# Patient Record
Sex: Female | Born: 1960 | Race: White | Hispanic: No | Marital: Married | Smoking: Never smoker
Health system: Southern US, Community
[De-identification: ages and names within clinical notes are randomized; demographics above are authoritative.]

## PROBLEM LIST (undated history)

## (undated) DIAGNOSIS — M329 Systemic lupus erythematosus, unspecified: Secondary | ICD-10-CM

## (undated) DIAGNOSIS — IMO0002 Reserved for concepts with insufficient information to code with codable children: Secondary | ICD-10-CM

## (undated) DIAGNOSIS — M1990 Unspecified osteoarthritis, unspecified site: Secondary | ICD-10-CM

## (undated) DIAGNOSIS — F329 Major depressive disorder, single episode, unspecified: Secondary | ICD-10-CM

## (undated) DIAGNOSIS — F419 Anxiety disorder, unspecified: Secondary | ICD-10-CM

## (undated) DIAGNOSIS — A419 Sepsis, unspecified organism: Secondary | ICD-10-CM

## (undated) DIAGNOSIS — E119 Type 2 diabetes mellitus without complications: Secondary | ICD-10-CM

## (undated) DIAGNOSIS — F32A Depression, unspecified: Secondary | ICD-10-CM

## (undated) DIAGNOSIS — Z87442 Personal history of urinary calculi: Secondary | ICD-10-CM

## (undated) DIAGNOSIS — U071 COVID-19: Secondary | ICD-10-CM

## (undated) DIAGNOSIS — T7840XA Allergy, unspecified, initial encounter: Secondary | ICD-10-CM

## (undated) DIAGNOSIS — J45909 Unspecified asthma, uncomplicated: Secondary | ICD-10-CM

## (undated) HISTORY — DX: Allergy, unspecified, initial encounter: T78.40XA

## (undated) HISTORY — DX: Unspecified osteoarthritis, unspecified site: M19.90

---
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
## 1990-12-03 HISTORY — PX: APPENDECTOMY: SHX54

## 2000-02-05 ENCOUNTER — Other Ambulatory Visit: Admission: RE | Admit: 2000-02-05 | Discharge: 2000-02-05 | Payer: Self-pay | Admitting: Obstetrics & Gynecology

## 2000-04-05 ENCOUNTER — Encounter: Payer: Self-pay | Admitting: Rheumatology

## 2000-04-05 ENCOUNTER — Encounter: Admission: RE | Admit: 2000-04-05 | Discharge: 2000-04-05 | Payer: Self-pay | Admitting: Rheumatology

## 2000-08-08 ENCOUNTER — Other Ambulatory Visit: Admission: RE | Admit: 2000-08-08 | Discharge: 2000-08-08 | Payer: Self-pay | Admitting: Obstetrics & Gynecology

## 2001-04-15 ENCOUNTER — Other Ambulatory Visit: Admission: RE | Admit: 2001-04-15 | Discharge: 2001-04-15 | Payer: Self-pay | Admitting: Obstetrics & Gynecology

## 2001-05-07 ENCOUNTER — Inpatient Hospital Stay (HOSPITAL_COMMUNITY): Admission: AD | Admit: 2001-05-07 | Discharge: 2001-05-09 | Payer: Self-pay | Admitting: Obstetrics & Gynecology

## 2002-07-14 ENCOUNTER — Encounter: Admission: RE | Admit: 2002-07-14 | Discharge: 2002-07-14 | Payer: Self-pay | Admitting: Obstetrics & Gynecology

## 2002-07-14 ENCOUNTER — Encounter: Payer: Self-pay | Admitting: Obstetrics & Gynecology

## 2002-12-03 HISTORY — PX: ABDOMINAL HYSTERECTOMY: SHX81

## 2003-01-28 ENCOUNTER — Encounter: Admission: RE | Admit: 2003-01-28 | Discharge: 2003-01-28 | Payer: Self-pay | Admitting: Obstetrics & Gynecology

## 2003-01-28 ENCOUNTER — Encounter: Payer: Self-pay | Admitting: Obstetrics & Gynecology

## 2003-09-01 ENCOUNTER — Encounter: Admission: RE | Admit: 2003-09-01 | Discharge: 2003-09-01 | Payer: Self-pay | Admitting: Obstetrics & Gynecology

## 2003-09-01 ENCOUNTER — Encounter: Payer: Self-pay | Admitting: Obstetrics & Gynecology

## 2005-12-03 HISTORY — PX: ROTATOR CUFF REPAIR: SHX139

## 2006-05-02 ENCOUNTER — Ambulatory Visit (HOSPITAL_COMMUNITY): Admission: RE | Admit: 2006-05-02 | Discharge: 2006-05-02 | Payer: Self-pay | Admitting: *Deleted

## 2006-05-07 ENCOUNTER — Encounter: Admission: RE | Admit: 2006-05-07 | Discharge: 2006-05-07 | Payer: Self-pay | Admitting: *Deleted

## 2006-05-17 ENCOUNTER — Ambulatory Visit (HOSPITAL_COMMUNITY): Admission: RE | Admit: 2006-05-17 | Discharge: 2006-05-17 | Payer: Self-pay | Admitting: *Deleted

## 2006-09-19 ENCOUNTER — Encounter: Admission: RE | Admit: 2006-09-19 | Discharge: 2006-12-18 | Payer: Self-pay | Admitting: *Deleted

## 2006-10-08 ENCOUNTER — Ambulatory Visit (HOSPITAL_COMMUNITY): Admission: RE | Admit: 2006-10-08 | Discharge: 2006-10-09 | Payer: Self-pay | Admitting: *Deleted

## 2006-12-31 IMAGING — CR DG CHEST 2V
2 series · 2 of 2 positions shown · non-contrast
Comparison: None.

CLINICAL DATA: Morbid obesity.  Preop radiograph.
 CHEST - 2 VIEW:

[w chest pa *]
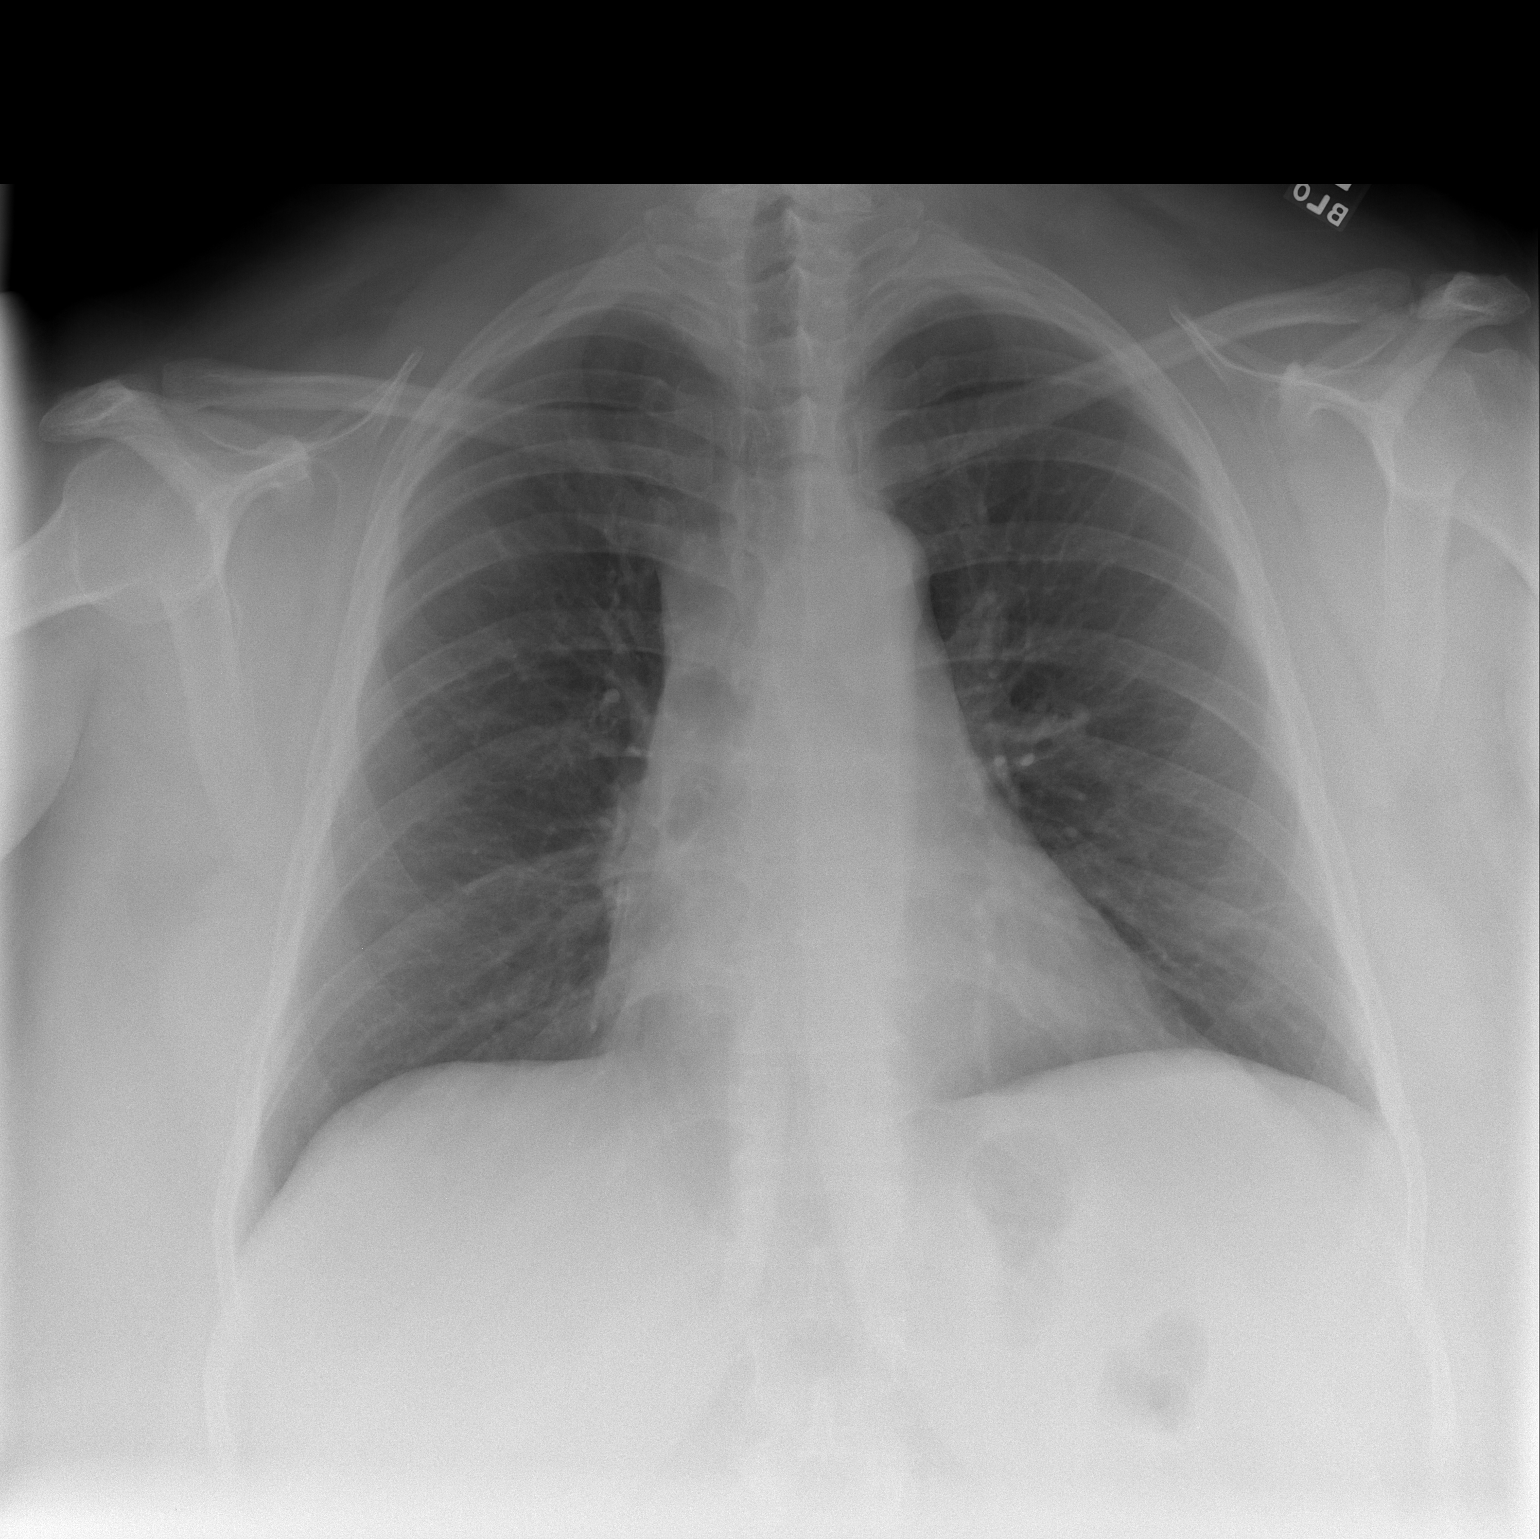

[w chest lat *]
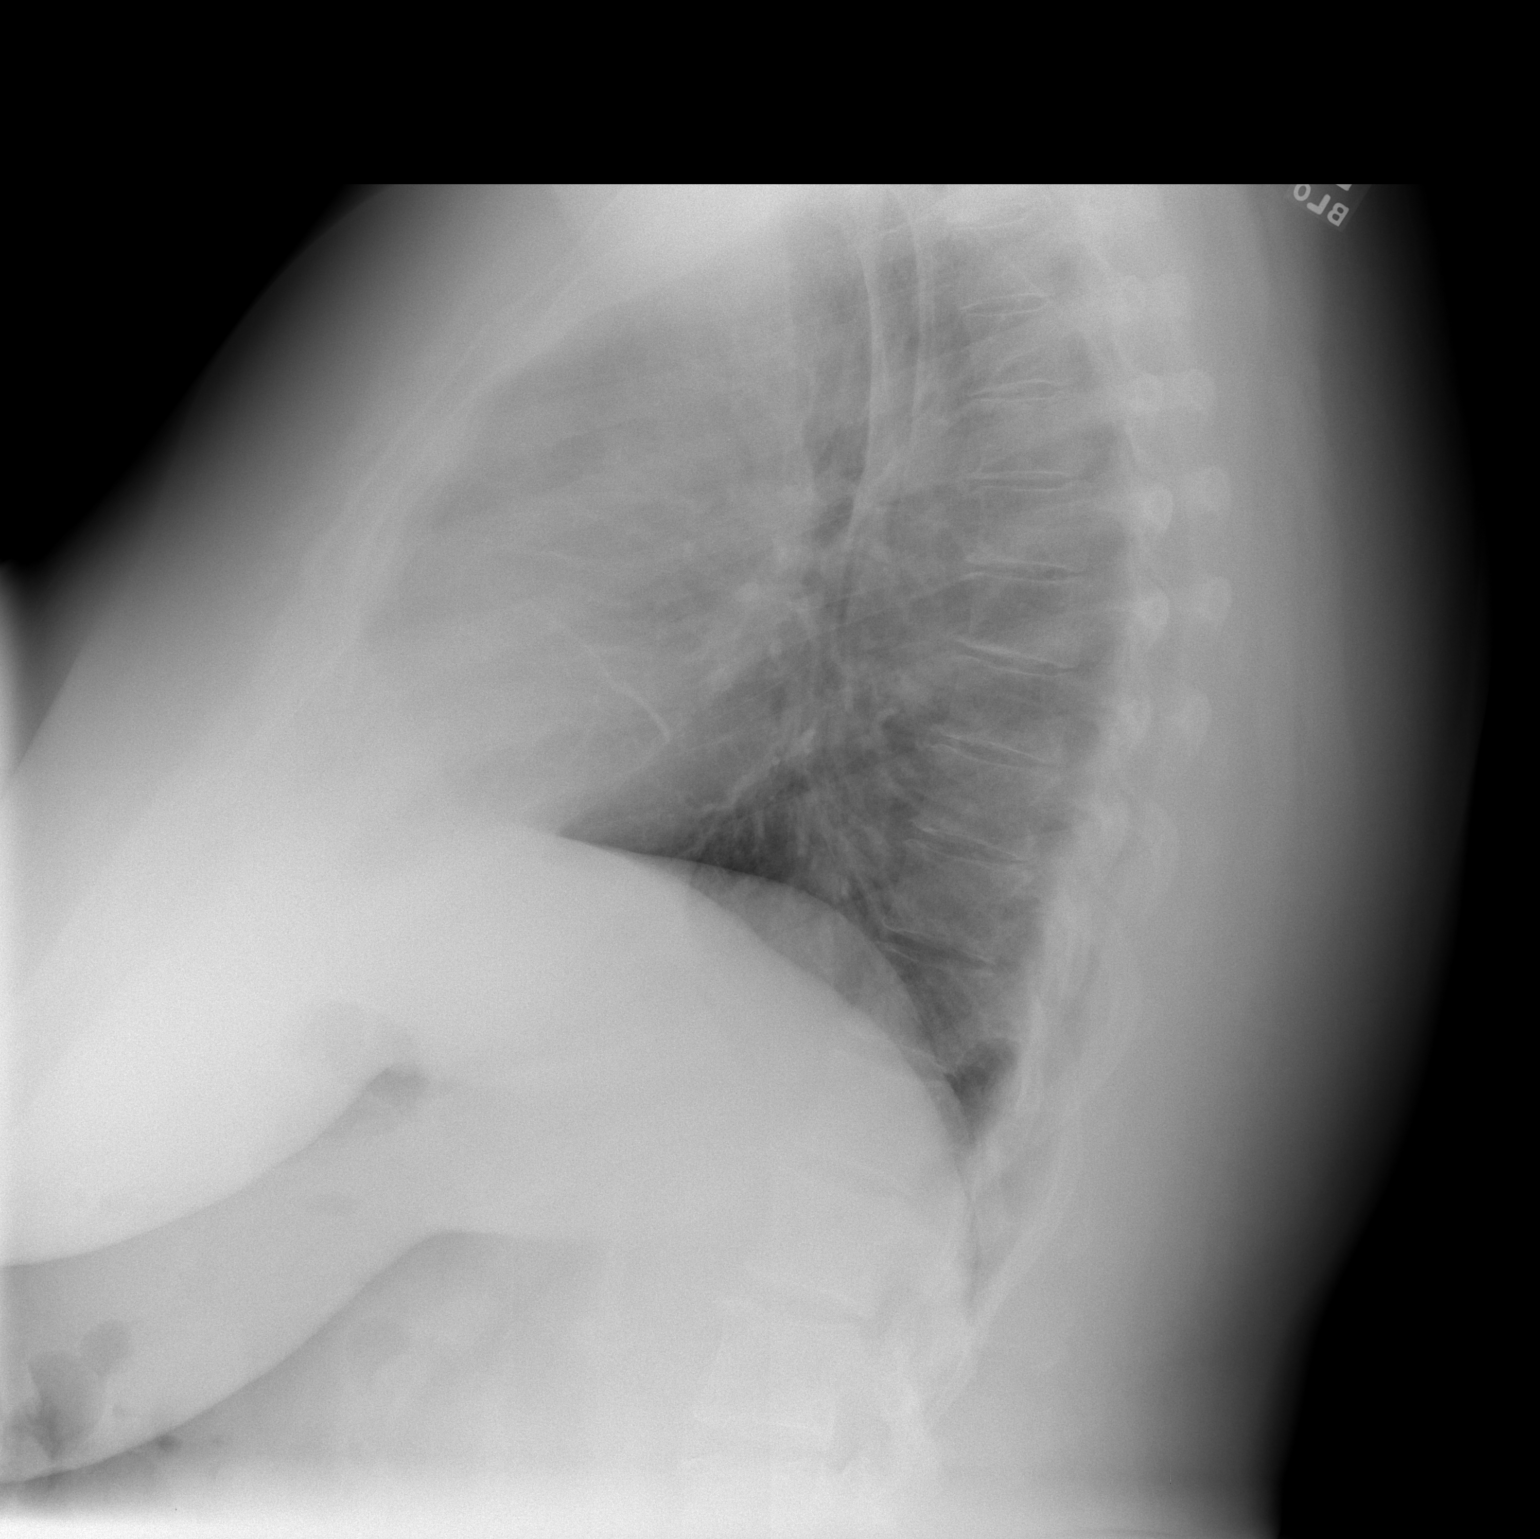

[2 of 2 positions shown; findings below may reference images not displayed]

FINDINGS: The heart size is normal.  There are no effusions.  No interstitial edema.  No airspace opacities are identified.
IMPRESSION: No active cardiopulmonary disease.

## 2007-01-21 ENCOUNTER — Encounter: Admission: RE | Admit: 2007-01-21 | Discharge: 2007-04-21 | Payer: Self-pay | Admitting: *Deleted

## 2012-08-25 ENCOUNTER — Telehealth (INDEPENDENT_AMBULATORY_CARE_PROVIDER_SITE_OTHER): Payer: Self-pay | Admitting: Surgery

## 2012-08-25 NOTE — Telephone Encounter (Signed)
I called the patient per Dr Martin's request concerning the lap band surgery with vagotomy. I left a voicemail asking the patient to return my call @ [phone].cef °

## 2014-08-09 ENCOUNTER — Emergency Department (HOSPITAL_COMMUNITY)
Admission: EM | Admit: 2014-08-09 | Discharge: 2014-08-09 | Disposition: A | Discharge status: Home / Self Care | Payer: BC Managed Care – PPO | Attending: Emergency Medicine | Admitting: Emergency Medicine

## 2014-08-09 ENCOUNTER — Encounter (HOSPITAL_COMMUNITY): Payer: Self-pay | Admitting: Emergency Medicine

## 2014-08-09 DIAGNOSIS — R109 Unspecified abdominal pain: Secondary | ICD-10-CM | POA: Diagnosis not present

## 2014-08-09 DIAGNOSIS — Z9889 Other specified postprocedural states: Secondary | ICD-10-CM | POA: Insufficient documentation

## 2014-08-09 DIAGNOSIS — Z90710 Acquired absence of both cervix and uterus: Secondary | ICD-10-CM | POA: Diagnosis not present

## 2014-08-09 DIAGNOSIS — I10 Essential (primary) hypertension: Secondary | ICD-10-CM | POA: Diagnosis not present

## 2014-08-09 DIAGNOSIS — Z8739 Personal history of other diseases of the musculoskeletal system and connective tissue: Secondary | ICD-10-CM | POA: Diagnosis not present

## 2014-08-09 DIAGNOSIS — Z79899 Other long term (current) drug therapy: Secondary | ICD-10-CM | POA: Diagnosis not present

## 2014-08-09 HISTORY — DX: Systemic lupus erythematosus, unspecified: M32.9

## 2014-08-09 HISTORY — DX: Reserved for concepts with insufficient information to code with codable children: IMO0002

## 2014-08-09 LAB — CBC WITH DIFFERENTIAL/PLATELET
Basophils Absolute: 0 10*3/uL (ref 0.0–0.1)
Basophils Relative: 1 % (ref 0–1)
Eosinophils Absolute: 0.1 10*3/uL (ref 0.0–0.7)
Eosinophils Relative: 2 % (ref 0–5)
HCT: 45.4 % (ref 36.0–46.0)
Hemoglobin: 16.3 g/dL — ABNORMAL HIGH (ref 12.0–15.0)
Lymphocytes Relative: 33 % (ref 12–46)
Lymphs Abs: 2.3 10*3/uL (ref 0.7–4.0)
MCH: 29 pg (ref 26.0–34.0)
MCHC: 35.9 g/dL (ref 30.0–36.0)
MCV: 80.6 fL (ref 78.0–100.0)
Monocytes Absolute: 0.5 10*3/uL (ref 0.1–1.0)
Monocytes Relative: 7 % (ref 3–12)
Neutro Abs: 4 10*3/uL (ref 1.7–7.7)
Neutrophils Relative %: 57 % (ref 43–77)
Platelets: 167 10*3/uL (ref 150–400)
RBC: 5.63 MIL/uL — ABNORMAL HIGH (ref 3.87–5.11)
RDW: 13.1 % (ref 11.5–15.5)
WBC: 7 10*3/uL (ref 4.0–10.5)

## 2014-08-09 LAB — COMPREHENSIVE METABOLIC PANEL
ALT: 61 U/L — ABNORMAL HIGH (ref 0–35)
AST: 52 U/L — ABNORMAL HIGH (ref 0–37)
Albumin: 4.1 g/dL (ref 3.5–5.2)
Alkaline Phosphatase: 77 U/L (ref 39–117)
Anion gap: 18 — ABNORMAL HIGH (ref 5–15)
BUN: 14 mg/dL (ref 6–23)
CO2: 21 mEq/L (ref 19–32)
Calcium: 9.7 mg/dL (ref 8.4–10.5)
Chloride: 97 mEq/L (ref 96–112)
Creatinine, Ser: 0.58 mg/dL (ref 0.50–1.10)
GFR calc Af Amer: >90 mL/min (ref >90)
GFR calc non Af Amer: >90 mL/min (ref >90)
Glucose, Bld: 254 mg/dL — ABNORMAL HIGH (ref 70–99)
Potassium: 4.2 mEq/L (ref 3.7–5.3)
Sodium: 136 mEq/L — ABNORMAL LOW (ref 137–147)
Total Bilirubin: 0.5 mg/dL (ref 0.3–1.2)
Total Protein: 8.5 g/dL — ABNORMAL HIGH (ref 6.0–8.3)

## 2014-08-09 LAB — URINALYSIS, ROUTINE W REFLEX MICROSCOPIC
BILIRUBIN URINE: NEGATIVE
Glucose, UA: >1000 mg/dL — AB
KETONES UR: NEGATIVE mg/dL
Leukocytes, UA: NEGATIVE
NITRITE: NEGATIVE
Protein, ur: NEGATIVE mg/dL
SPECIFIC GRAVITY, URINE: 1.017 (ref 1.005–1.030)
Urobilinogen, UA: 0.2 mg/dL (ref 0.0–1.0)
pH: 6 (ref 5.0–8.0)

## 2014-08-09 LAB — URINE MICROSCOPIC-ADD ON

## 2014-08-09 MED ORDER — HYDROMORPHONE HCL PF 1 MG/ML IJ SOLN
1.0000 mg | Freq: Once | INTRAMUSCULAR | Status: AC
  Administered 2014-08-09: 1 mg via INTRAVENOUS
  Filled 2014-08-09: qty 1

## 2014-08-09 MED ORDER — ONDANSETRON HCL 4 MG/2ML IJ SOLN
4.0000 mg | Freq: Once | INTRAMUSCULAR | Status: AC
  Administered 2014-08-09: 4 mg via INTRAVENOUS
  Filled 2014-08-09: qty 2

## 2014-08-09 MED ORDER — SODIUM CHLORIDE 0.9 % IV SOLN
1000.0000 mL | Freq: Once | INTRAVENOUS | Status: AC
  Administered 2014-08-09: 1000 mL via INTRAVENOUS

## 2014-08-09 MED ORDER — OXYCODONE-ACETAMINOPHEN 5-325 MG PO TABS: 1.0000 | ORAL_TABLET | Freq: Four times a day (QID) | ORAL | Status: DC | PRN

## 2014-08-09 MED ORDER — FENTANYL CITRATE 0.05 MG/ML IJ SOLN
50.0000 ug | Freq: Once | INTRAMUSCULAR | Status: AC
  Administered 2014-08-09: 50 ug via INTRAVENOUS
  Filled 2014-08-09: qty 2

## 2014-08-09 MED ORDER — ONDANSETRON HCL 4 MG PO TABS: 4.0000 mg | ORAL_TABLET | Freq: Four times a day (QID) | ORAL | Status: DC

## 2014-08-09 NOTE — Discharge Instructions (Signed)
Flank Pain Flank pain refers to pain that is located on the side of the body between the upper abdomen and the back. The pain may occur over a short period of time (acute) or may be long-term or reoccurring (chronic). It may be mild or severe. Flank pain can be caused by many things. CAUSES  Some of the more common causes of flank pain include:  Muscle strains.   Muscle spasms.   A disease of your spine (vertebral disk disease).   A lung infection (pneumonia).   Fluid around your lungs (pulmonary edema).   A kidney infection.   Kidney stones.   A very painful skin rash caused by the chickenpox virus (shingles).   Gallbladder disease.  HOME CARE INSTRUCTIONS  Home care will depend on the cause of your pain. In general,  Rest as directed by your caregiver.  Drink enough fluids to keep your urine clear or pale yellow.  Only take over-the-counter or prescription medicines as directed by your caregiver. Some medicines may help relieve the pain.  Tell your caregiver about any changes in your pain.  Follow up with your caregiver as directed. SEEK IMMEDIATE MEDICAL CARE IF:   Your pain is not controlled with medicine.   You have new or worsening symptoms.  Your pain increases.   You have abdominal pain.   You have shortness of breath.   You have persistent nausea or vomiting.   You have swelling in your abdomen.   You feel faint or pass out.   You have blood in your urine.  You have a fever or persistent symptoms for more than 2-3 days.  You have a fever and your symptoms suddenly get worse. MAKE SURE YOU:   Understand these instructions.  Will watch your condition.  Will get help right away if you are not doing well or get worse. Document Released: 01/10/2006 Document Revised: 08/13/2012 Document Reviewed: 07/03/2012 [facility] Patient Information 2015 [place], [place]. This information is not intended to replace advice given to you by your  health care provider. Make sure you discuss any questions you have with your health care provider.   Hypertension Hypertension is another name for high blood pressure. High blood pressure forces your heart to work harder to pump blood. A blood pressure reading has two numbers, which includes a higher number over a lower number (example: 110/72). HOME CARE   Have your blood pressure rechecked by your doctor.  Only take medicine as told by your doctor. Follow the directions carefully. The medicine does not work as well if you skip doses. Skipping doses also puts you at risk for problems.  Do not smoke.  Monitor your blood pressure at home as told by your doctor. GET HELP IF:  You think you are having a reaction to the medicine you are taking.  You have repeat headaches or feel dizzy.  You have puffiness (swelling) in your ankles.  You have trouble with your vision. GET HELP RIGHT AWAY IF:   You get a very bad headache and are confused.  You feel weak, numb, or faint.  You get chest or belly (abdominal) pain.  You throw up (vomit).  You cannot breathe very well. MAKE SURE YOU:   Understand these instructions.  Will watch your condition.  Will get help right away if you are not doing well or get worse. Document Released: 05/07/2008 Document Revised: 11/24/2013 Document Reviewed: 09/11/2013 [facility] Patient Information 2015 [place], [place]. This information is not intended to replace advice  given to you by your health care provider. Make sure you discuss any questions you have with your health care provider.    Emergency Department Resource Guide 1) Find a Doctor and Pay Out of Pocket Although you won't have to find out who is covered by your insurance plan, it is a good idea to ask around and get recommendations. You will then need to call the office and see if the doctor you have chosen will accept you as a new patient and what types of options they offer for patients who  are self-pay. Some doctors offer discounts or will set up payment plans for their patients who do not have insurance, but you will need to ask so you aren't surprised when you get to your appointment.  2) Contact Your Local Health Department Not all health departments have doctors that can see patients for sick visits, but many do, so it is worth a call to see if yours does. If you don't know where your local health department is, you can check in your phone book. The CDC also has a tool to help you locate your state's health department, and many state websites also have listings of all of their local health departments.  3) Find a Walk-in Clinic If your illness is not likely to be very severe or complicated, you may want to try a walk in clinic. These are popping up all over the country in pharmacies, drugstores, and shopping centers. They're usually staffed by nurse practitioners or physician assistants that have been trained to treat common illnesses and complaints. They're usually fairly quick and inexpensive. However, if you have serious medical issues or chronic medical problems, these are probably not your best option.  No Primary Care Doctor: - Call Health Connect at  [phone] - they can help you locate a primary care doctor that  accepts your insurance, provides certain services, etc. - Physician Referral Service- [phone]  Chronic Pain Problems: Organization         Address  Phone   Notes  [facility]  [phone] Patients need to be referred by their primary care doctor.   Medication Assistance: Organization         Address  Phone   Notes  [facility] [address] [place] [place], [place] [address] [phone] --Must be a resident of [facility] -- Must have NO insurance coverage whatsoever (no Medicaid/ Medicare, etc.) -- The pt. MUST have a primary care doctor that directs their care regularly and follows  them in the community   [facility]ist  [phone]   [employer]  [phone]    Agencies that provide inexpensive medical care: Organization         Address  Phone   Notes  [facility]  [phone]   [facility]    [phone]   [facility] [address] [place], [place] [address] [phone]   [facility] [address] [place], [place] [phone]   Planned Parenthood    [phone]   [facility]    [phone]   Community Health and [facility]  [address], [place] Phone:  [phone], Fax:  [phone] Hours of Operation:  9 am - 6 pm, M-F.  Also accepts Medicaid/Medicare and self-pay.  [facility]  [address], [place] Phone: [phone])  [phone], Fax: [phone]. Hours of Operation:  8:30 am - 5:30 pm, M-F.  Also accepts Medicaid and self-pay.  [facility] [place] [address] [place] Phone: [phone]   [facility] [address] Natasha Bence [place], [place] [phone], Ext. [phone] Mondays & Thursdays: 7-9 AM.  First 15 patients are seen on a first come, first serve basis.    Medicaid-accepting [facility] Providers:  Organization         Address  Phone   Notes  [facility] [address], [place] [phone] Also accepts self-pay patients.  [facility] [address] Laurell Josephs [place], [place]  [phone]   [facility] [address], [place] [phone]   [facility] [address], [place] [phone]   Renaye Rakers [address], [place]   [phone] Only accepts [place] Access [place] patients after they have their name applied to their card.   Self-Pay (no insurance) in [facility]:  Organization         Address  Phone   Notes  Sickle Cell  Patients, [facility] [address] [place], [place] [phone]   [facility] [address] [place], [place] [phone]   [facility] [place]  [address], [place] [phone]   [facility]/Dr. Osei-Bonsu  [address], [place] or [address], [place] [phone] Phone number for both [place] and [place] locations is the same.  [facility] [address] [place] [phone]   [facility] [address], [place] or [address] [phone]   [facility] [address], [place] [phone], phone; [phone], fax Sees patients 1st and 3rd Saturday of every month.  Must not qualify for public or private insurance (i.e. Medicaid, Medicare, [place] Health Choice, Veterans' Benefits)  Household income should be no more than 200% of the poverty level The clinic cannot treat you if you are pregnant or think you are pregnant  Sexually transmitted diseases are not treated at the clinic.    Dental Care: Organization         Address  Phone  Notes  [facility] Department of [facility] [address] [place], [place] [phone] Accepts children up to age 70 who are enrolled in [place] or [place] Health Choice; pregnant women with a Medicaid card; and children who have applied for Medicaid or [place] Health Choice, but were declined, whose parents can pay a reduced fee at time of service.  [facility] of [facility]  [address], [place] [phone] Accepts children up to age 27 who are enrolled in [place] or [place] Health Choice; pregnant women with a Medicaid card; and children who have applied for Medicaid or [place] Health Choice, but were declined, whose parents can pay a reduced fee at time of service.  [facility]  [address] [place], [place] [phone] Patients are seen by appointment only. Walk-ins are not accepted. [facility] will see patients 30 years of age and older. Monday - Tuesday (8am-5pm) Most Wednesdays (8:30-5pm) $30 per visit, cash only  [facility]  [address], [employer]  [place] [phone] Patients are seen by appointment only. Walk-ins are not accepted. [facility] will see patients 24 years of age and older. One Wednesday Evening (Monthly: Volunteer Based).  $30 per visit, cash only  [employer]  [phone] for adults; Children under age 64, call Graduate Pediatric Dentistry at [phone]. Children aged 73-14, please call [phone] to request a pediatric application.  Dental services are provided in all areas of dental care including fillings, crowns and bridges, complete and partial dentures, implants, gum treatment, root canals, and extractions. Preventive care is also provided. Treatment is provided to both adults and children. Patients are selected via a lottery and there is often a waiting list.   [facility] [address] [place]  [phone] [email or web address]   [facility] [address] [place], [place] [phone], Ext. [phone] Second and Fourth Thursday of each month, opens at 6:30 AM; Clinic ends at 9 AM.  Patients are seen on a first-come first-served basis, and a limited number are seen during each clinic.   [facility]  [address] Ether [place], [place] [phone]   Eligibility Requirements You must have lived in [place], [place], or [place] counties for at least the last three months.   You cannot be eligible for state or federal sponsored [place], including [employer], [place], or [employer].   You generally cannot be eligible for healthcare insurance through your employer.    How to apply: Eligibility  screenings are held every Tuesday and Wednesday afternoon from 1:00 pm until 4:00 pm. You do not need an appointment for the interview!  [facility] [address] [place], [place] [phone]   [facility] Health Department  [phone]   [facility] Health Department  [phone]   [facility] Health Department  [phone]    Behavioral Health Resources in the Community: Intensive Outpatient Programs Organization         Address  Phone  Notes  [facility] Services [address] [place], [place] [phone]   [facility] Outpatient [address], [place], [place] [phone]   [facility] [address] [place], [place]  [phone]   [facility] Mental Health [address],  [place], [place] [phone] or [phone]   Substance Abuse Resources Organization         Address  Phone  Notes  [facility]  [phone]   [facility]  [phone]   [facility] [place]  [phone]   Floydene Flock  [phone]   Residential & Outpatient Substance Abuse Program  [phone]   Psychological Services Organization         Address  Phone  Notes  [facility] Behavioral Health  [phone]   [facility] Services  [phone]   [facility] Mental Health [address], [place] [phone] or [phone]    Mobile Crisis Teams Organization         Address  Phone  Notes  [facility], Mobile Crisis Care Unit  [phone]   [facility]  [address] [place], [place] [phone]   Doristine Locks [address] [place] [phone]    Self-Help/Support Groups Organization         Address  Phone             Notes  [facility] [place] - variety of support groups  [phone]- [phone] Call for more information  Narcotics Anonymous (NA), [facility] [address], [employer] [place]  2 meetings at  this location   Residential Sports administrator         Address  Phone  Notes  [facility] [address],    [place]  [phone]   [facility]  [address], [place] [address], [place], [place] [phone]   [facility] [address] [place], [place] [phone] Admissions: 8am-3pm M-F  [facility] [address],    [place], [place] [phone]   [facility] [address] [place], [place], [place] [phone]   [facility] [address],  [place], [place] [phone]   [facility] - Intensive Outpatient [address] Laurell Josephs [address], [place], [place] [phone]   [facility] ([facility] [address] [place].,  [place], [place] [phone] or [phone]   [facility] ([facility]) [address], [place], [place] [phone] Accepts Medicaid  [place] [address]  [place] [phone] Substance Abuse/Addiction Treatment   [facility] Organization         Address  Phone  Notes  [facility]  [phone]   Angie Fava, PhD [address] Ervin Knack [place], [place]   [phone] or [phone]   [facility]   [address] [place], [place] [phone]   [facility] [address] [place], [place] [phone] Insurance/Medicaid/sponsorship through [facility] and Families [address]                                    [place], [place] [phone] Therapy/tele-psych/case  [facility] [address], [place] [phone]    Dr. Lolly Mustache  [phone]   [facility]  [employer] [facility] Dept. 1) [address] [place] 2) [address], [place] 3)  [address], [place] [phone]  [phone]   [facility] Child Abuse Hotline [phone])803-4176 or [phone] (After Hours)

## 2014-08-09 NOTE — ED Provider Notes (Signed)
CSN: [id number]     Arrival date & time 08/09/14  0535 History   First MD Initiated Contact with Patient 08/09/14 [phone]     Chief Complaint  Patient presents with  . Flank Pain     (Consider location/radiation/quality/duration/timing/severity/associated sxs/prior Treatment) HPI Comments: Patient is a 53 year old female with history of lupus and kidney stones who presents to the ED today for evaluation of sudden onset of left-sided flank pain. She reports that the pain began around 2 AM. The pain starts in her left back and radiates into her left lower quadrant. It is a sharp, squeezing, twisting pain. She has not taken any medications to improve her pain. She has history of kidney stones, but patient is not in as much pain for her prior kidney stone. She denies any urinary symptoms including burning, urgency, frequency. She denies any history of documented hypertension. She states "I have very bad white coat hypertension". No headache, visual disturbance. She denies any history of diabetes.  Patient is a 53 y.o. female presenting with flank pain. The history is provided by the patient. No language interpreter was used.  Flank Pain Associated symptoms include abdominal pain. Pertinent negatives include no chest pain, chills, fever, headaches, nausea or vomiting.    Past Medical History  Diagnosis Date  . Lupus    History reviewed. No pertinent past surgical history. History reviewed. No pertinent family history. History  Substance Use Topics  . Smoking status: Never Smoker   . Smokeless tobacco: Not on file  . Alcohol Use: No   OB History   Grav Para Term Preterm Abortions TAB SAB Ect Mult Living                 Review of Systems  Constitutional: Negative for fever and chills.  Respiratory: Negative for shortness of breath.   Cardiovascular: Negative for chest pain.  Gastrointestinal: Positive for abdominal pain. Negative for nausea, vomiting and diarrhea.  Genitourinary: Positive  for flank pain. Negative for dysuria, vaginal bleeding, vaginal discharge and vaginal pain.  Neurological: Negative for dizziness, light-headedness and headaches.  All other systems reviewed and are negative.     Allergies  Codeine and Vibramycin  Home Medications   Prior to Admission medications   Medication Sig Start Date End Date Taking? Authorizing Provider  ALPRAZolam Prudy Feeler) 0.5 MG tablet Take 0.5 mg by mouth at bedtime as needed for anxiety.   Yes Historical Provider, MD  cetirizine (ZYRTEC) 10 MG tablet Take 10 mg by mouth daily.   Yes Historical Provider, MD  sertraline (ZOLOFT) 100 MG tablet Take 100 mg by mouth daily.   Yes Historical Provider, MD   BP 149/85  Pulse 91  Temp(Src) 98.2 F (36.8 C) (Oral)  Resp 18  SpO2 99% Physical Exam  Nursing note and vitals reviewed. Constitutional: She is oriented to person, place, and time. She appears well-developed and well-nourished. She appears distressed.  Patient very uncomfortable in appearance  HENT:  Head: Normocephalic and atraumatic.  Right Ear: External ear normal.  Left Ear: External ear normal.  Nose: Nose normal.  Mouth/Throat: Oropharynx is clear and moist.  Eyes: Conjunctivae are normal.  Neck: Normal range of motion.  Cardiovascular: Normal rate, regular rhythm and normal heart sounds.   Pulmonary/Chest: Effort normal and breath sounds normal. No stridor. No respiratory distress. She has no wheezes. She has no rales.  Abdominal: Soft. She exhibits no distension. There is no tenderness. There is no rigidity, no guarding and no CVA tenderness.  Musculoskeletal: Normal range of motion.  Minimal tenderness to palpation over left flank. No rashes or lesions.   Neurological: She is alert and oriented to person, place, and time. She has normal strength.  Skin: Skin is warm and dry. She is not diaphoretic. No erythema.  Psychiatric: She has a normal mood and affect. Her behavior is normal.    ED Course   Procedures (including critical care time) Labs Review Labs Reviewed  URINALYSIS, ROUTINE W REFLEX MICROSCOPIC - Abnormal; Notable for the following:    Glucose, UA >1000 (*)    Hgb urine dipstick LARGE (*)    All other components within normal limits  COMPREHENSIVE METABOLIC PANEL - Abnormal; Notable for the following:    Sodium 136 (*)    Glucose, Bld 254 (*)    Total Protein 8.5 (*)    AST 52 (*)    ALT 61 (*)    Anion gap 18 (*)    All other components within normal limits  CBC WITH DIFFERENTIAL - Abnormal; Notable for the following:    RBC 5.63 (*)    Hemoglobin 16.3 (*)    All other components within normal limits  URINE MICROSCOPIC-ADD ON    Imaging Review No results found.   EKG Interpretation None      MDM   Final diagnoses:  Left flank pain  Essential hypertension    Patient presents emergency Department with sudden onset, severe left flank pain. Large amount of hemoglobin in urine. Patient feels significantly improved after IV narcotics. Patient has history of kidney stones. My clinical suspicion is the patient has passed a stone. Patient was offered imaging which she declined at this time. Patient was glucose of 254. Patient will followup with primary care physician about hypertension and diabetes. Patient understands importance of doing this. Discussed reasons to return to emergency Department immediately. Vital signs stable for discharge. Discussed case with Dr. Juleen [place] who agrees with plan. Patient / Family / Caregiver informed of clinical course, understand medical decision-making process, and agree with plan.    Mora Bellman, PA-C 08/09/14 [phone]

## 2014-08-09 NOTE — ED Notes (Signed)
Unable to urinate at this time.  

## 2014-08-09 NOTE — ED Notes (Signed)
Pt unable to void at this time. 

## 2014-08-09 NOTE — ED Notes (Signed)
Pt arrived to the ED with a complaint of left sided abdominal pain.  Pt was awoke at 0200 with severe pain that sometimes radiates to the lower left abdomen.

## 2014-08-10 ENCOUNTER — Emergency Department (HOSPITAL_COMMUNITY)
Admission: EM | Admit: 2014-08-10 | Discharge: 2014-08-11 | Disposition: A | Discharge status: Home / Self Care | Payer: BC Managed Care – PPO | Attending: Emergency Medicine | Admitting: Emergency Medicine

## 2014-08-10 ENCOUNTER — Encounter (HOSPITAL_COMMUNITY): Payer: Self-pay | Admitting: Emergency Medicine

## 2014-08-10 ENCOUNTER — Emergency Department (HOSPITAL_COMMUNITY): Payer: BC Managed Care – PPO

## 2014-08-10 DIAGNOSIS — Z8739 Personal history of other diseases of the musculoskeletal system and connective tissue: Secondary | ICD-10-CM | POA: Insufficient documentation

## 2014-08-10 DIAGNOSIS — N209 Urinary calculus, unspecified: Secondary | ICD-10-CM

## 2014-08-10 DIAGNOSIS — R109 Unspecified abdominal pain: Secondary | ICD-10-CM | POA: Insufficient documentation

## 2014-08-10 DIAGNOSIS — Z79899 Other long term (current) drug therapy: Secondary | ICD-10-CM | POA: Insufficient documentation

## 2014-08-10 LAB — URINE MICROSCOPIC-ADD ON

## 2014-08-10 LAB — CBC WITH DIFFERENTIAL/PLATELET
BASOS ABS: 0 10*3/uL (ref 0.0–0.1)
Basophils Relative: 0 % (ref 0–1)
EOS PCT: 1 % (ref 0–5)
Eosinophils Absolute: 0.1 10*3/uL (ref 0.0–0.7)
HCT: 44.2 % (ref 36.0–46.0)
HEMOGLOBIN: 15.4 g/dL — AB (ref 12.0–15.0)
LYMPHS ABS: 2.1 10*3/uL (ref 0.7–4.0)
LYMPHS PCT: 24 % (ref 12–46)
MCH: 28.6 pg (ref 26.0–34.0)
MCHC: 34.8 g/dL (ref 30.0–36.0)
MCV: 82.2 fL (ref 78.0–100.0)
MONO ABS: 0.5 10*3/uL (ref 0.1–1.0)
Monocytes Relative: 6 % (ref 3–12)
NEUTROS ABS: 6 10*3/uL (ref 1.7–7.7)
NEUTROS PCT: 69 % (ref 43–77)
PLATELETS: 174 10*3/uL (ref 150–400)
RBC: 5.38 MIL/uL — ABNORMAL HIGH (ref 3.87–5.11)
RDW: 13.1 % (ref 11.5–15.5)
WBC: 8.7 10*3/uL (ref 4.0–10.5)

## 2014-08-10 LAB — URINALYSIS, ROUTINE W REFLEX MICROSCOPIC
Bilirubin Urine: NEGATIVE
Glucose, UA: 100 mg/dL — AB
Ketones, ur: NEGATIVE mg/dL
Nitrite: NEGATIVE
Protein, ur: NEGATIVE mg/dL
Specific Gravity, Urine: 1.019 (ref 1.005–1.030)
Urobilinogen, UA: 1 mg/dL (ref 0.0–1.0)
pH: 5.5 (ref 5.0–8.0)

## 2014-08-10 MED ORDER — ONDANSETRON 4 MG PO TBDP
4.0000 mg | ORAL_TABLET | Freq: Once | ORAL | Status: AC
  Administered 2014-08-10: 4 mg via ORAL
  Filled 2014-08-10: qty 1

## 2014-08-10 MED ORDER — HYDROMORPHONE HCL PF 1 MG/ML IJ SOLN
1.0000 mg | Freq: Once | INTRAMUSCULAR | Status: AC
  Administered 2014-08-11: 1 mg via INTRAVENOUS
  Filled 2014-08-10: qty 1

## 2014-08-10 MED ORDER — HYDROMORPHONE HCL PF 1 MG/ML IJ SOLN
1.0000 mg | Freq: Once | INTRAMUSCULAR | Status: AC
  Administered 2014-08-10: 1 mg via INTRAVENOUS
  Filled 2014-08-10: qty 1

## 2014-08-10 NOTE — ED Notes (Signed)
Left flank pain 9/10

## 2014-08-10 NOTE — ED Notes (Signed)
After speaking further with pt and reviewing past chart,  I do not see kidney stone diagnosis and pt now states she wasn't scanned,

## 2014-08-10 NOTE — ED Notes (Signed)
Pt was seen here yesterday and diagnosed with kidney stones and given pain medication via IV, pt states she is now in uncontrollable pain after taking prescribed pain medication so she has returned for further care,  Pain is on left side 9/10

## 2014-08-10 NOTE — ED Provider Notes (Signed)
CSN: [id number]     Arrival date & time 08/10/14  2025 History   First MD Initiated Contact with Patient 08/10/14 2217     Chief Complaint  Patient presents with  . Flank Pain     (Consider location/radiation/quality/duration/timing/severity/associated sxs/prior Treatment) Patient is a 53 y.o. female presenting with flank pain. The history is provided by the patient. No language interpreter was used.  Flank Pain This is a new problem. The current episode started yesterday. The problem occurs constantly. The problem has been gradually worsening. Associated symptoms include abdominal pain. Nothing aggravates the symptoms. She has tried nothing for the symptoms. The treatment provided no relief.  Pt seen here 9/7 am with flank pain.  Pt was felt to have a kidney stone and was treated with pain medication and given percocet at home.  No relief with percocet today. Pt reports increased pain  Past Medical History  Diagnosis Date  . Lupus    History reviewed. No pertinent past surgical history. History reviewed. No pertinent family history. History  Substance Use Topics  . Smoking status: Never Smoker   . Smokeless tobacco: Not on file  . Alcohol Use: No   OB History   Grav Para Term Preterm Abortions TAB SAB Ect Mult Living                 Review of Systems  Gastrointestinal: Positive for abdominal pain.  Genitourinary: Positive for flank pain.  All other systems reviewed and are negative.     Allergies  Codeine and Vibramycin  Home Medications   Prior to Admission medications   Medication Sig Start Date End Date Taking? Authorizing Provider  ALPRAZolam Prudy Feeler) 0.5 MG tablet Take 0.5 mg by mouth at bedtime as needed for anxiety.   Yes Historical Provider, MD  cetirizine (ZYRTEC) 10 MG tablet Take 10 mg by mouth daily.   Yes Historical Provider, MD  ondansetron (ZOFRAN) 4 MG tablet Take 1 tablet (4 mg total) by mouth every 6 (six) hours. 08/09/14  Yes Mora Bellman, PA-C   oxyCODONE-acetaminophen (PERCOCET/ROXICET) 5-325 MG per tablet Take 1-2 tablets by mouth every 6 (six) hours as needed for moderate pain or severe pain. 08/09/14  Yes Mora Bellman, PA-C  sertraline (ZOLOFT) 100 MG tablet Take 100 mg by mouth daily.   Yes Historical Provider, MD   BP 180/96  Pulse 88  Temp(Src) 98.3 F (36.8 C) (Oral)  Resp 20  SpO2 96% Physical Exam  Nursing note and vitals reviewed. Constitutional: She is oriented to person, place, and time. She appears well-developed and well-nourished.  HENT:  Head: Normocephalic and atraumatic.  Eyes: Conjunctivae and EOM are normal. Pupils are equal, round, and reactive to light.  Neck: Normal range of motion. Neck supple.  Cardiovascular: Normal rate, normal heart sounds and intact distal pulses.   Pulmonary/Chest: Effort normal and breath sounds normal.  Abdominal: Soft. There is no tenderness.  Musculoskeletal: Normal range of motion.  Neurological: She is alert and oriented to person, place, and time. She has normal reflexes.  Skin: Skin is warm.  Psychiatric: She has a normal mood and affect.    ED Course  Procedures (including critical care time) Labs Review Labs Reviewed  URINALYSIS, ROUTINE W REFLEX MICROSCOPIC    Imaging Review Ct Abdomen Pelvis Wo Contrast  08/10/2014   CLINICAL DATA:  Left flank pain, history of renal stone  EXAM: CT ABDOMEN AND PELVIS WITHOUT CONTRAST  TECHNIQUE: Multidetector CT imaging of the abdomen and pelvis was  performed following the standard protocol without IV contrast.  COMPARISON:  None.  FINDINGS: Mild linear scarring/atelectasis at the lung bases.  Laparoscopic band in satisfactory position.  Unenhanced liver, spleen, pancreas, and adrenal glands within normal limits.  Layering gallstones with gallbladder sludge. No intrahepatic or extrahepatic ductal dilatation.  2 mm nonobstructing right lower pole renal calculus (Series 2/ image 43). Two nonobstructing left lower pole renal  calculi measuring up 4 mm (series 2/ image 43). Mild left hydroureteronephrosis with a 7 mm proximal left ureteral calculus (coronal image 64).  No evidence of bowel obstruction. Prior appendectomy. Colonic diverticulosis, without associated inflammatory changes.  No evidence of abdominal aortic aneurysm.  No abdominopelvic ascites.  No suspicious abdominopelvic lymphadenopathy.  Status post hysterectomy. Bilateral ovaries are within normal limits.  Bladder is underdistended.  Moderate fat containing periumbilical hernia (series 2/ image 70).  Degenerative changes of the visualized thoracolumbar spine.  IMPRESSION: 7 mm proximal left ureteral calculus with mild left hydroureteronephrosis.  Bilateral nonobstructing renal calculi measuring up to 4 mm on the left.  Additional ancillary findings as above.   Electronically Signed   By: Charline Bills M.D.   On: 08/10/2014 23:04     EKG Interpretation None      MDM  I discussed results with Urologist on call.  He advised to have pt call office.  He advised to give torodol.  He reviewed labs and ct and feels pt may be able to pass stone.  Pt counseled on follow up and agrees with plan   Final diagnoses:  Calculus of upper urinary tract    flomax Continue percocet    Elson Areas, PA-C 08/11/14 0109

## 2014-08-11 ENCOUNTER — Encounter (HOSPITAL_COMMUNITY): Payer: BC Managed Care – PPO | Admitting: Anesthesiology

## 2014-08-11 ENCOUNTER — Encounter (HOSPITAL_COMMUNITY)
Admission: RE | Disposition: A | Discharge status: Home / Self Care | Payer: Self-pay | Source: Ambulatory Visit | Attending: Urology

## 2014-08-11 ENCOUNTER — Other Ambulatory Visit: Payer: Self-pay | Admitting: Urology

## 2014-08-11 ENCOUNTER — Ambulatory Visit (HOSPITAL_COMMUNITY): Payer: BC Managed Care – PPO | Admitting: Anesthesiology

## 2014-08-11 ENCOUNTER — Ambulatory Visit (HOSPITAL_COMMUNITY)
Admission: RE | Admit: 2014-08-11 | Discharge: 2014-08-11 | Disposition: A | Discharge status: Home / Self Care | Payer: BC Managed Care – PPO | Source: Ambulatory Visit | Attending: Urology | Admitting: Urology

## 2014-08-11 ENCOUNTER — Encounter (HOSPITAL_COMMUNITY): Payer: Self-pay | Admitting: *Deleted

## 2014-08-11 ENCOUNTER — Ambulatory Visit (HOSPITAL_COMMUNITY): Payer: BC Managed Care – PPO

## 2014-08-11 DIAGNOSIS — J45909 Unspecified asthma, uncomplicated: Secondary | ICD-10-CM | POA: Insufficient documentation

## 2014-08-11 DIAGNOSIS — N200 Calculus of kidney: Secondary | ICD-10-CM | POA: Diagnosis not present

## 2014-08-11 DIAGNOSIS — K219 Gastro-esophageal reflux disease without esophagitis: Secondary | ICD-10-CM | POA: Diagnosis not present

## 2014-08-11 DIAGNOSIS — F411 Generalized anxiety disorder: Secondary | ICD-10-CM | POA: Diagnosis not present

## 2014-08-11 DIAGNOSIS — F329 Major depressive disorder, single episode, unspecified: Secondary | ICD-10-CM | POA: Diagnosis not present

## 2014-08-11 DIAGNOSIS — M329 Systemic lupus erythematosus, unspecified: Secondary | ICD-10-CM | POA: Insufficient documentation

## 2014-08-11 DIAGNOSIS — N201 Calculus of ureter: Secondary | ICD-10-CM | POA: Diagnosis present

## 2014-08-11 DIAGNOSIS — Z881 Allergy status to other antibiotic agents status: Secondary | ICD-10-CM | POA: Diagnosis not present

## 2014-08-11 DIAGNOSIS — Z885 Allergy status to narcotic agent status: Secondary | ICD-10-CM | POA: Diagnosis not present

## 2014-08-11 DIAGNOSIS — Z79899 Other long term (current) drug therapy: Secondary | ICD-10-CM | POA: Diagnosis not present

## 2014-08-11 DIAGNOSIS — F3289 Other specified depressive episodes: Secondary | ICD-10-CM | POA: Diagnosis not present

## 2014-08-11 HISTORY — DX: Major depressive disorder, single episode, unspecified: F32.9

## 2014-08-11 HISTORY — DX: Depression, unspecified: F32.A

## 2014-08-11 HISTORY — DX: Unspecified asthma, uncomplicated: J45.909

## 2014-08-11 HISTORY — PX: CYSTOSCOPY WITH RETROGRADE PYELOGRAM, URETEROSCOPY AND STENT PLACEMENT: SHX5789

## 2014-08-11 HISTORY — DX: Anxiety disorder, unspecified: F41.9

## 2014-08-11 HISTORY — DX: Personal history of urinary calculi: Z87.442

## 2014-08-11 LAB — BASIC METABOLIC PANEL
Anion gap: 11 (ref 5–15)
Anion gap: 13 (ref 5–15)
BUN: 12 mg/dL (ref 6–23)
BUN: 13 mg/dL (ref 6–23)
CALCIUM: 9.5 mg/dL (ref 8.4–10.5)
CHLORIDE: 98 meq/L (ref 96–112)
CO2: 27 meq/L (ref 19–32)
CO2: 28 mEq/L (ref 19–32)
Calcium: 9.8 mg/dL (ref 8.4–10.5)
Chloride: 95 mEq/L — ABNORMAL LOW (ref 96–112)
Creatinine, Ser: 0.64 mg/dL (ref 0.50–1.10)
Creatinine, Ser: 0.71 mg/dL (ref 0.50–1.10)
GFR calc Af Amer: >90 mL/min (ref >90)
GLUCOSE: 140 mg/dL — AB (ref 70–99)
Glucose, Bld: 137 mg/dL — ABNORMAL HIGH (ref 70–99)
Potassium: 3.8 mEq/L (ref 3.7–5.3)
Potassium: 4 mEq/L (ref 3.7–5.3)
SODIUM: 134 meq/L — AB (ref 137–147)
Sodium: 138 mEq/L (ref 137–147)

## 2014-08-11 SURGERY — CYSTOURETEROSCOPY, WITH RETROGRADE PYELOGRAM AND STENT INSERTION
Anesthesia: General | Site: Ureter | Laterality: Left

## 2014-08-11 MED ORDER — SODIUM CHLORIDE 0.9 % IJ SOLN
INTRAMUSCULAR | Status: AC
  Filled 2014-08-11: qty 10

## 2014-08-11 MED ORDER — MIDAZOLAM HCL 2 MG/2ML IJ SOLN
INTRAMUSCULAR | Status: AC
  Filled 2014-08-11: qty 2

## 2014-08-11 MED ORDER — CIPROFLOXACIN IN D5W 400 MG/200ML IV SOLN
400.0000 mg | INTRAVENOUS | Status: AC
  Administered 2014-08-11: 400 mg via INTRAVENOUS

## 2014-08-11 MED ORDER — MEPERIDINE HCL 50 MG/ML IJ SOLN: 6.2500 mg | INTRAMUSCULAR | Status: DC | PRN

## 2014-08-11 MED ORDER — IBUPROFEN 600 MG PO TABS: 600.0000 mg | ORAL_TABLET | Freq: Three times a day (TID) | ORAL | Status: DC | PRN

## 2014-08-11 MED ORDER — LACTATED RINGERS IV SOLN
INTRAVENOUS | Status: DC | PRN
  Administered 2014-08-11: 17:00:00 via INTRAVENOUS

## 2014-08-11 MED ORDER — PROPOFOL 10 MG/ML IV BOLUS
INTRAVENOUS | Status: DC | PRN
  Administered 2014-08-11: 150 mg via INTRAVENOUS

## 2014-08-11 MED ORDER — TAMSULOSIN HCL 0.4 MG PO CAPS: 0.4000 mg | ORAL_CAPSULE | Freq: Every day | ORAL | Status: DC

## 2014-08-11 MED ORDER — FENTANYL CITRATE 0.05 MG/ML IJ SOLN
INTRAMUSCULAR | Status: AC
  Filled 2014-08-11: qty 2

## 2014-08-11 MED ORDER — LACTATED RINGERS IV SOLN: INTRAVENOUS | Status: DC

## 2014-08-11 MED ORDER — LACTATED RINGERS IV SOLN
INTRAVENOUS | Status: DC
  Administered 2014-08-11: 19:00:00 via INTRAVENOUS

## 2014-08-11 MED ORDER — OXYCODONE-ACETAMINOPHEN 5-325 MG PO TABS: 1.0000 | ORAL_TABLET | ORAL | Status: DC | PRN

## 2014-08-11 MED ORDER — IOHEXOL 300 MG/ML  SOLN
INTRAMUSCULAR | Status: DC | PRN
  Administered 2014-08-11: 5 mL

## 2014-08-11 MED ORDER — CIPROFLOXACIN IN D5W 400 MG/200ML IV SOLN
INTRAVENOUS | Status: AC
  Filled 2014-08-11: qty 200

## 2014-08-11 MED ORDER — FENTANYL CITRATE 0.05 MG/ML IJ SOLN
INTRAMUSCULAR | Status: DC | PRN
  Administered 2014-08-11: 50 ug via INTRAVENOUS

## 2014-08-11 MED ORDER — METOCLOPRAMIDE HCL 5 MG/ML IJ SOLN: 10.0000 mg | Freq: Once | INTRAMUSCULAR | Status: AC | PRN

## 2014-08-11 MED ORDER — FENTANYL CITRATE 0.05 MG/ML IJ SOLN: 25.0000 ug | INTRAMUSCULAR | Status: DC | PRN

## 2014-08-11 MED ORDER — MIDAZOLAM HCL 5 MG/5ML IJ SOLN
INTRAMUSCULAR | Status: DC | PRN
  Administered 2014-08-11: 2 mg via INTRAVENOUS

## 2014-08-11 MED ORDER — PROPOFOL 10 MG/ML IV BOLUS
INTRAVENOUS | Status: AC
  Filled 2014-08-11: qty 20

## 2014-08-11 SURGICAL SUPPLY — 16 items
BAG URO CATCHER STRL LF (DRAPE) ×3 IMPLANT
BASKET ZERO TIP NITINOL 2.4FR (BASKET) IMPLANT
BSKT STON RTRVL ZERO TP 2.4FR (BASKET)
CATH INTERMIT  6FR 70CM (CATHETERS) IMPLANT
CLOTH BEACON ORANGE TIMEOUT ST (SAFETY) ×3 IMPLANT
DRAPE CAMERA CLOSED 9X96 (DRAPES) ×3 IMPLANT
GLOVE BIOGEL M STRL SZ7.5 (GLOVE) ×3 IMPLANT
GOWN STRL REUS W/TWL LRG LVL3 (GOWN DISPOSABLE) ×6 IMPLANT
GUIDEWIRE ANG ZIPWIRE 038X150 (WIRE) IMPLANT
GUIDEWIRE STR DUAL SENSOR (WIRE) ×3 IMPLANT
MANIFOLD NEPTUNE II (INSTRUMENTS) ×3 IMPLANT
PACK CYSTO (CUSTOM PROCEDURE TRAY) ×3 IMPLANT
SHEATH ACCESS URETERAL 38CM (SHEATH) ×2 IMPLANT
STENT CONTOUR 6FRX24X.038 (STENTS) ×2 IMPLANT
TUBING CONNECTING 10 (TUBING) ×2 IMPLANT
TUBING CONNECTING 10' (TUBING) ×1

## 2014-08-11 NOTE — H&P (Signed)
Chief Complaint Kidney stone   History of Present Illness Ms. Nott is a 53 year old female who developed the acute onset of severe left-sided flank pain 2 days ago. This was associated with nausea and vomiting and caused her to present to the emergency department. She was treated for pain and discharged and subsequently presented again and underwent a CT scan which confirmed a 7 mm proximal left ureteral calculus with hydronephrosis. She was also noted to have 2 renal calculi on each side that were nonobstructing. She has denied any fever or hematuria. She states that her pain has remained significant although has been controlled with oral Percocet. She has had a difficult time been able tolerate anything by mouth.     She has 1 prior episode of a kidney stone in 1998 which she passed spontaneously.   Past Medical History Problems  1. History of Anxiety (300.00) 2. History of arthritis (V13.4) 3. History of asthma (V12.69) 4. History of depression (V11.8) 5. History of esophageal reflux (V12.79) 6. History of systemic lupus erythematosus (SLE) (V12.29)  Surgical History Problems  1. History of Appendectomy 2. History of Cesarean Section 3. 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 History of Tonsillectomy  Current Meds 1. ALPRAZolam 0.5 MG Oral Tablet;  Therapy: (Recorded:09Sep2015) to Recorded 2. Cetirizine HCl - 10 MG Oral Tablet;  Therapy: (Recorded:09Sep2015) to Recorded 3. Ondansetron 4 MG Oral Tablet Dispersible;  Therapy: (Recorded:09Sep2015) to Recorded 4. Oxycodone-Acetaminophen 10-325 MG Oral Tablet;  Therapy: (Recorded:09Sep2015) to Recorded 5. Sertraline HCl - 100 MG Oral Tablet;  Therapy: (Recorded:09Sep2015) to Recorded  Allergies Medication  1. Codeine Derivatives 2. Vibramycin CAPS  Family History Problems  1. Family history of cardiac disorder (V17.49) 2. Family history of diabetes mellitus (V18.0) 3. Family history of  No significant past medical history : Mother  Social History Problems    Denied: History of Alcohol use   Married   Never a smoker  Review of Systems Genitourinary, constitutional, skin, eye, otolaryngeal, hematologic/lymphatic, cardiovascular, pulmonary, endocrine, musculoskeletal, gastrointestinal, neurological and psychiatric system(s) were reviewed and pertinent findings if present are noted.  Genitourinary: no hematuria.  Gastrointestinal: nausea and vomiting.    Vitals Vital Signs [Data Includes: Last 1 Day]  Recorded: 09Sep2015 11:19AM  Height: 5 ft 4 in Weight: 216 lb  BMI Calculated: 37.08 BSA Calculated: 2.02 Blood Pressure: 128 / 83 Temperature: 99 F Heart Rate: 112  Physical Exam Constitutional: Well nourished and well developed . No acute distress.  ENT:. The ears and nose are normal in appearance.  Neck: The appearance of the neck is normal and no neck mass is present.  Pulmonary: No respiratory distress, normal respiratory rhythm and effort and clear bilateral breath sounds.  Cardiovascular: Heart rate and rhythm are normal . No peripheral edema.  Abdomen: The abdomen is obese. The abdomen is soft and nontender. No masses are palpated. No CVA tenderness. No hernias are palpable. No hepatosplenomegaly noted.  Lymphatics: The femoral and inguinal nodes are not enlarged or tender.  Skin: Normal skin turgor, no visible rash and no visible skin lesions.  Neuro/Psych:. Mood and affect are appropriate.    Results/Data Urine [Data Includes: Last 1 Day]   09Sep2015  COLOR AMBER   APPEARANCE CLEAR   SPECIFIC GRAVITY 1.020   pH 5.0   GLUCOSE 250 mg/dL  BILIRUBIN SMALL   KETONE 15 mg/dL  BLOOD LARGE   PROTEIN 30 mg/dL  UROBILINOGEN 1 mg/dL  NITRITE NEG   LEUKOCYTE ESTERASE NEG   SQUAMOUS  EPITHELIAL/HPF RARE   WBC 3-6 WBC/hpf  RBC 3-6 RBC/hpf  BACTERIA NONE SEEN   CRYSTALS NONE SEEN   CASTS NONE SEEN    I have individually reviewed her medical records,  labs, and CT scan from 08/10/14. Her serum creatinine 0.71, white blood count 8.7. I independently reviewed her CT scan with findings as stated above.   Assessment Assessed  1. Ureteral stone with hydronephrosis (592.1,591) 2. Renal calculi (592.0)  Plan Health Maintenance  1. UA With REFLEX; [Do Not Release]; Status:Complete;   Done: 09Sep2015 11:07AM  Discussion/Summary 1. 7 mm proximal left ureteral calculus: She still remains significantly symptomatic and would like to proceed with treatment. We reviewed options for therapy including her cutaneous treatment, ureteroscopic laser lithotripsy, and shockwave lithotripsy in the pros and cons of each approach. She does wish to proceed with ureteroscopic laser lithotripsy today and we reviewed the potential risks and complications of this procedure in detail as well as the expected recovery process and need for a postoperative ureteral stent. She gives her informed consent to proceed.    2. Renal calculi: Since we are going to treat her ureteral calculus, we also will plan to treat her left renal calculi today if possible. She does have 2 additional nonobstructing small right renal calculi. I have recommended that she proceed with a metabolic evaluation following treatment of her acute stone event.    Cc: Dr. Lucky Cowboy     Signatures Electronically signed by : Heloise Purpura, M.D.; Aug 11 2014 12:10PM EST

## 2014-08-11 NOTE — Discharge Instructions (Addendum)

## 2014-08-11 NOTE — Transfer of Care (Signed)
Immediate Anesthesia Transfer of Care Note  Patient: Shirley Sullivan  Procedure(s) Performed: Procedure(s) with comments: CYSTOSCOPY WITH RETROGRADE PYELOGRAM, URETEROSCOPY AND STENT PLACEMENT,  DIGITAL FLEXIBLE URETEROSCOPE (Left) - request digital flexible ureteroscope  Patient Location: PACU  Anesthesia Type:General  Level of Consciousness: awake, sedated and patient cooperative  Airway & Oxygen Therapy: Patient Spontanous Breathing and Patient connected to face mask oxygen  Post-op Assessment: Report given to PACU RN and Post -op Vital signs reviewed and stable  Post vital signs: Reviewed and stable  Complications: No apparent anesthesia complications

## 2014-08-11 NOTE — Discharge Instructions (Signed)

## 2014-08-11 NOTE — ED Notes (Signed)
Had to reenter IV information,  Taken out by mistake

## 2014-08-11 NOTE — Op Note (Signed)
Preoperative diagnosis: Left ureteral calculus and left renal calculi  Postoperative diagnosis: Left ureteral calculus and left renal calculi  Procedure:  1. Cystoscopy 2. Left ureteroscopy 3. Left ureteral stent placement (6 x 24) 4. Left retrograde pyelography with interpretation  Surgeon: Moody Bruins. M.D.  Anesthesia: General  Complications: None  Intraoperative findings: Left retrograde pyelography demonstrated a filling defect within the proximal left ureter consistent with the patient's known calculus without other abnormalities noted.  EBL: Minimal  Indication: Shirley Sullivan  is a 53 y.o. patient with urolithiasis noted to have a symptomatic left ureteral stone and bilateral renal calculi. After reviewing the management options for treatment, they elected to proceed with the above surgical procedure(s). We have discussed the potential benefits and risks of the procedure, side effects of the proposed treatment, the likelihood of the patient achieving the goals of the procedure, and any potential problems that might occur during the procedure or recuperation. Informed consent has been obtained.  Description of procedure:  The patient was taken to the operating room and general anesthesia was induced.  The patient was placed in the dorsal lithotomy position, prepped and draped in the usual sterile fashion, and preoperative antibiotics were administered. A preoperative time-out was performed.   Cystourethroscopy was performed.  The patient's urethra was examined and was normal. The bladder was then systematically examined in its entirety. There was no evidence for any bladder tumors, stones, or other mucosal pathology.    Attention then turned to the left ureteral orifice and a ureteral catheter was used to intubate the ureteral orifice.  Omnipaque contrast was injected through the ureteral catheter and a retrograde pyelogram was performed with findings as dictated  above.  A 0.38 sensor guidewire was then advanced up the left ureter into the renal pelvis under fluoroscopic guidance.  A 12/14 Fr ureteral access sheath was then advanced over the guide wire to a point below the left proximal ureteral stone. The digital flexible ureteroscope was then advanced through the access sheath into the ureter.  The ureteroscope was not able to be advanced into the proximal ureter and the access sheath could not be advanced even with the inner dilating sheath beyond the mid ureter.  It was therefore determined that she would be best served with ureteral stenting to allow for passive ureteral dilation and a subsequent second procedure in the future after passive ureteral dilation.  The safety wire was then left in place and the access sheath removed.  The guidewire was backloaded through the cystoscope and a ureteral stent was advance over the wire using Seldinger technique.  The stent was positioned appropriately under fluoroscopic and cystoscopic guidance.  The wire was then removed with an adequate stent curl noted in the renal pelvis as well as in the bladder.  The bladder was then emptied and the procedure ended.  The patient appeared to tolerate the procedure well and without complications.  The patient was able to be awakened and transferred to the recovery unit in satisfactory condition.   Moody Bruins MD

## 2014-08-11 NOTE — Anesthesia Preprocedure Evaluation (Signed)
Anesthesia Evaluation  Patient identified by MRN, date of birth, ID band Patient awake    Reviewed: Allergy & Precautions, H&P , NPO status , Patient's Chart, lab work & pertinent test results  Airway Mallampati: II TM Distance: >3 FB Neck ROM: Full    Dental no notable dental hx. (+) Teeth Intact   Pulmonary asthma ,  breath sounds clear to auscultation  Pulmonary exam normal       Cardiovascular negative cardio ROS  Rhythm:Regular Rate:Normal     Neuro/Psych negative neurological ROS  negative psych ROS   GI/Hepatic negative GI ROS, Neg liver ROS,   Endo/Other  Morbid obesity  Renal/GU negative Renal ROS  negative genitourinary   Musculoskeletal lupus   Abdominal   Peds negative pediatric ROS (+)  Hematology negative hematology ROS (+)   Anesthesia Other Findings   Reproductive/Obstetrics negative OB ROS                           Anesthesia Physical Anesthesia Plan  ASA: II  Anesthesia Plan: General   Post-op Pain Management:    Induction: Intravenous  Airway Management Planned: LMA and Oral ETT  Additional Equipment:   Intra-op Plan:   Post-operative Plan: Extubation in OR  Informed Consent: I have reviewed the patients History and Physical, chart, labs and discussed the procedure including the risks, benefits and alternatives for the proposed anesthesia with the patient or authorized representative who has indicated his/her understanding and acceptance.   Dental advisory given  Plan Discussed with: CRNA  Anesthesia Plan Comments:         Anesthesia Quick Evaluation

## 2014-08-12 ENCOUNTER — Encounter (HOSPITAL_COMMUNITY): Payer: Self-pay | Admitting: Urology

## 2014-08-12 ENCOUNTER — Other Ambulatory Visit: Payer: Self-pay | Admitting: Urology

## 2014-08-12 LAB — URINE CULTURE

## 2014-08-12 NOTE — Anesthesia Postprocedure Evaluation (Signed)
  Anesthesia Post-op Note  Patient: Shirley Sullivan  Procedure(s) Performed: Procedure(s) (LRB): CYSTOSCOPY WITH RETROGRADE PYELOGRAM, URETEROSCOPY AND STENT PLACEMENT,  DIGITAL FLEXIBLE URETEROSCOPE (Left)  Patient Location: PACU  Anesthesia Type: General  Level of Consciousness: awake and alert   Airway and Oxygen Therapy: Patient Spontanous Breathing  Post-op Pain: mild  Post-op Assessment: Post-op Vital signs reviewed, Patient's Cardiovascular Status Stable, Respiratory Function Stable, Patent Airway and No signs of Nausea or vomiting  Last Vitals:  Filed Vitals:   08/11/14 1915  BP: 145/86  Pulse: 85  Temp:   Resp: 15    Post-op Vital Signs: stable   Complications: No apparent anesthesia complications

## 2014-08-12 NOTE — ED Provider Notes (Signed)
Medical screening examination/treatment/procedure(s) were performed by non-physician practitioner and as supervising physician I was immediately available for consultation/collaboration.   EKG Interpretation None       Raeford Razor, MD 08/12/14 [phone]

## 2014-08-13 NOTE — ED Provider Notes (Signed)
Medical screening examination/treatment/procedure(s) were performed by non-physician practitioner and as supervising physician I was immediately available for consultation/collaboration.   EKG Interpretation None        Gilda Crease, MD 08/13/14 [phone]

## 2014-08-20 NOTE — Interval H&P Note (Signed)
History and Physical Interval Note:  08/23/14   Shirley Sullivan  has presented today for surgery, with the diagnosis of LEFT URETERAL AND RENAL CALCULI.  I was unable to safely perform ureteroscopy at her last procedure.  She returns today after allowing for passive ureteral dilation after stent placement for definitive stone therapy.  The various methods of treatment have been discussed with the patient and family. After consideration of risks, benefits and other options for treatment, the patient has consented to  Procedure(s): CYSTOSCOPY WITH RETROGRADE PYELOGRAM, URETEROSCOPY AND STENT EXCHANGE (Left) HOLMIUM LASER APPLICATION (Left) as a surgical intervention .  The patient's history has been reviewed, patient examined, no change in status, stable for surgery.  I have reviewed the patient's chart and labs.  Questions were answered to the patient's satisfaction.     Gwendolen Hewlett,LES

## 2014-08-20 NOTE — H&P (Signed)
Chief Complaint Kidney stone   History of Present Illness Shirley Sullivan is a 53 year old female who developed the acute onset of severe left-sided flank pain 2 days ago. This was associated with nausea and vomiting and caused her to present to the emergency department. She was treated for pain and discharged and subsequently presented again and underwent a CT scan which confirmed a 7 mm proximal left ureteral calculus with hydronephrosis. She was also noted to have 2 renal calculi on each side that were nonobstructing. She has denied any fever or hematuria. She states that her pain has remained significant although has been controlled with oral Percocet. She has had a difficult time been able tolerate anything by mouth.     She has 1 prior episode of a kidney stone in 1998 which she passed spontaneously.   Past Medical History Problems  1. History of Anxiety (300.00) 2. History of arthritis (V13.4) 3. History of asthma (V12.69) 4. History of depression (V11.8) 5. History of esophageal reflux (V12.79) 6. History of systemic lupus erythematosus (SLE) (V12.29)  Surgical History Problems  1. History of Appendectomy 2. History of Cesarean Section 3. 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 History of Tonsillectomy  Current Meds 1. ALPRAZolam 0.5 MG Oral Tablet;  Therapy: (Recorded:09Sep2015) to Recorded 2. Cetirizine HCl - 10 MG Oral Tablet;  Therapy: (Recorded:09Sep2015) to Recorded 3. Ondansetron 4 MG Oral Tablet Dispersible;  Therapy: (Recorded:09Sep2015) to Recorded 4. Oxycodone-Acetaminophen 10-325 MG Oral Tablet;  Therapy: (Recorded:09Sep2015) to Recorded 5. Sertraline HCl - 100 MG Oral Tablet;  Therapy: (Recorded:09Sep2015) to Recorded  Allergies Medication  1. Codeine Derivatives 2. Vibramycin CAPS  Family History Problems  1. Family history of cardiac disorder (V17.49) 2. Family history of diabetes mellitus (V18.0) 3. Family history of  No significant past medical history : Mother  Social History Problems    Denied: History of Alcohol use   Married   Never a smoker  Review of Systems Genitourinary, constitutional, skin, eye, otolaryngeal, hematologic/lymphatic, cardiovascular, pulmonary, endocrine, musculoskeletal, gastrointestinal, neurological and psychiatric system(s) were reviewed and pertinent findings if present are noted.  Genitourinary: no hematuria.  Gastrointestinal: nausea and vomiting.    Vitals Vital Signs [Data Includes: Last 1 Day]  Recorded: 09Sep2015 11:19AM  Height: 5 ft 4 in Weight: 216 lb  BMI Calculated: 37.08 BSA Calculated: 2.02 Blood Pressure: 128 / 83 Temperature: 99 F Heart Rate: 112  Physical Exam Constitutional: Well nourished and well developed . No acute distress.  ENT:. The ears and nose are normal in appearance.  Neck: The appearance of the neck is normal and no neck mass is present.  Pulmonary: No respiratory distress, normal respiratory rhythm and effort and clear bilateral breath sounds.  Cardiovascular: Heart rate and rhythm are normal . No peripheral edema.  Abdomen: The abdomen is obese. The abdomen is soft and nontender. No masses are palpated. No CVA tenderness. No hernias are palpable. No hepatosplenomegaly noted.  Lymphatics: The femoral and inguinal nodes are not enlarged or tender.  Skin: Normal skin turgor, no visible rash and no visible skin lesions.  Neuro/Psych:. Mood and affect are appropriate.    Results/Data Urine [Data Includes: Last 1 Day]   09Sep2015  COLOR AMBER   APPEARANCE CLEAR   SPECIFIC GRAVITY 1.020   pH 5.0   GLUCOSE 250 mg/dL  BILIRUBIN SMALL   KETONE 15 mg/dL  BLOOD LARGE   PROTEIN 30 mg/dL  UROBILINOGEN 1 mg/dL  NITRITE NEG   LEUKOCYTE ESTERASE NEG   SQUAMOUS  EPITHELIAL/HPF RARE   WBC 3-6 WBC/hpf  RBC 3-6 RBC/hpf  BACTERIA NONE SEEN   CRYSTALS NONE SEEN   CASTS NONE SEEN    I have individually reviewed her medical records,  labs, and CT scan from 08/10/14. Her serum creatinine 0.71, white blood count 8.7. I independently reviewed her CT scan with findings as stated above.   Assessment Assessed  1. Ureteral stone with hydronephrosis (592.1,591) 2. Renal calculi (592.0)  Plan Health Maintenance  1. UA With REFLEX; [Do Not Release]; Status:Complete;   Done: 09Sep2015 11:07AM  Discussion/Summary 1. 7 mm proximal left ureteral calculus: She still remains significantly symptomatic and would like to proceed with treatment. We reviewed options for therapy including her cutaneous treatment, ureteroscopic laser lithotripsy, and shockwave lithotripsy in the pros and cons of each approach. She does wish to proceed with ureteroscopic laser lithotripsy today and we reviewed the potential risks and complications of this procedure in detail as well as the expected recovery process and need for a postoperative ureteral stent. She gives her informed consent to proceed.    2. Renal calculi: Since we are going to treat her ureteral calculus, we also will plan to treat her left renal calculi today if possible. She does have 2 additional nonobstructing small right renal calculi. I have recommended that she proceed with a metabolic evaluation following treatment of her acute stone event.    Cc: Dr. Lucky Cowboy     Signatures Electronically signed by : Heloise Purpura, M.D.; Aug 11 2014 12:10PM EST

## 2014-08-23 ENCOUNTER — Encounter (HOSPITAL_COMMUNITY)
Admission: RE | Disposition: A | Discharge status: Home / Self Care | Payer: Self-pay | Source: Ambulatory Visit | Attending: Urology

## 2014-08-23 ENCOUNTER — Ambulatory Visit (HOSPITAL_COMMUNITY): Payer: BC Managed Care – PPO | Admitting: Anesthesiology

## 2014-08-23 ENCOUNTER — Ambulatory Visit (HOSPITAL_COMMUNITY)
Admission: RE | Admit: 2014-08-23 | Discharge: 2014-08-23 | Disposition: A | Discharge status: Home / Self Care | Payer: BC Managed Care – PPO | Source: Ambulatory Visit | Attending: Urology | Admitting: Urology

## 2014-08-23 ENCOUNTER — Encounter (HOSPITAL_COMMUNITY): Payer: Self-pay | Admitting: *Deleted

## 2014-08-23 ENCOUNTER — Encounter (HOSPITAL_COMMUNITY): Payer: BC Managed Care – PPO | Admitting: Anesthesiology

## 2014-08-23 DIAGNOSIS — J45909 Unspecified asthma, uncomplicated: Secondary | ICD-10-CM | POA: Insufficient documentation

## 2014-08-23 DIAGNOSIS — F3289 Other specified depressive episodes: Secondary | ICD-10-CM | POA: Diagnosis not present

## 2014-08-23 DIAGNOSIS — F411 Generalized anxiety disorder: Secondary | ICD-10-CM | POA: Diagnosis not present

## 2014-08-23 DIAGNOSIS — N200 Calculus of kidney: Secondary | ICD-10-CM | POA: Diagnosis not present

## 2014-08-23 DIAGNOSIS — Z9089 Acquired absence of other organs: Secondary | ICD-10-CM | POA: Diagnosis not present

## 2014-08-23 DIAGNOSIS — N201 Calculus of ureter: Secondary | ICD-10-CM | POA: Diagnosis present

## 2014-08-23 DIAGNOSIS — Z885 Allergy status to narcotic agent status: Secondary | ICD-10-CM | POA: Insufficient documentation

## 2014-08-23 DIAGNOSIS — K219 Gastro-esophageal reflux disease without esophagitis: Secondary | ICD-10-CM | POA: Insufficient documentation

## 2014-08-23 DIAGNOSIS — Z881 Allergy status to other antibiotic agents status: Secondary | ICD-10-CM | POA: Diagnosis not present

## 2014-08-23 DIAGNOSIS — M329 Systemic lupus erythematosus, unspecified: Secondary | ICD-10-CM | POA: Diagnosis not present

## 2014-08-23 DIAGNOSIS — F329 Major depressive disorder, single episode, unspecified: Secondary | ICD-10-CM | POA: Diagnosis not present

## 2014-08-23 DIAGNOSIS — M129 Arthropathy, unspecified: Secondary | ICD-10-CM | POA: Insufficient documentation

## 2014-08-23 HISTORY — PX: CYSTOSCOPY WITH RETROGRADE PYELOGRAM, URETEROSCOPY AND STENT PLACEMENT: SHX5789

## 2014-08-23 HISTORY — PX: HOLMIUM LASER APPLICATION: SHX5852

## 2014-08-23 LAB — BASIC METABOLIC PANEL
Anion gap: 12 (ref 5–15)
BUN: 11 mg/dL (ref 6–23)
CO2: 26 meq/L (ref 19–32)
Calcium: 9.1 mg/dL (ref 8.4–10.5)
Chloride: 101 mEq/L (ref 96–112)
Creatinine, Ser: 0.57 mg/dL (ref 0.50–1.10)
GFR calc Af Amer: >90 mL/min (ref >90)
GLUCOSE: 156 mg/dL — AB (ref 70–99)
Potassium: 3.7 mEq/L (ref 3.7–5.3)
SODIUM: 139 meq/L (ref 137–147)

## 2014-08-23 LAB — CBC
HEMATOCRIT: 41.5 % (ref 36.0–46.0)
HEMOGLOBIN: 14.3 g/dL (ref 12.0–15.0)
MCH: 28.5 pg (ref 26.0–34.0)
MCHC: 34.5 g/dL (ref 30.0–36.0)
MCV: 82.7 fL (ref 78.0–100.0)
Platelets: 172 10*3/uL (ref 150–400)
RBC: 5.02 MIL/uL (ref 3.87–5.11)
RDW: 13.3 % (ref 11.5–15.5)
WBC: 5.2 10*3/uL (ref 4.0–10.5)

## 2014-08-23 SURGERY — CYSTOURETEROSCOPY, WITH RETROGRADE PYELOGRAM AND STENT INSERTION
Anesthesia: General | Laterality: Left

## 2014-08-23 MED ORDER — MIDAZOLAM HCL 2 MG/2ML IJ SOLN
INTRAMUSCULAR | Status: AC
  Filled 2014-08-23: qty 2

## 2014-08-23 MED ORDER — FENTANYL CITRATE 0.05 MG/ML IJ SOLN
INTRAMUSCULAR | Status: AC
  Filled 2014-08-23: qty 2

## 2014-08-23 MED ORDER — MEPERIDINE HCL 50 MG/ML IJ SOLN: 6.2500 mg | INTRAMUSCULAR | Status: DC | PRN

## 2014-08-23 MED ORDER — ONDANSETRON HCL 4 MG PO TABS: 4.0000 mg | ORAL_TABLET | Freq: Three times a day (TID) | ORAL | Status: DC | PRN

## 2014-08-23 MED ORDER — CIPROFLOXACIN HCL 250 MG PO TABS: 250.0000 mg | ORAL_TABLET | Freq: Two times a day (BID) | ORAL | Status: DC

## 2014-08-23 MED ORDER — LACTATED RINGERS IV SOLN: INTRAVENOUS | Status: DC

## 2014-08-23 MED ORDER — MIDAZOLAM HCL 5 MG/5ML IJ SOLN
INTRAMUSCULAR | Status: DC | PRN
  Administered 2014-08-23: 2 mg via INTRAVENOUS

## 2014-08-23 MED ORDER — CIPROFLOXACIN IN D5W 400 MG/200ML IV SOLN
INTRAVENOUS | Status: AC
  Filled 2014-08-23: qty 200

## 2014-08-23 MED ORDER — OXYCODONE-ACETAMINOPHEN 5-325 MG PO TABS: 1.0000 | ORAL_TABLET | ORAL | Status: DC | PRN

## 2014-08-23 MED ORDER — METOCLOPRAMIDE HCL 5 MG/ML IJ SOLN: 10.0000 mg | Freq: Once | INTRAMUSCULAR | Status: DC | PRN

## 2014-08-23 MED ORDER — PROPOFOL 10 MG/ML IV BOLUS
INTRAVENOUS | Status: DC | PRN
  Administered 2014-08-23: 150 mg via INTRAVENOUS

## 2014-08-23 MED ORDER — FENTANYL CITRATE 0.05 MG/ML IJ SOLN
INTRAMUSCULAR | Status: DC | PRN
  Administered 2014-08-23 (×2): 50 ug via INTRAVENOUS

## 2014-08-23 MED ORDER — PROPOFOL 10 MG/ML IV BOLUS
INTRAVENOUS | Status: AC
  Filled 2014-08-23: qty 20

## 2014-08-23 MED ORDER — LACTATED RINGERS IV SOLN
INTRAVENOUS | Status: DC
  Administered 2014-08-23: 10:00:00 via INTRAVENOUS

## 2014-08-23 MED ORDER — 0.9 % SODIUM CHLORIDE (POUR BTL) OPTIME
TOPICAL | Status: DC | PRN
  Administered 2014-08-23: 1000 mL

## 2014-08-23 MED ORDER — FENTANYL CITRATE 0.05 MG/ML IJ SOLN: 25.0000 ug | INTRAMUSCULAR | Status: DC | PRN

## 2014-08-23 MED ORDER — CIPROFLOXACIN IN D5W 400 MG/200ML IV SOLN
400.0000 mg | INTRAVENOUS | Status: AC
  Administered 2014-08-23: 400 mg via INTRAVENOUS

## 2014-08-23 MED ORDER — FLUCONAZOLE 150 MG PO TABS: 150.0000 mg | ORAL_TABLET | Freq: Every day | ORAL | Status: DC

## 2014-08-23 SURGICAL SUPPLY — 21 items
BAG URO CATCHER STRL LF (DRAPE) ×3 IMPLANT
BASKET ZERO TIP NITINOL 2.4FR (BASKET) ×2 IMPLANT
BSKT STON RTRVL ZERO TP 2.4FR (BASKET) ×1
CATH INTERMIT  6FR 70CM (CATHETERS) IMPLANT
CLOTH BEACON ORANGE TIMEOUT ST (SAFETY) ×3 IMPLANT
DRAPE CAMERA CLOSED 9X96 (DRAPES) ×3 IMPLANT
FIBER LASER FLEXIVA 1000 (UROLOGICAL SUPPLIES) ×1 IMPLANT
FIBER LASER FLEXIVA 200 (UROLOGICAL SUPPLIES) ×1 IMPLANT
FIBER LASER FLEXIVA 365 (UROLOGICAL SUPPLIES) ×1 IMPLANT
FIBER LASER FLEXIVA 550 (UROLOGICAL SUPPLIES) ×1 IMPLANT
FIBER LASER TRAC TIP (UROLOGICAL SUPPLIES) ×3 IMPLANT
GLOVE BIOGEL M STRL SZ7.5 (GLOVE) ×3 IMPLANT
GOWN STRL REUS W/TWL LRG LVL3 (GOWN DISPOSABLE) ×6 IMPLANT
GUIDEWIRE ANG ZIPWIRE 038X150 (WIRE) IMPLANT
GUIDEWIRE STR DUAL SENSOR (WIRE) ×3 IMPLANT
MANIFOLD NEPTUNE II (INSTRUMENTS) ×3 IMPLANT
PACK CYSTO (CUSTOM PROCEDURE TRAY) ×3 IMPLANT
SHEATH ACCESS URETERAL 38CM (SHEATH) ×2 IMPLANT
STENT CONTOUR 6FRX24X.038 (STENTS) ×2 IMPLANT
TUBING CONNECTING 10 (TUBING) ×2 IMPLANT
TUBING CONNECTING 10' (TUBING) ×1

## 2014-08-23 NOTE — Transfer of Care (Signed)
Immediate Anesthesia Transfer of Care Note  Patient: Shirley Sullivan  Procedure(s) Performed: Procedure(s): CYSTOSCOPY WITH RETROGRADE PYELOGRAM, URETEROSCOPY AND STENT EXCHANGE (Left) HOLMIUM LASER APPLICATION (Left)  Patient Location: PACU  Anesthesia Type:General  Level of Consciousness: awake, sedated and patient cooperative  Airway & Oxygen Therapy: Patient Spontanous Breathing and Patient connected to face mask oxygen  Post-op Assessment: Report given to PACU RN and Post -op Vital signs reviewed and stable  Post vital signs: Reviewed and stable  Complications: No apparent anesthesia complications

## 2014-08-23 NOTE — Discharge Instructions (Addendum)
1. You may see some blood in the urine and may have some burning with urination for 48-72 hours. You also may notice that you have to urinate more frequently or urgently after your procedure which is normal.  2. You should call should you develop an inability urinate, fever > 101, persistent nausea and vomiting that prevents you from eating or drinking to stay hydrated.  3. If you have a stent, you will likely urinate more frequently and urgently until the stent is removed and you may experience some discomfort/pain in the lower abdomen and flank especially when urinating. You may take pain medication prescribed to you if needed for pain. You may also intermittently have blood in the urine until the stent is removed. 4.You may remove your stent on Friday morning.  Simply pull the string extending from you urethra while in the shower as a small amount of urine will likely also come out.  If you experience some pain after stent removal, you should take your pain medication, and the pain usually will resolve with time.     General Anesthesia, Care After Refer to this sheet in the next few weeks. These instructions provide you with information on caring for yourself after your procedure. Your health care provider may also give you more specific instructions. Your treatment has been planned according to current medical practices, but problems sometimes occur. Call your health care provider if you have any problems or questions after your procedure. WHAT TO EXPECT AFTER THE PROCEDURE After the procedure, it is typical to experience: Sleepiness. Nausea and vomiting. HOME CARE INSTRUCTIONS For the first 24 hours after general anesthesia: Have a responsible person with you. Do not drive a car. If you are alone, do not take public transportation. Do not drink alcohol. Do not take medicine that has not been prescribed by your health care provider. Do not sign important papers or make important  decisions. You may resume a normal diet and activities as directed by your health care provider. Change bandages (dressings) as directed. If you have questions or problems that seem related to general anesthesia, call the hospital and ask for the anesthetist or anesthesiologist on call. SEEK MEDICAL CARE IF: You have nausea and vomiting that continue the day after anesthesia. You develop a rash. SEEK IMMEDIATE MEDICAL CARE IF:  You have difficulty breathing. You have chest pain. You have any allergic problems. Document Released: 02/25/2001 Document Revised: 11/24/2013 Document Reviewed: 06/04/2013 [facility] Patient Information 2015 [place], [place]. This information is not intended to replace advice given to you by your health care provider. Make sure you discuss any questions you have with your health care provider.

## 2014-08-23 NOTE — Op Note (Signed)
Preoperative diagnosis: Left ureteral and renal calculi  Postoperative diagnosis: Left ureteral and renal calculi  Procedure:  1. Cystoscopy 2. Left ureteroscopy and stone removal 3. Ureteroscopic laser lithotripsy 4. Left ureteral stent placement (6 x 24 - string)  Surgeon: Rolly Salter, Montez Hageman. M.D.  Anesthesia: General  Complications: None  EBL: Minimal  Specimens: 1. Left ureteral and renal calculi  Disposition of specimens: [facility] for stone analysis  Indication: Shirley Sullivan is a 53 y.o. year old patient with urolithiasis. She underwent attempted left ureteroscopic laser lithotripsy recently that was unsuccessful due to the small caliber of her ureter which prevented the ability to access her proximal ureter with the ureteroscope.  She underwent left ureteral stent placement for passive dilation and returns today for definitive stone management. After reviewing the management options for treatment, the patient elected to proceed with the above surgical procedure(s). We have discussed the potential benefits and risks of the procedure, side effects of the proposed treatment, the likelihood of the patient achieving the goals of the procedure, and any potential problems that might occur during the procedure or recuperation. Informed consent has been obtained.  Description of procedure:  The patient was taken to the operating room and general anesthesia was induced.  The patient was placed in the dorsal lithotomy position, prepped and draped in the usual sterile fashion, and preoperative antibiotics were administered. A preoperative time-out was performed.   Cystourethroscopy was performed.  The patient's urethra was examined and was normal. The bladder was then systematically examined in its entirety. There was no evidence for any bladder tumors, stones, or other mucosal pathology.    Attention then turned to the left ureteral orifice and the ureteral stent was  identified.  It was brought out of the urethra with flexible graspers.  A 0.38 sensor guidewire was then advanced up the left ureter through the stent into the renal pelvis under fluoroscopic guidance. The 6 Fr semirigid ureteroscope was then advanced into the ureter next to the guidewire and the calculus was identified in the proximal ureter.   The stone was then fragmented with the 365 micron holmium laser fiber on a setting of 0.6 J and frequency of 6 Hz.   All stones were then removed from the ureter with a zero tip nitinol basket.  Reinspection of the ureter revealed no remaining visible stones or fragments.  I then placed a 12/14 ureteral access sheath into the proximal ureter under fluoroscopic guidance and the digital flexible ureteroscope was used to inspect the proximal ureter and the renal collecting system.  Three sizeable fragments were noted and were removed with the 0 tip nitinol basket.  The renal collecting system was re-examined and no additional fragments were noted. The ureteral access sheath was then removed and the wire was left in the renal pelvis.  The wire was then backloaded through the cystoscope and a ureteral stent was advance over the wire using Seldinger technique.  The stent was positioned appropriately under fluoroscopic and cystoscopic guidance.  The wire was then removed with an adequate stent curl noted in the renal pelvis as well as in the bladder.  The bladder was then emptied and the procedure ended.  The patient appeared to tolerate the procedure well and without complications.  The patient was able to be awakened and transferred to the recovery unit in satisfactory condition.

## 2014-08-23 NOTE — Anesthesia Postprocedure Evaluation (Signed)
  Anesthesia Post-op Note  Patient: Shirley Sullivan  Procedure(s) Performed: Procedure(s) (LRB): CYSTOSCOPY WITH RETROGRADE PYELOGRAM, URETEROSCOPY AND STENT EXCHANGE (Left) HOLMIUM LASER APPLICATION (Left)  Patient Location: PACU  Anesthesia Type: General  Level of Consciousness: awake and alert   Airway and Oxygen Therapy: Patient Spontanous Breathing  Post-op Pain: mild  Post-op Assessment: Post-op Vital signs reviewed, Patient's Cardiovascular Status Stable, Respiratory Function Stable, Patent Airway and No signs of Nausea or vomiting  Last Vitals:  Filed Vitals:   08/23/14 1322  BP: 150/85  Pulse: 75  Temp:   Resp: 16    Post-op Vital Signs: stable   Complications: No apparent anesthesia complications

## 2014-08-23 NOTE — Progress Notes (Signed)
Paged Dr Laverle Patter. He states patient is to start cipro on Wednesday . She is to remove stent on Friday.

## 2014-08-23 NOTE — Anesthesia Preprocedure Evaluation (Signed)
Anesthesia Evaluation  Patient identified by MRN, date of birth, ID band Patient awake    Reviewed: Allergy & Precautions, H&P , NPO status , Patient's Chart, lab work & pertinent test results  Airway Mallampati: II TM Distance: >3 FB Neck ROM: Full    Dental no notable dental hx. (+) Teeth Intact   Pulmonary asthma ,  breath sounds clear to auscultation  Pulmonary exam normal       Cardiovascular negative cardio ROS  Rhythm:Regular Rate:Normal     Neuro/Psych negative neurological ROS  negative psych ROS   GI/Hepatic negative GI ROS, Neg liver ROS,   Endo/Other  Morbid obesity  Renal/GU negative Renal ROS  negative genitourinary   Musculoskeletal lupus   Abdominal   Peds negative pediatric ROS (+)  Hematology negative hematology ROS (+)   Anesthesia Other Findings   Reproductive/Obstetrics negative OB ROS                           Anesthesia Physical  Anesthesia Plan  ASA: II  Anesthesia Plan: General   Post-op Pain Management:    Induction: Intravenous  Airway Management Planned: LMA  Additional Equipment:   Intra-op Plan:   Post-operative Plan: Extubation in OR  Informed Consent: I have reviewed the patients History and Physical, chart, labs and discussed the procedure including the risks, benefits and alternatives for the proposed anesthesia with the patient or authorized representative who has indicated his/her understanding and acceptance.   Dental advisory given  Plan Discussed with: CRNA  Anesthesia Plan Comments:         Anesthesia Quick Evaluation

## 2014-08-23 NOTE — Interval H&P Note (Signed)
History and Physical Interval Note:  08/23/2014 10:11 AM  Shirley Sullivan  has presented today for surgery, with the diagnosis of LEFT URETERAL AND RENAL CALCULI. She underwent recent stent placement and could not undergo ureteroscopy due to the small caliber of her ureter.  The various methods of treatment have been discussed with the patient and family. After consideration of risks, benefits and other options for treatment, the patient has consented to  Procedure(s): CYSTOSCOPY WITH RETROGRADE PYELOGRAM, URETEROSCOPY AND STENT EXCHANGE (Left) HOLMIUM LASER APPLICATION (Left) as a surgical intervention .  The patient's history has been reviewed, patient examined, no change in status, stable for surgery.  I have reviewed the patient's chart and labs.  Questions were answered to the patient's satisfaction.     Sireen Halk,LES

## 2014-08-24 ENCOUNTER — Encounter (HOSPITAL_COMMUNITY): Payer: Self-pay | Admitting: Urology

## 2015-01-20 ENCOUNTER — Ambulatory Visit (INDEPENDENT_AMBULATORY_CARE_PROVIDER_SITE_OTHER): Payer: BLUE CROSS/BLUE SHIELD | Admitting: Sports Medicine

## 2015-01-20 VITALS — BP 131/83 | HR 116 | Temp 99.8°F | Resp 20 | Ht 64.0 in | Wt 231.4 lb

## 2015-01-20 DIAGNOSIS — R52 Pain, unspecified: Secondary | ICD-10-CM

## 2015-01-20 DIAGNOSIS — B9789 Other viral agents as the cause of diseases classified elsewhere: Principal | ICD-10-CM

## 2015-01-20 DIAGNOSIS — J069 Acute upper respiratory infection, unspecified: Secondary | ICD-10-CM

## 2015-01-20 LAB — POCT INFLUENZA A/B
INFLUENZA A, POC: NEGATIVE
Influenza B, POC: NEGATIVE

## 2015-01-20 MED ORDER — ALBUTEROL SULFATE HFA 108 (90 BASE) MCG/ACT IN AERS: 1.0000 | INHALATION_SPRAY | RESPIRATORY_TRACT | Status: DC | PRN

## 2015-01-20 MED ORDER — BENZONATATE 100 MG PO CAPS: 100.0000 mg | ORAL_CAPSULE | Freq: Two times a day (BID) | ORAL | Status: DC | PRN

## 2015-01-20 NOTE — Patient Instructions (Signed)
You have viral infection that will resolve on its own over time.  Typically, symptoms can last for up to 10 days (or more) but should be continually improving after the 5th day.  If you exerpience worsening after the 7th day please call us back.  Please continue to drink plenty of fluids and remember you and everybody in your household need to wash their hands frequently!  Take acetaminophen (Tylenol) 1X500mg  or 2X325mg  tablets every 8 hours  Take Ibuprofen (Advil or Motrin) 2-3  tablets every 8 hours  Try to alternate the acetaminophen and ibuprofen so that you take one every 4 hours Honey has been shown to help with cough.  You can try mixing  a teaspoon of honey in warm water or caffeine free herbal tea before bedtime. We don't know why, but chicken soup also helps, try it!   Upper Respiratory Infection, Adult An upper respiratory infection (URI) is also sometimes known as the common cold. The upper respiratory tract includes the nose, sinuses, throat, trachea, and bronchi. Bronchi are the airways leading to the lungs. Most people improve within 1 week, but symptoms can last up to 2 weeks. A residual cough may last even longer.  CAUSES Many different viruses can infect the tissues lining the upper respiratory tract. The tissues become irritated and inflamed and often become very moist. Mucus production is also common. A cold is contagious. You can easily spread the virus to others by oral contact. This includes kissing, sharing a glass, coughing, or sneezing. Touching your mouth or nose and then touching a surface, which is then touched by another person, can also spread the virus. SYMPTOMS  Symptoms typically develop 1 to 3 days after you come in contact with a cold virus. Symptoms vary from person to person. They may include:  Runny nose.  Sneezing.  Nasal congestion.  Sinus irritation.  Sore throat.  Loss of voice (laryngitis).  Cough.  Fatigue.  Muscle aches.  Loss of  appetite.  Headache.  Low-grade fever. DIAGNOSIS  You might diagnose your own cold based on familiar symptoms, since most people get a cold 2 to 3 times a year. Your caregiver can confirm this based on your exam. Most importantly, your caregiver can check that your symptoms are not due to another disease such as strep throat, sinusitis, pneumonia, asthma, or epiglottitis. Blood tests, throat tests, and X-rays are not necessary to diagnose a common cold, but they may sometimes be helpful in excluding other more serious diseases. Your caregiver will decide if any further tests are required. RISKS AND COMPLICATIONS  You may be at risk for a more severe case of the common cold if you smoke cigarettes, have chronic heart disease (such as heart failure) or lung disease (such as asthma), or if you have a weakened immune system. The very young and very old are also at risk for more serious infections. Bacterial sinusitis, middle ear infections, and bacterial pneumonia can complicate the common cold. The common cold can worsen asthma and chronic obstructive pulmonary disease (COPD). Sometimes, these complications can require emergency medical care and may be life-threatening. PREVENTION  The best way to protect against getting a cold is to practice good hygiene. Avoid oral or hand contact with people with cold symptoms. Wash your hands often if contact occurs. There is no clear evidence that vitamin C, vitamin E, echinacea, or exercise reduces the chance of developing a cold. However, it is always recommended to get plenty of rest and practice good nutrition.  TREATMENT  Treatment is directed at relieving symptoms. There is no cure. Antibiotics are not effective, because the infection is caused by a virus, not by bacteria. Treatment may include:  Increased fluid intake. Sports drinks offer valuable electrolytes, sugars, and fluids.  Breathing heated mist or steam (vaporizer or shower).  Eating chicken soup  or other clear broths, and maintaining good nutrition.  Getting plenty of rest.  Using gargles or lozenges for comfort.  Controlling fevers with ibuprofen or acetaminophen as directed by your caregiver.  Increasing usage of your inhaler if you have asthma. Zinc gel and zinc lozenges, taken in the first 24 hours of the common cold, can shorten the duration and lessen the severity of symptoms. Pain medicines may help with fever, muscle aches, and throat pain. A variety of non-prescription medicines are available to treat congestion and runny nose. Your caregiver can make recommendations and may suggest nasal or lung inhalers for other symptoms.  HOME CARE INSTRUCTIONS   Only take over-the-counter or prescription medicines for pain, discomfort, or fever as directed by your caregiver.  Use a warm mist humidifier or inhale steam from a shower to increase air moisture. This may keep secretions moist and make it easier to breathe.  Drink enough water and fluids to keep your urine clear or pale yellow.  Rest as needed.  Return to work when your temperature has returned to normal or as your caregiver advises. You may need to stay home longer to avoid infecting others. You can also use a face mask and careful hand washing to prevent spread of the virus. SEEK MEDICAL CARE IF:   After the first few days, you feel you are getting worse rather than better.  You need your caregiver's advice about medicines to control symptoms.  You develop chills, worsening shortness of breath, or brown or red sputum. These may be signs of pneumonia.  You develop yellow or brown nasal discharge or pain in the face, especially when you bend forward. These may be signs of sinusitis.  You develop a fever, swollen neck glands, pain with swallowing, or white areas in the back of your throat. These may be signs of strep throat. SEEK IMMEDIATE MEDICAL CARE IF:   You have a fever.  You develop severe or persistent  headache, ear pain, sinus pain, or chest pain.  You develop wheezing, a prolonged cough, cough up blood, or have a change in your usual mucus (if you have chronic lung disease).  You develop sore muscles or a stiff neck. Document Released: 05/15/2001 Document Revised: 02/11/2012 Document Reviewed: 02/24/2014 [facility] Patient Information 2015 [place], [place]. This information is not intended to replace advice given to you by your health care provider. Make sure you discuss any questions you have with your health care provider.

## 2015-01-20 NOTE — Progress Notes (Signed)
  Shirley Sullivan - 54 y.o. female MRN [id number]  Date of birth: 10/12/1961  SUBJECTIVE: CC:  Chief Complaint  Patient presents with  . Cough    x 2 days  . Wheezing    mostly at night    HPI: Patient reports 2 days of worsening URI like symptoms.  Associated generalized body aches.  Denies any rigors or chills but did note a MAXIMUM TEMPERATURE at home of 101F.  Slight anorexia but tolerating liquids without difficulty.  Taking occasional ibuprofen with some overall improvement.  Reports audible wheezing but no overt difficulty breathing. Associated with nonproductive cough.  Denies any nausea, vomiting, abdominal pain, diarrhea, hematochezia or melena.  Generalized facial pressure and stuffiness. No changes in hearing or ear fullness.  ROS: per HPI  HISTORY:  Past Medical, Surgical, Social, and Family History reviewed & updated per EMR.  Pertinent Historical Findings include:  reports that she has never smoked. She has never used smokeless tobacco.   Past Medical History  Diagnosis Date  . Lupus   . Anxiety   . Depression   . History of kidney stones 1998, 2015  . Asthma     perfumes triggers attacks. Has inhalers for rescue  . Allergy   . Arthritis    Multiple Surgeries reviewed  OBJECTIVE:  VS:   HT:5\' 4"  (162.6 cm)   WT:231 lb 6 oz (104.951 kg)  BMI:39.8          BP:131/83 mmHg  HR:(!) 116bpm  TEMP:99.8 F (37.7 C)(Oral)  RESP:97 %  PHYSICAL EXAM:  Physical Exam  Constitutional: She is oriented to person, place, and time and well-developed, well-nourished, and in no distress. No distress.  Nontoxic appearing  HENT:  Head: Normocephalic and atraumatic.  Eyes: Right eye exhibits no discharge. Left eye exhibits no discharge. No scleral icterus.  Neck: No JVD present.  Cardiovascular: Normal rate, regular rhythm and normal heart sounds.  Exam reveals no gallop and no friction rub.   No murmur heard. Pulmonary/Chest: Effort normal. No respiratory  distress. She has wheezes (faint diffuse, end expiratory, overall good air movement). She has no rales. She exhibits no tenderness.  Abdominal: She exhibits no distension. There is no tenderness.  Neurological: She is alert and oriented to person, place, and time.  Skin: Skin is warm and dry. No rash noted. She is not diaphoretic. No erythema. No pallor.  Psychiatric: Mood, memory, affect and judgment normal.  Vitals reviewed.  DATA OBTAINED DURING VISIT: Results for orders placed or performed in visit on 01/20/15  POCT Influenza A/B  Result Value Ref Range   Influenza A, POC Negative    Influenza B, POC Negative     ASSESSMENT: 1. Viral URI with cough   2. Body aches    PLAN: See problem based charting & AVS for additional documentation.  Symptomatic treatment with scheduled NSAIDs, Tessalon   Discussed red flags that would warrant reevaluation for worsening symptoms. Discussed appropriate antibiotic nonuse  Refill albuterol  Red flags discussed > No Follow-up on file.

## 2015-01-21 NOTE — Progress Notes (Signed)
History and physical examinations reviewed in detail; agree with assessment and plan as outlined by Dr. Berline Choughigby.

## 2015-01-24 ENCOUNTER — Emergency Department (HOSPITAL_COMMUNITY): Payer: BLUE CROSS/BLUE SHIELD

## 2015-01-24 ENCOUNTER — Encounter (HOSPITAL_COMMUNITY): Payer: Self-pay

## 2015-01-24 ENCOUNTER — Emergency Department (HOSPITAL_COMMUNITY)
Admission: EM | Admit: 2015-01-24 | Discharge: 2015-01-24 | Disposition: A | Discharge status: Home / Self Care | Payer: BLUE CROSS/BLUE SHIELD | Attending: Emergency Medicine | Admitting: Emergency Medicine

## 2015-01-24 DIAGNOSIS — Z791 Long term (current) use of non-steroidal anti-inflammatories (NSAID): Secondary | ICD-10-CM | POA: Diagnosis not present

## 2015-01-24 DIAGNOSIS — J45909 Unspecified asthma, uncomplicated: Secondary | ICD-10-CM | POA: Insufficient documentation

## 2015-01-24 DIAGNOSIS — M1990 Unspecified osteoarthritis, unspecified site: Secondary | ICD-10-CM | POA: Diagnosis not present

## 2015-01-24 DIAGNOSIS — Z9049 Acquired absence of other specified parts of digestive tract: Secondary | ICD-10-CM | POA: Insufficient documentation

## 2015-01-24 DIAGNOSIS — F419 Anxiety disorder, unspecified: Secondary | ICD-10-CM | POA: Diagnosis not present

## 2015-01-24 DIAGNOSIS — E669 Obesity, unspecified: Secondary | ICD-10-CM | POA: Diagnosis not present

## 2015-01-24 DIAGNOSIS — Z87442 Personal history of urinary calculi: Secondary | ICD-10-CM | POA: Diagnosis not present

## 2015-01-24 DIAGNOSIS — K8020 Calculus of gallbladder without cholecystitis without obstruction: Secondary | ICD-10-CM | POA: Diagnosis not present

## 2015-01-24 DIAGNOSIS — F329 Major depressive disorder, single episode, unspecified: Secondary | ICD-10-CM | POA: Insufficient documentation

## 2015-01-24 DIAGNOSIS — Z90710 Acquired absence of both cervix and uterus: Secondary | ICD-10-CM | POA: Insufficient documentation

## 2015-01-24 DIAGNOSIS — Z79899 Other long term (current) drug therapy: Secondary | ICD-10-CM | POA: Diagnosis not present

## 2015-01-24 DIAGNOSIS — R079 Chest pain, unspecified: Secondary | ICD-10-CM | POA: Diagnosis present

## 2015-01-24 DIAGNOSIS — R1013 Epigastric pain: Secondary | ICD-10-CM

## 2015-01-24 DIAGNOSIS — K219 Gastro-esophageal reflux disease without esophagitis: Secondary | ICD-10-CM | POA: Diagnosis not present

## 2015-01-24 LAB — BASIC METABOLIC PANEL
Anion gap: 11 (ref 5–15)
BUN: 10 mg/dL (ref 6–23)
CO2: 27 mmol/L (ref 19–32)
Calcium: 8.7 mg/dL (ref 8.4–10.5)
Chloride: 97 mmol/L (ref 96–112)
Creatinine, Ser: 0.46 mg/dL — ABNORMAL LOW (ref 0.50–1.10)
GFR calc Af Amer: >90 mL/min (ref >90)
GFR calc non Af Amer: >90 mL/min (ref >90)
Glucose, Bld: 258 mg/dL — ABNORMAL HIGH (ref 70–99)
Potassium: 2.9 mmol/L — ABNORMAL LOW (ref 3.5–5.1)
Sodium: 135 mmol/L (ref 135–145)

## 2015-01-24 LAB — I-STAT TROPONIN, ED
TROPONIN I, POC: 0.01 ng/mL (ref 0.00–0.08)
Troponin i, poc: 0 ng/mL (ref 0.00–0.08)
Troponin i, poc: 0 ng/mL (ref 0.00–0.08)

## 2015-01-24 LAB — CBC
HCT: 42.9 % (ref 36.0–46.0)
HEMOGLOBIN: 15.1 g/dL — AB (ref 12.0–15.0)
MCH: 28.9 pg (ref 26.0–34.0)
MCHC: 35.2 g/dL (ref 30.0–36.0)
MCV: 82 fL (ref 78.0–100.0)
PLATELETS: 109 10*3/uL — AB (ref 150–400)
RBC: 5.23 MIL/uL — ABNORMAL HIGH (ref 3.87–5.11)
RDW: 12.8 % (ref 11.5–15.5)
WBC: 4.6 10*3/uL (ref 4.0–10.5)

## 2015-01-24 LAB — URINE MICROSCOPIC-ADD ON

## 2015-01-24 LAB — HEPATIC FUNCTION PANEL
ALBUMIN: 3.9 g/dL (ref 3.5–5.2)
ALK PHOS: 68 U/L (ref 39–117)
ALT: 37 U/L — ABNORMAL HIGH (ref 0–35)
AST: 37 U/L (ref 0–37)
Bilirubin, Direct: 0.2 mg/dL (ref 0.0–0.5)
Indirect Bilirubin: 0.6 mg/dL (ref 0.3–0.9)
Total Bilirubin: 0.8 mg/dL (ref 0.3–1.2)
Total Protein: 7.6 g/dL (ref 6.0–8.3)

## 2015-01-24 LAB — D-DIMER, QUANTITATIVE (NOT AT ARMC): D DIMER QUANT: 0.28 ug{FEU}/mL (ref 0.00–0.48)

## 2015-01-24 LAB — LIPASE, BLOOD: Lipase: 17 U/L (ref 11–59)

## 2015-01-24 LAB — URINALYSIS, ROUTINE W REFLEX MICROSCOPIC
Bilirubin Urine: NEGATIVE
Glucose, UA: >1000 mg/dL — AB
KETONES UR: NEGATIVE mg/dL
Leukocytes, UA: NEGATIVE
NITRITE: NEGATIVE
Protein, ur: 30 mg/dL — AB
Specific Gravity, Urine: 1.035 — ABNORMAL HIGH (ref 1.005–1.030)
Urobilinogen, UA: 1 mg/dL (ref 0.0–1.0)
pH: 6 (ref 5.0–8.0)

## 2015-01-24 MED ORDER — SODIUM CHLORIDE 0.9 % IV SOLN
1000.0000 mL | Freq: Once | INTRAVENOUS | Status: AC
  Administered 2015-01-24: 1000 mL via INTRAVENOUS

## 2015-01-24 MED ORDER — MORPHINE SULFATE 4 MG/ML IJ SOLN
4.0000 mg | Freq: Once | INTRAMUSCULAR | Status: AC
  Administered 2015-01-24: 4 mg via INTRAVENOUS
  Filled 2015-01-24: qty 1

## 2015-01-24 MED ORDER — PANTOPRAZOLE SODIUM 40 MG IV SOLR
40.0000 mg | Freq: Once | INTRAVENOUS | Status: AC
  Administered 2015-01-24: 40 mg via INTRAVENOUS
  Filled 2015-01-24: qty 40

## 2015-01-24 MED ORDER — GI COCKTAIL ~~LOC~~
30.0000 mL | Freq: Once | ORAL | Status: AC
  Administered 2015-01-24: 30 mL via ORAL
  Filled 2015-01-24: qty 30

## 2015-01-24 MED ORDER — ONDANSETRON HCL 4 MG/2ML IJ SOLN
4.0000 mg | Freq: Once | INTRAMUSCULAR | Status: AC
  Administered 2015-01-24: 4 mg via INTRAVENOUS
  Filled 2015-01-24: qty 2

## 2015-01-24 MED ORDER — PANTOPRAZOLE SODIUM 20 MG PO TBEC: 20.0000 mg | DELAYED_RELEASE_TABLET | Freq: Every day | ORAL | Status: DC

## 2015-01-24 MED ORDER — IOHEXOL 300 MG/ML  SOLN
50.0000 mL | Freq: Once | INTRAMUSCULAR | Status: AC | PRN
  Administered 2015-01-24: 50 mL via ORAL

## 2015-01-24 MED ORDER — SODIUM CHLORIDE 0.9 % IV SOLN
1000.0000 mL | INTRAVENOUS | Status: DC
  Administered 2015-01-24: 1000 mL via INTRAVENOUS

## 2015-01-24 MED ORDER — HYDROCODONE-ACETAMINOPHEN 5-325 MG PO TABS: 1.0000 | ORAL_TABLET | ORAL | Status: DC | PRN

## 2015-01-24 MED ORDER — IOHEXOL 300 MG/ML  SOLN
100.0000 mL | Freq: Once | INTRAMUSCULAR | Status: AC | PRN
  Administered 2015-01-24: 100 mL via INTRAVENOUS

## 2015-01-24 MED ORDER — POTASSIUM CHLORIDE CRYS ER 20 MEQ PO TBCR
40.0000 meq | EXTENDED_RELEASE_TABLET | Freq: Once | ORAL | Status: AC
  Administered 2015-01-24: 40 meq via ORAL
  Filled 2015-01-24: qty 2

## 2015-01-24 NOTE — ED Provider Notes (Signed)
CSN: [id number]     Arrival date & time 01/24/15  0701 History   First MD Initiated Contact with Patient 01/24/15 [phone]     Chief Complaint  Patient presents with  . Chest Pain   HPI Patient presents to the emergency room with complaints of chest pressure and abdominal pain. Patient has had symptoms for the last week of a cough and congestion. She saw her primary care doctor at an urgent care and was diagnosed with a viral infection. Her symptoms have persisted. He continues to have a cough. She has had poor appetite but no issues with abdominal pain. She has not had any fevers. She occasionally has had some posttussive emesis. This morning however at about 4 AM she woke up with pain in her chest. It is a severe pressure sensation. Associated with this she has had nausea and vomiting. She also feels the discomfort down towards her umbilicus and somewhat back. She denies any trouble with diarrhea. She does not feel pain in her abdomen. She does not feel short of breath. She denies any issues with leg swelling. No prior history of heart disease or pulmonary embolism or DVT.  She has been taking nsaids since her bronchitis started but no medications specifically for the pain this am. Past Medical History  Diagnosis Date  . Lupus   . Anxiety   . Depression   . History of kidney stones 1998, 2015  . Asthma     perfumes triggers attacks. Has inhalers for rescue  . Allergy   . Arthritis    Past Surgical History  Procedure Laterality Date  . Abdominal hysterectomy  2004  . Appendectomy  1992  . Rotator cuff repair Right 2007  . Cystoscopy with retrograde pyelogram, ureteroscopy and stent placement Left 08/11/2014    Procedure: CYSTOSCOPY WITH RETROGRADE PYELOGRAM, URETEROSCOPY AND STENT PLACEMENT,  DIGITAL FLEXIBLE URETEROSCOPE;  Surgeon: Heloise Purpura, MD;  Location: [facility];  Service: Urology;  Laterality: Left;  request digital flexible ureteroscope  . Cystoscopy with retrograde pyelogram,  ureteroscopy and stent placement Left 08/23/2014    Procedure: CYSTOSCOPY WITH RETROGRADE PYELOGRAM, URETEROSCOPY AND STENT EXCHANGE;  Surgeon: Heloise Purpura, MD;  Location: [facility];  Service: Urology;  Laterality: Left;  . Holmium laser application Left 08/23/2014    Procedure: HOLMIUM LASER APPLICATION;  Surgeon: Heloise Purpura, MD;  Location: [facility];  Service: Urology;  Laterality: Left;   Family History  Problem Relation Age of Onset  . Hyperlipidemia Mother    History  Substance Use Topics  . Smoking status: Never Smoker   . Smokeless tobacco: Never Used  . Alcohol Use: No   OB History    No data available     Review of Systems  All other systems reviewed and are negative.     Allergies  Codeine; Strawberry; and Vibramycin  Home Medications   Prior to Admission medications   Medication Sig Start Date End Date Taking? Authorizing Provider  albuterol (PROVENTIL HFA;VENTOLIN HFA) 108 (90 BASE) MCG/ACT inhaler Inhale 1 puff into the lungs every 4 (four) hours as needed for wheezing or shortness of breath. 01/20/15  Yes Andrena Mews, DO  ALPRAZolam Prudy Feeler) 0.5 MG tablet Take 0.5 mg by mouth at bedtime.    Yes Historical Provider, MD  benzonatate (TESSALON) 100 MG capsule Take 1 capsule (100 mg total) by mouth 2 (two) times daily as needed for cough. 01/20/15  Yes Andrena Mews, DO  cetirizine (ZYRTEC) 10 MG tablet Take 10 mg by  mouth at bedtime.    Yes Historical Provider, MD  naproxen sodium (ANAPROX) 220 MG tablet Take 440 mg by mouth 2 (two) times daily with a meal.   Yes Historical Provider, MD  sertraline (ZOLOFT) 100 MG tablet Take 100 mg by mouth at bedtime.    Yes Historical Provider, MD   BP 187/92 mmHg  Pulse 67  Temp(Src) 98.4 F (36.9 C) (Oral)  Resp 19  SpO2 95% Physical Exam  Constitutional: No distress.  Obese   HENT:  Head: Normocephalic and atraumatic.  Right Ear: External ear normal.  Left Ear: External ear normal.  Eyes: Conjunctivae are normal.  Right eye exhibits no discharge. Left eye exhibits no discharge. No scleral icterus.  Neck: Neck supple. No tracheal deviation present.  Cardiovascular: Normal rate, regular rhythm and intact distal pulses.   Pulmonary/Chest: Effort normal and breath sounds normal. No stridor. No respiratory distress. She has no wheezes. She has no rales.  Abdominal: Soft. Bowel sounds are normal. She exhibits no distension and no pulsatile midline mass. There is no tenderness. There is no rebound and no guarding.  Musculoskeletal: She exhibits no edema or tenderness.  Neurological: She is alert. She has normal strength. No cranial nerve deficit (no facial droop, extraocular movements intact, no slurred speech) or sensory deficit. She exhibits normal muscle tone. She displays no seizure activity. Coordination normal.  Skin: Skin is warm and dry. No rash noted. She is not diaphoretic.  Psychiatric: She has a normal mood and affect.  Nursing note and vitals reviewed.   ED Course  Procedures (including critical care time) Labs Review Labs Reviewed  CBC - Abnormal; Notable for the following:    RBC 5.23 (*)    Hemoglobin 15.1 (*)    Platelets 109 (*)    All other components within normal limits  BASIC METABOLIC PANEL - Abnormal; Notable for the following:    Potassium 2.9 (*)    Glucose, Bld 258 (*)    Creatinine, Ser 0.46 (*)    All other components within normal limits  URINALYSIS, ROUTINE W REFLEX MICROSCOPIC - Abnormal; Notable for the following:    Specific Gravity, Urine 1.035 (*)    Glucose, UA >1000 (*)    Hgb urine dipstick TRACE (*)    Protein, ur 30 (*)    All other components within normal limits  HEPATIC FUNCTION PANEL - Abnormal; Notable for the following:    ALT 37 (*)    All other components within normal limits  URINE MICROSCOPIC-ADD ON - Abnormal; Notable for the following:    Casts HYALINE CASTS (*)    All other components within normal limits  LIPASE, BLOOD  D-DIMER,  QUANTITATIVE  I-STAT TROPOININ, ED  I-STAT TROPOININ, ED  I-STAT TROPOININ, ED    Imaging Review Ct Abdomen Pelvis W Contrast  01/24/2015   CLINICAL DATA:  54 year old female with epigastric pain and nausea and vomiting which began this morning.  EXAM: CT ABDOMEN AND PELVIS WITH CONTRAST  TECHNIQUE: Multidetector CT imaging of the abdomen and pelvis was performed using the standard protocol following bolus administration of intravenous contrast.  CONTRAST:  100mL OMNIPAQUE IOHEXOL 300 MG/ML  SOLN  COMPARISON:  CT of the abdomen and pelvis 08/10/2014.  FINDINGS: Lower chest:  Mild scarring in the right lower lobe posteriorly.  Hepatobiliary: Multiple small locules of gas are noted non dependently in the lumen of the gallbladder, presumably gas within multiple noncalcified floating gallstones. No evidence of acute cholecystitis at this time. No cystic  or solid hepatic lesions. No intra or extrahepatic biliary ductal dilatation.  Pancreas: Unremarkable.  Spleen: Unremarkable.  Adrenals/Urinary Tract: Normal appearance of the adrenal glands bilaterally. 8 mm low attenuation lesion in the upper pole of the left kidney is too small to characterize, but is statistically likely a tiny cyst. Right kidney is normal in appearance. No hydroureteronephrosis. Urinary bladder is normal in appearance.  Stomach/Bowel: A LapBand is noted, and appears appropriately positioned. Stomach is otherwise unremarkable in appearance. Status post appendectomy. No pathologic dilatation of small bowel or colon. A few scattered colonic diverticulae are noted, particularly in the sigmoid colon, without surrounding inflammatory changes to suggest an acute diverticulitis at this time.  Vascular/Lymphatic: Minimal atherosclerosis is noted in the abdominal and pelvic vasculature. No evidence of aneurysm or dissection. Multiple borderline enlarged upper abdominal lymph nodes, similar to prior study from 08/10/2014, presumably benign and  reactive. No lymphadenopathy noted in the abdomen or pelvis.  Reproductive: Status post hysterectomy. Ovaries are unremarkable in appearance.  Other: No significant volume of ascites. No pneumoperitoneum. Port for infusion of the lap band noted in the subcutaneous fat of the right anterior abdominal wall.  Musculoskeletal: Small umbilical hernia containing only omental fat. There are no aggressive appearing lytic or blastic lesions noted in the visualized portions of the skeleton.  IMPRESSION: 1. No acute findings in the abdomen or pelvis to account for the patient's symptoms. 2. Cholelithiasis without findings to suggest acute cholecystitis at this time. 3. Small umbilical hernia containing only omental fat incidentally noted and unchanged. 4. Very mild colonic diverticulosis without findings to suggest acute diverticulitis at this time. 5. Status post LapBand placement. 6. Additional incidental findings, as above.   Electronically Signed   By: Trudie Reed M.D.   On: 01/24/2015 14:14   Dg Abd Acute W/chest  01/24/2015   CLINICAL DATA:  54 year old female with mid chest pain, nausea vomiting and coughing since 0400 hours. Initial encounter.  EXAM: ACUTE ABDOMEN SERIES (ABDOMEN 2 VIEW & CHEST 1 VIEW)  COMPARISON:  CT Abdomen and Pelvis 08/10/2014.  FINDINGS: Mildly low lung volumes. No pneumothorax or pneumoperitoneum. No pleural effusion or confluent pulmonary opacity.  Sequelae of gastric banding re - identified. Stable orientation of the gastric band since 2015. Stable epigastric and right lower quadrant surgical clips. Non obstructed bowel gas pattern. Abdominal and pelvic visceral contours are within normal limits. No acute osseous abnormality identified.  IMPRESSION: 1.  Normal bowel gas pattern, no free air. 2.  No acute cardiopulmonary abnormality.   Electronically Signed   By: Odessa Fleming M.D.   On: 01/24/2015 07:56     EKG Interpretation   Date/Time:  Monday January 24 2015 07:12:29  EST Ventricular Rate:  65 PR Interval:  146 QRS Duration: 83 QT Interval:  479 QTC Calculation: 498 R Axis:     Text Interpretation:  Sinus rhythm Atrial premature complex Abnormal  R-wave progression, early transition Borderline repolarization abnormality  Borderline prolonged QT interval Baseline wander in lead(s) III V2 V6 Poor  data quality Confirmed by Demitri Kucinski  MD-J, Wendelin Bradt ([id number]) on 01/24/2015 7:21:42 AM     Medications  0.9 %  sodium chloride infusion (0 mLs Intravenous Stopped 01/24/15 0934)    Followed by  0.9 %  sodium chloride infusion (1,000 mLs Intravenous New Bag/Given 01/24/15 0753)  ondansetron (ZOFRAN) injection 4 mg (4 mg Intravenous Given 01/24/15 0752)  morphine 4 MG/ML injection 4 mg (4 mg Intravenous Given 01/24/15 0752)  gi cocktail (Maalox,Lidocaine,Donnatal) (30 mLs Oral  Given 01/24/15 0756)  potassium chloride SA (K-DUR,KLOR-CON) CR tablet 40 mEq (40 mEq Oral Given 01/24/15 0826)  morphine 4 MG/ML injection 4 mg (4 mg Intravenous Given 01/24/15 0934)  pantoprazole (PROTONIX) injection 40 mg (40 mg Intravenous Given 01/24/15 0934)  iohexol (OMNIPAQUE) 300 MG/ML solution 50 mL (50 mLs Oral Contrast Given 01/24/15 1157)  iohexol (OMNIPAQUE) 300 MG/ML solution 100 mL (100 mLs Intravenous Contrast Given 01/24/15 1339)    MDM   Final diagnoses:  Abdominal pain, epigastric  Gallstones  Gastroesophageal reflux disease, esophagitis presence not specified   Doubt cardiac etiology. Negative enzymes times two.  Pt is having more GI sx.  CT scan shows gallstones but no ttp in ruq.  No cholecystitis.  Pt has been burping.  Suspect sx could be related to GERD, esophageal spasm.  At this time there does not appear to be any evidence of an acute emergency medical condition and the patient appears stable for discharge with appropriate outpatient follow up.  DC with antacids and pain meds.  Outpatient follow up    Linwood Dibbles, MD 01/24/15 1505

## 2015-01-24 NOTE — ED Notes (Signed)
MD at bedside. 

## 2015-01-24 NOTE — Discharge Instructions (Signed)

## 2015-01-24 NOTE — ED Notes (Signed)
Patient transported to X-ray 

## 2015-01-24 NOTE — ED Notes (Signed)
Patient transported to CT 

## 2015-01-24 NOTE — ED Notes (Signed)
Pt states that with her lupus she was told before that her heart was swollen.  Pt is concerned about this at this time.

## 2015-01-24 NOTE — ED Notes (Signed)
Pt presents with c/o chest pain that started around 4 this morning. Pt reports she went to urgent care last week for a cough, questionable bronchitis. Pt reports the pain is in the center of her chest, feels like pressure.

## 2016-02-19 ENCOUNTER — Encounter (HOSPITAL_COMMUNITY): Payer: Self-pay | Admitting: Emergency Medicine

## 2016-02-19 ENCOUNTER — Emergency Department (HOSPITAL_COMMUNITY): Payer: Self-pay

## 2016-02-19 ENCOUNTER — Emergency Department (HOSPITAL_COMMUNITY)
Admission: EM | Admit: 2016-02-19 | Discharge: 2016-02-19 | Disposition: A | Discharge status: Home / Self Care | Payer: Self-pay | Attending: Emergency Medicine | Admitting: Emergency Medicine

## 2016-02-19 DIAGNOSIS — Z79899 Other long term (current) drug therapy: Secondary | ICD-10-CM | POA: Insufficient documentation

## 2016-02-19 DIAGNOSIS — Z9049 Acquired absence of other specified parts of digestive tract: Secondary | ICD-10-CM | POA: Insufficient documentation

## 2016-02-19 DIAGNOSIS — Z90710 Acquired absence of both cervix and uterus: Secondary | ICD-10-CM | POA: Insufficient documentation

## 2016-02-19 DIAGNOSIS — N200 Calculus of kidney: Secondary | ICD-10-CM | POA: Insufficient documentation

## 2016-02-19 DIAGNOSIS — M1990 Unspecified osteoarthritis, unspecified site: Secondary | ICD-10-CM | POA: Insufficient documentation

## 2016-02-19 DIAGNOSIS — F419 Anxiety disorder, unspecified: Secondary | ICD-10-CM | POA: Insufficient documentation

## 2016-02-19 DIAGNOSIS — F329 Major depressive disorder, single episode, unspecified: Secondary | ICD-10-CM | POA: Insufficient documentation

## 2016-02-19 DIAGNOSIS — J45909 Unspecified asthma, uncomplicated: Secondary | ICD-10-CM | POA: Insufficient documentation

## 2016-02-19 LAB — COMPREHENSIVE METABOLIC PANEL
ALT: 45 U/L (ref 14–54)
ANION GAP: 12 (ref 5–15)
AST: 40 U/L (ref 15–41)
Albumin: 4.6 g/dL (ref 3.5–5.0)
Alkaline Phosphatase: 69 U/L (ref 38–126)
BUN: 11 mg/dL (ref 6–20)
CALCIUM: 9.7 mg/dL (ref 8.9–10.3)
CO2: 27 mmol/L (ref 22–32)
Chloride: 101 mmol/L (ref 101–111)
Creatinine, Ser: 0.69 mg/dL (ref 0.44–1.00)
GFR calc Af Amer: >60 mL/min (ref >60)
GFR calc non Af Amer: >60 mL/min (ref >60)
GLUCOSE: 220 mg/dL — AB (ref 65–99)
Potassium: 4.1 mmol/L (ref 3.5–5.1)
SODIUM: 140 mmol/L (ref 135–145)
Total Bilirubin: 0.7 mg/dL (ref 0.3–1.2)
Total Protein: 8.7 g/dL — ABNORMAL HIGH (ref 6.5–8.1)

## 2016-02-19 LAB — CBC WITH DIFFERENTIAL/PLATELET
Basophils Absolute: 0 10*3/uL (ref 0.0–0.1)
Basophils Relative: 0 %
EOS ABS: 0.1 10*3/uL (ref 0.0–0.7)
Eosinophils Relative: 1 %
HCT: 46.3 % — ABNORMAL HIGH (ref 36.0–46.0)
HEMOGLOBIN: 16 g/dL — AB (ref 12.0–15.0)
Lymphocytes Relative: 19 %
Lymphs Abs: 1.6 10*3/uL (ref 0.7–4.0)
MCH: 29.5 pg (ref 26.0–34.0)
MCHC: 34.6 g/dL (ref 30.0–36.0)
MCV: 85.4 fL (ref 78.0–100.0)
MONOS PCT: 5 %
Monocytes Absolute: 0.4 10*3/uL (ref 0.1–1.0)
NEUTROS PCT: 75 %
Neutro Abs: 6.4 10*3/uL (ref 1.7–7.7)
Platelets: 180 10*3/uL (ref 150–400)
RBC: 5.42 MIL/uL — ABNORMAL HIGH (ref 3.87–5.11)
RDW: 13.2 % (ref 11.5–15.5)
WBC: 8.5 10*3/uL (ref 4.0–10.5)

## 2016-02-19 LAB — URINE MICROSCOPIC-ADD ON

## 2016-02-19 LAB — URINALYSIS, ROUTINE W REFLEX MICROSCOPIC
Bilirubin Urine: NEGATIVE
Glucose, UA: 250 mg/dL — AB
KETONES UR: NEGATIVE mg/dL
NITRITE: NEGATIVE
Protein, ur: 100 mg/dL — AB
Specific Gravity, Urine: 1.02 (ref 1.005–1.030)
pH: 5.5 (ref 5.0–8.0)

## 2016-02-19 MED ORDER — ONDANSETRON 4 MG PO TBDP
4.0000 mg | ORAL_TABLET | Freq: Once | ORAL | Status: AC | PRN
  Administered 2016-02-19: 4 mg via ORAL
  Filled 2016-02-19: qty 1

## 2016-02-19 MED ORDER — KETOROLAC TROMETHAMINE 30 MG/ML IJ SOLN
30.0000 mg | Freq: Once | INTRAMUSCULAR | Status: AC
  Administered 2016-02-19: 30 mg via INTRAVENOUS
  Filled 2016-02-19: qty 1

## 2016-02-19 MED ORDER — TAMSULOSIN HCL 0.4 MG PO CAPS: 0.4000 mg | ORAL_CAPSULE | Freq: Every day | ORAL | Status: DC

## 2016-02-19 MED ORDER — ONDANSETRON HCL 4 MG/2ML IJ SOLN
4.0000 mg | Freq: Once | INTRAMUSCULAR | Status: AC
  Administered 2016-02-19: 4 mg via INTRAVENOUS
  Filled 2016-02-19: qty 2

## 2016-02-19 MED ORDER — CEPHALEXIN 500 MG PO CAPS
500.0000 mg | ORAL_CAPSULE | Freq: Once | ORAL | Status: AC
  Administered 2016-02-19: 500 mg via ORAL
  Filled 2016-02-19: qty 1

## 2016-02-19 MED ORDER — OXYCODONE-ACETAMINOPHEN 5-325 MG PO TABS: 2.0000 | ORAL_TABLET | ORAL | Status: DC | PRN

## 2016-02-19 MED ORDER — HYDROMORPHONE HCL 1 MG/ML IJ SOLN
1.0000 mg | Freq: Once | INTRAMUSCULAR | Status: AC
Start: 2016-02-19 — End: 2016-02-19
  Administered 2016-02-19: 1 mg via INTRAVENOUS
  Filled 2016-02-19: qty 1

## 2016-02-19 MED ORDER — CEPHALEXIN 500 MG PO CAPS: 500.0000 mg | ORAL_CAPSULE | Freq: Four times a day (QID) | ORAL | Status: DC

## 2016-02-19 MED ORDER — ONDANSETRON 4 MG PO TBDP: ORAL_TABLET | ORAL | Status: DC

## 2016-02-19 NOTE — ED Provider Notes (Signed)
CSN: [id number]     Arrival date & time 02/19/16  1431 History   First MD Initiated Contact with Patient 02/19/16 1606     Chief Complaint  Patient presents with  . Flank Pain     (Consider location/radiation/quality/duration/timing/severity/associated sxs/prior Treatment) Patient is a 55 y.o. female presenting with flank pain. The history is provided by the patient (Patient complains of right flank pain. Patient states she has a history of kidney stones).  Flank Pain This is a new problem. The current episode started 6 to 12 hours ago. The problem occurs constantly. The problem has not changed since onset.Associated symptoms include abdominal pain. Pertinent negatives include no chest pain and no headaches. Nothing aggravates the symptoms. Nothing relieves the symptoms.    Past Medical History  Diagnosis Date  . Lupus (HCC)   . Anxiety   . Depression   . History of kidney stones 1998, 2015  . Asthma     perfumes triggers attacks. Has inhalers for rescue  . Allergy   . Arthritis    Past Surgical History  Procedure Laterality Date  . Abdominal hysterectomy  2004  . Appendectomy  1992  . Rotator cuff repair Right 2007  . Cystoscopy with retrograde pyelogram, ureteroscopy and stent placement Left 08/11/2014    Procedure: CYSTOSCOPY WITH RETROGRADE PYELOGRAM, URETEROSCOPY AND STENT PLACEMENT,  DIGITAL FLEXIBLE URETEROSCOPE;  Surgeon: Heloise PurpuraLester Borden, MD;  Location: [facility];  Service: Urology;  Laterality: Left;  request digital flexible ureteroscope  . Cystoscopy with retrograde pyelogram, ureteroscopy and stent placement Left 08/23/2014    Procedure: CYSTOSCOPY WITH RETROGRADE PYELOGRAM, URETEROSCOPY AND STENT EXCHANGE;  Surgeon: Heloise PurpuraLester Borden, MD;  Location: [facility];  Service: Urology;  Laterality: Left;  . Holmium laser application Left 08/23/2014    Procedure: HOLMIUM LASER APPLICATION;  Surgeon: Heloise PurpuraLester Borden, MD;  Location: [facility];  Service: Urology;  Laterality: Left;   Family  History  Problem Relation Age of Onset  . Hyperlipidemia Mother    Social History  Substance Use Topics  . Smoking status: Never Smoker   . Smokeless tobacco: Never Used  . Alcohol Use: No   OB History    No data available     Review of Systems  Constitutional: Negative for appetite change and fatigue.  HENT: Negative for congestion, ear discharge and sinus pressure.   Eyes: Negative for discharge.  Respiratory: Negative for cough.   Cardiovascular: Negative for chest pain.  Gastrointestinal: Positive for abdominal pain. Negative for diarrhea.  Genitourinary: Positive for flank pain. Negative for frequency and hematuria.  Musculoskeletal: Negative for back pain.  Skin: Negative for rash.  Neurological: Negative for seizures and headaches.  Psychiatric/Behavioral: Negative for hallucinations.      Allergies  Codeine; Strawberry extract; and Vibramycin  Home Medications   Prior to Admission medications   Medication Sig Start Date End Date Taking? Authorizing Provider  ALPRAZolam Prudy Feeler(XANAX) 0.5 MG tablet Take 0.5 mg by mouth at bedtime.    Yes Historical Provider, MD  cetirizine (ZYRTEC) 10 MG tablet Take 10 mg by mouth at bedtime.    Yes Historical Provider, MD  ibuprofen (ADVIL,MOTRIN) 200 MG tablet Take 400 mg by mouth every 6 (six) hours as needed for moderate pain.   Yes Historical Provider, MD  sertraline (ZOLOFT) 100 MG tablet Take 100 mg by mouth at bedtime.    Yes Historical Provider, MD  albuterol (PROVENTIL HFA;VENTOLIN HFA) 108 (90 BASE) MCG/ACT inhaler Inhale 1 puff into the lungs every 4 (four) hours as needed  for wheezing or shortness of breath. Patient not taking: Reported on 02/19/2016 01/20/15   Andrena Mews, DO  benzonatate (TESSALON) 100 MG capsule Take 1 capsule (100 mg total) by mouth 2 (two) times daily as needed for cough. Patient not taking: Reported on 02/19/2016 01/20/15   Andrena Mews, DO  cephALEXin (KEFLEX) 500 MG capsule Take 1 capsule (500 mg  total) by mouth 4 (four) times daily. 02/19/16   Bethann Berkshire, MD  HYDROcodone-acetaminophen (NORCO/VICODIN) 5-325 MG per tablet Take 1-2 tablets by mouth every 4 (four) hours as needed. Patient not taking: Reported on 02/19/2016 01/24/15   Linwood Dibbles, MD  ondansetron (ZOFRAN ODT) 4 MG disintegrating tablet  ODT q4 hours prn nausea/vomit 02/19/16   Bethann Berkshire, MD  oxyCODONE-acetaminophen (PERCOCET) 5-325 MG tablet Take 2 tablets by mouth every 4 (four) hours as needed. 02/19/16   Bethann Berkshire, MD  pantoprazole (PROTONIX) 20 MG tablet Take 1 tablet (20 mg total) by mouth daily. Patient not taking: Reported on 02/19/2016 01/24/15   Linwood Dibbles, MD  tamsulosin [facility]) 0.4 MG CAPS capsule Take 1 capsule (0.4 mg total) by mouth daily after breakfast. 02/19/16   Bethann Berkshire, MD   BP 143/81 mmHg  Pulse 98  Temp(Src) 98.3 F (36.8 C) (Oral)  Resp 16  SpO2 97% Physical Exam  Constitutional: She is oriented to person, place, and time. She appears well-developed.  HENT:  Head: Normocephalic.  Eyes: Conjunctivae and EOM are normal. No scleral icterus.  Neck: Neck supple. No thyromegaly present.  Cardiovascular: Normal rate and regular rhythm.  Exam reveals no gallop and no friction rub.   No murmur heard. Pulmonary/Chest: No stridor. She has no wheezes. She has no rales. She exhibits no tenderness.  Abdominal: She exhibits no distension. There is no tenderness. There is no rebound.  Genitourinary:  Tender right flank moderate  Musculoskeletal: Normal range of motion. She exhibits no edema.  Lymphadenopathy:    She has no cervical adenopathy.  Neurological: She is oriented to person, place, and time. She exhibits normal muscle tone. Coordination normal.  Skin: No rash noted. No erythema.  Psychiatric: She has a normal mood and affect. Her behavior is normal.    ED Course  Procedures (including critical care time) Labs Review Labs Reviewed  URINALYSIS, ROUTINE W REFLEX MICROSCOPIC (NOT AT  [facility]) - Abnormal; Notable for the following:    Color, Urine AMBER (*)    APPearance CLOUDY (*)    Glucose, UA 250 (*)    Hgb urine dipstick LARGE (*)    Protein, ur 100 (*)    Leukocytes, UA SMALL (*)    All other components within normal limits  CBC WITH DIFFERENTIAL/PLATELET - Abnormal; Notable for the following:    RBC 5.42 (*)    Hemoglobin 16.0 (*)    HCT 46.3 (*)    All other components within normal limits  COMPREHENSIVE METABOLIC PANEL - Abnormal; Notable for the following:    Glucose, Bld 220 (*)    Total Protein 8.7 (*)    All other components within normal limits  URINE MICROSCOPIC-ADD ON - Abnormal; Notable for the following:    Squamous Epithelial / LPF 0-5 (*)    Bacteria, UA FEW (*)    All other components within normal limits  URINE CULTURE    Imaging Review Ct Renal Stone Study  02/19/2016  CLINICAL DATA:  Right flank pain and history of kidney stones. EXAM: CT ABDOMEN AND PELVIS WITHOUT CONTRAST TECHNIQUE: Multidetector CT imaging of  the abdomen and pelvis was performed following the standard protocol without IV contrast. COMPARISON:  01/24/2015 FINDINGS: Lower chest and abdominal wall: Fatty umbilical hernia without acute inflammation or change. Unremarkable lap band tubing with reservoir in the right abdomen. Atherosclerotic calcifications seen at the right coronary ostium. Hepatobiliary: No focal liver abnormality.Numerous gallstones containing gas. No evidence of cholecystitis. Pancreas: Unremarkable. Spleen: Unremarkable. Adrenals/Urinary Tract:  Negative adrenals. 2 mm right UVJ calculus with mild hydronephrosis and subtle perinephric edema. There is a tiny right lower pole calculus. No left hydronephrosis. Sub cm cyst at the left upper pole. Unremarkable bladder. Reproductive:Hysterectomy with negative adnexa Stomach/Bowel: Diverticulosis primarily of the sigmoid. No bowel obstruction or inflammation. Appendectomy. Lab band without signs of slip or perforation.  Vascular/Lymphatic: No acute vascular abnormality. No mass or adenopathy. Peritoneal: No ascites or pneumoperitoneum. Musculoskeletal: Diffuse spondylosis. IMPRESSION: 1. 2 mm right UVJ calculus with mild hydronephrosis. 2. Cholelithiasis and other chronic findings are stable from 2016 and described above. Electronically Signed   By: Marnee Spring M.D.   On: 02/19/2016 18:11   I have personally reviewed and evaluated these images and lab results as part of my medical decision-making.   EKG Interpretation None      MDM   Final diagnoses:  Kidney stone    Patient with a 2 mm stone right UVJ. She'll be sent home with Percocets Zofran Flomax and Keflex for possible urinary tract infection will follow-up with urology    Bethann Berkshire, MD 02/19/16 [phone]

## 2016-02-19 NOTE — ED Notes (Signed)
Pt states right sided flank pain starting yesterday with nausea. Hx of kidney stones. Pt states when she urinates there's a small amount of blood in the urine.

## 2016-02-19 NOTE — Discharge Instructions (Signed)
Follow up with [facility] this week. °

## 2016-02-21 LAB — URINE CULTURE: Culture: 2000

## 2017-03-10 ENCOUNTER — Emergency Department (HOSPITAL_COMMUNITY)
Admission: EM | Admit: 2017-03-10 | Discharge: 2017-03-10 | Disposition: A | Discharge status: Home / Self Care | Payer: BLUE CROSS/BLUE SHIELD | Attending: Emergency Medicine | Admitting: Emergency Medicine

## 2017-03-10 ENCOUNTER — Emergency Department (HOSPITAL_COMMUNITY): Payer: BLUE CROSS/BLUE SHIELD

## 2017-03-10 DIAGNOSIS — J45909 Unspecified asthma, uncomplicated: Secondary | ICD-10-CM | POA: Insufficient documentation

## 2017-03-10 DIAGNOSIS — R0789 Other chest pain: Secondary | ICD-10-CM | POA: Insufficient documentation

## 2017-03-10 DIAGNOSIS — Z79899 Other long term (current) drug therapy: Secondary | ICD-10-CM | POA: Insufficient documentation

## 2017-03-10 DIAGNOSIS — Z7982 Long term (current) use of aspirin: Secondary | ICD-10-CM | POA: Insufficient documentation

## 2017-03-10 DIAGNOSIS — R1013 Epigastric pain: Secondary | ICD-10-CM

## 2017-03-10 LAB — CBC
HCT: 45.1 % (ref 36.0–46.0)
HEMOGLOBIN: 16 g/dL — AB (ref 12.0–15.0)
MCH: 28.8 pg (ref 26.0–34.0)
MCHC: 35.5 g/dL (ref 30.0–36.0)
MCV: 81.3 fL (ref 78.0–100.0)
Platelets: 166 10*3/uL (ref 150–400)
RBC: 5.55 MIL/uL — AB (ref 3.87–5.11)
RDW: 13.4 % (ref 11.5–15.5)
WBC: 7.3 10*3/uL (ref 4.0–10.5)

## 2017-03-10 LAB — URINALYSIS, ROUTINE W REFLEX MICROSCOPIC
Bacteria, UA: NONE SEEN
Bilirubin Urine: NEGATIVE
Glucose, UA: >=500 mg/dL — AB
Ketones, ur: 5 mg/dL — AB
Leukocytes, UA: NEGATIVE
Nitrite: NEGATIVE
PH: 6 (ref 5.0–8.0)
Protein, ur: 30 mg/dL — AB
Specific Gravity, Urine: 1.009 (ref 1.005–1.030)
Squamous Epithelial / LPF: NONE SEEN

## 2017-03-10 LAB — HEPATIC FUNCTION PANEL
ALT: 58 U/L — AB (ref 14–54)
AST: 61 U/L — AB (ref 15–41)
Albumin: 4.2 g/dL (ref 3.5–5.0)
Alkaline Phosphatase: 71 U/L (ref 38–126)
BILIRUBIN DIRECT: 0.2 mg/dL (ref 0.1–0.5)
BILIRUBIN TOTAL: 0.6 mg/dL (ref 0.3–1.2)
Indirect Bilirubin: 0.4 mg/dL (ref 0.3–0.9)
Total Protein: 8.9 g/dL — ABNORMAL HIGH (ref 6.5–8.1)

## 2017-03-10 LAB — BASIC METABOLIC PANEL
ANION GAP: 8 (ref 5–15)
BUN: 7 mg/dL (ref 6–20)
CALCIUM: 9.1 mg/dL (ref 8.9–10.3)
CO2: 28 mmol/L (ref 22–32)
Chloride: 100 mmol/L — ABNORMAL LOW (ref 101–111)
Creatinine, Ser: 0.56 mg/dL (ref 0.44–1.00)
Glucose, Bld: 250 mg/dL — ABNORMAL HIGH (ref 65–99)
Potassium: 3.7 mmol/L (ref 3.5–5.1)
SODIUM: 136 mmol/L (ref 135–145)

## 2017-03-10 LAB — LIPASE, BLOOD: LIPASE: 17 U/L (ref 11–51)

## 2017-03-10 LAB — I-STAT TROPONIN, ED: TROPONIN I, POC: 0 ng/mL (ref 0.00–0.08)

## 2017-03-10 MED ORDER — MORPHINE SULFATE (PF) 2 MG/ML IV SOLN
4.0000 mg | Freq: Once | INTRAVENOUS | Status: AC
  Administered 2017-03-10: 4 mg via INTRAVENOUS
  Filled 2017-03-10: qty 2

## 2017-03-10 MED ORDER — GI COCKTAIL ~~LOC~~
30.0000 mL | Freq: Once | ORAL | Status: AC
  Administered 2017-03-10: 30 mL via ORAL
  Filled 2017-03-10: qty 30

## 2017-03-10 MED ORDER — OMEPRAZOLE 20 MG PO CPDR: 20.0000 mg | DELAYED_RELEASE_CAPSULE | Freq: Every day | ORAL | 0 refills | Status: DC

## 2017-03-10 MED ORDER — PROMETHAZINE HCL 25 MG/ML IJ SOLN
12.5000 mg | Freq: Once | INTRAMUSCULAR | Status: AC
  Administered 2017-03-10: 12.5 mg via INTRAVENOUS
  Filled 2017-03-10: qty 1

## 2017-03-10 MED ORDER — SODIUM CHLORIDE 0.9 % IV BOLUS (SEPSIS)
1000.0000 mL | Freq: Once | INTRAVENOUS | Status: AC
  Administered 2017-03-10: 1000 mL via INTRAVENOUS

## 2017-03-10 NOTE — ED Triage Notes (Signed)
Pt c/o progressive low central chest pain onset today at 0600, radiates in bandlike way to left and right lateral abdomen. No SOB, dizziness, arm or jaw pain/numbness/tingling.  Pttook Xanax and antacids for pain and anxiety without relief. Hx of similar pain but usually resolves with Xanax and Pepsid, but did not today.

## 2017-03-10 NOTE — ED Provider Notes (Signed)
[facility]-EMERGENCY DEPT Provider Note   CSN: [id number] Arrival date & time: 03/10/17  1428     History   Chief Complaint Chief Complaint  Patient presents with  . Chest Pain    HPI Shirley Sullivan is a 56 y.o. female gradual onset substernal heavy pressure somewhat radiating in epigastric region since 6 am upon awakening and now radiating to her back. She took 0.5 xanax and antiacid which typically resolves symptoms, but not this time. This feels like her previous episodes of acid reflux/anxiety but won't go away. No alleviating factors or anything making it worse. She also endorses nausea and vomiting since this morning. No sob or worsening c/p on exertion. She has been belching without relief. Denies, dysuria, hematuria, normal bm today at noon, no abdominal pain, fever, chills or other symptoms. Denies estrogen use, hx of DVT/PE, hemoptysis, cough, SOB, leg swelling, leg pain, hx of malignancy, recent immobilization or surgeries.  HPI  Past Medical History:  Diagnosis Date  . Allergy   . Anxiety   . Arthritis   . Asthma    perfumes triggers attacks. Has inhalers for rescue  . Depression   . History of kidney stones 1998, 2015  . Lupus     There are no active problems to display for this patient.   Past Surgical History:  Procedure Laterality Date  . ABDOMINAL HYSTERECTOMY  2004  . APPENDECTOMY  1992  . CYSTOSCOPY WITH RETROGRADE PYELOGRAM, URETEROSCOPY AND STENT PLACEMENT Left 08/11/2014   Procedure: CYSTOSCOPY WITH RETROGRADE PYELOGRAM, URETEROSCOPY AND STENT PLACEMENT,  DIGITAL FLEXIBLE URETEROSCOPE;  Surgeon: Heloise Purpura, MD;  Location: [facility];  Service: Urology;  Laterality: Left;  request digital flexible ureteroscope  . CYSTOSCOPY WITH RETROGRADE PYELOGRAM, URETEROSCOPY AND STENT PLACEMENT Left 08/23/2014   Procedure: CYSTOSCOPY WITH RETROGRADE PYELOGRAM, URETEROSCOPY AND STENT EXCHANGE;  Surgeon: Heloise Purpura, MD;  Location: [facility];  Service: Urology;  Laterality: Left;    . HOLMIUM LASER APPLICATION Left 08/23/2014   Procedure: HOLMIUM LASER APPLICATION;  Surgeon: Heloise Purpura, MD;  Location: [facility];  Service: Urology;  Laterality: Left;  . ROTATOR CUFF REPAIR Right 2007    OB History    No data available       Home Medications    Prior to Admission medications   Medication Sig Start Date End Date Taking? Authorizing Provider  ALPRAZolam Prudy Feeler) 0.5 MG tablet Take 0.5 mg by mouth at bedtime.    Yes Historical Provider, MD  aspirin EC 81 MG tablet Take 81 mg by mouth daily.   Yes Historical Provider, MD  cetirizine (ZYRTEC) 10 MG tablet Take 10 mg by mouth at bedtime.    Yes Historical Provider, MD  ibuprofen (ADVIL,MOTRIN) 200 MG tablet Take 400 mg by mouth every 6 (six) hours as needed for moderate pain (right knee pain).    Yes Historical Provider, MD  sertraline (ZOLOFT) 100 MG tablet Take 150 mg by mouth at bedtime.    Yes Historical Provider, MD  albuterol (PROVENTIL HFA;VENTOLIN HFA) 108 (90 BASE) MCG/ACT inhaler Inhale 1 puff into the lungs every 4 (four) hours as needed for wheezing or shortness of breath. Patient not taking: Reported on 02/19/2016 01/20/15   Andrena Mews, DO  benzonatate (TESSALON) 100 MG capsule Take 1 capsule (100 mg total) by mouth 2 (two) times daily as needed for cough. Patient not taking: Reported on 02/19/2016 01/20/15   Andrena Mews, DO  HYDROcodone-acetaminophen (NORCO/VICODIN) 5-325 MG per tablet Take 1-2 tablets by mouth every 4 (  four) hours as needed. Patient not taking: Reported on 02/19/2016 01/24/15   Linwood Dibbles, MD  omeprazole (PRILOSEC) 20 MG capsule Take 1 capsule (20 mg total) by mouth daily. 03/10/17 04/09/17  Georgiana Shore, PA-C  ondansetron (ZOFRAN ODT) 4 MG disintegrating tablet  ODT q4 hours prn nausea/vomit Patient not taking: Reported on 03/10/2017 02/19/16   Bethann Berkshire, MD  oxyCODONE-acetaminophen (PERCOCET) 5-325 MG tablet Take 2 tablets by mouth every 4 (four) hours as needed. Patient not  taking: Reported on 03/10/2017 02/19/16   Bethann Berkshire, MD  pantoprazole (PROTONIX) 20 MG tablet Take 1 tablet (20 mg total) by mouth daily. Patient not taking: Reported on 02/19/2016 01/24/15   Linwood Dibbles, MD  tamsulosin [facility]) 0.4 MG CAPS capsule Take 1 capsule (0.4 mg total) by mouth daily after breakfast. Patient not taking: Reported on 03/10/2017 02/19/16   Bethann Berkshire, MD    Family History Family History  Problem Relation Age of Onset  . Hyperlipidemia Mother     Social History Social History  Substance Use Topics  . Smoking status: Never Smoker  . Smokeless tobacco: Never Used  . Alcohol use No     Allergies   Codeine; Strawberry extract; and Vibramycin [doxycycline calcium]   Review of Systems Review of Systems  Constitutional: Negative for chills and fever.  HENT: Negative for congestion, ear pain, sore throat and trouble swallowing.   Eyes: Negative for pain and visual disturbance.  Respiratory: Negative for cough, choking, chest tightness, shortness of breath, wheezing and stridor.   Cardiovascular: Positive for chest pain. Negative for palpitations and leg swelling.  Gastrointestinal: Positive for nausea and vomiting. Negative for abdominal distention, abdominal pain, blood in stool, constipation and diarrhea.  Genitourinary: Negative for difficulty urinating, dysuria, flank pain and hematuria.  Musculoskeletal: Negative for arthralgias, back pain, myalgias, neck pain and neck stiffness.  Skin: Negative for color change and rash.  Neurological: Negative for dizziness, seizures, syncope, weakness, light-headedness, numbness and headaches.     Physical Exam Updated Vital Signs BP (!) 177/100 (BP Location: Right Arm)   Pulse 89   Temp 98.2 F (36.8 C) (Oral)   Resp 16   Ht  (1.626 m)   Wt 99.8 kg   SpO2 96%   BMI 37.76 kg/m   Physical Exam  Constitutional: She appears well-developed and well-nourished. No distress.  Afebrile, nontoxic appearing, lying  comfortably in bed in no acute distress.  HENT:  Head: Normocephalic and atraumatic.  Eyes: Conjunctivae and EOM are normal. Right eye exhibits no discharge. Left eye exhibits no discharge. No scleral icterus.  Neck: Normal range of motion.  Cardiovascular: Normal rate, regular rhythm, normal heart sounds and intact distal pulses.   No murmur heard. Pulmonary/Chest: Effort normal and breath sounds normal. No respiratory distress. She has no wheezes. She has no rales. She exhibits no tenderness.  Abdominal: Soft. Bowel sounds are normal. She exhibits no distension and no mass. There is no tenderness. There is no rebound and no guarding.  No cva tenderness. Negative Murphy sign, negative rebound, negative McBurney's point tenderness.  Musculoskeletal: Normal range of motion. She exhibits no edema, tenderness or deformity.  Neurological: She is alert.  Skin: Skin is warm and dry. No rash noted. She is not diaphoretic. No erythema. No pallor.  Psychiatric: She has a normal mood and affect.  Nursing note and vitals reviewed.    ED Treatments / Results  Labs (all labs ordered are listed, but only abnormal results are displayed) Labs Reviewed  BASIC METABOLIC PANEL - Abnormal; Notable for the following:       Result Value   Chloride 100 (*)    Glucose, Bld 250 (*)    All other components within normal limits  CBC - Abnormal; Notable for the following:    RBC 5.55 (*)    Hemoglobin 16.0 (*)    All other components within normal limits  URINALYSIS, ROUTINE W REFLEX MICROSCOPIC - Abnormal; Notable for the following:    Glucose, UA >=500 (*)    Hgb urine dipstick SMALL (*)    Ketones, ur 5 (*)    Protein, ur 30 (*)    All other components within normal limits  HEPATIC FUNCTION PANEL - Abnormal; Notable for the following:    Total Protein 8.9 (*)    AST 61 (*)    ALT 58 (*)    All other components within normal limits  LIPASE, BLOOD  I-STAT TROPOININ, ED    EKG  EKG  Interpretation  Date/Time:  Sunday March 10 2017 14:33:19 EDT Ventricular Rate:  89 PR Interval:    QRS Duration: 68 QT Interval:  468 QTC Calculation: 570 R Axis:   -13 Text Interpretation:  Sinus rhythm Low voltage, precordial leads Abnormal R-wave progression, early transition Borderline T abnormalities, diffuse leads Prolonged QT interval Baseline wander in lead(s) II III aVF V3 V4 V5 No significant change since last tracing Confirmed by Ethelda Chick  MD, SAM [phone]) on 03/10/2017 3:20:40 PM       Radiology Dg Chest 2 View  Result Date: 03/10/2017 CLINICAL DATA:  Chest pain EXAM: CHEST  2 VIEW COMPARISON:  01/24/2015 FINDINGS: Lungs are clear.  No pleural effusion or pneumothorax. The heart is normal in size. Degenerative changes of the visualized thoracolumbar spine. IMPRESSION: Normal chest radiographs. Electronically Signed   By: Charline Bills M.D.   On: 03/10/2017 15:17    Procedures Procedures (including critical care time)  Medications Ordered in ED Medications  morphine 2 MG/ML injection 4 mg (4 mg Intravenous Given 03/10/17 1704)  sodium chloride 0.9 % bolus 1,000 mL (1,000 mLs Intravenous New Bag/Given 03/10/17 1704)  promethazine (PHENERGAN) injection 12.5 mg (12.5 mg Intravenous Given 03/10/17 1705)  gi cocktail (Maalox,Lidocaine,Donnatal) (30 mLs Oral Given 03/10/17 1935)     Initial Impression / Assessment and Plan / ED Course  I have reviewed the triage vital signs and the nursing notes.  Pertinent labs & imaging results that were available during my care of the patient were reviewed by me and considered in my medical decision making (see chart for details).     Patient presenting with substernal chest pain radiating to her epigastric region and through to her back. She has had similar symptoms in the past which are typically relieved with Xanax and Pepcid. Labs unremarkable, ekg without significant changes.  Given IV fluids, pain management and Phenergan given QT  prolongation on EKG. On reassessment, patient reported improvement but still was feeling pressure worse with lying down and better with sitting up. Discussed GI cocktail and patient reported taking a GI cocktail with relief in the past. On reassessment, she had significantly improved and was ready to go home.  Low suspicion for PE in this patient. Wells criteria: 0  Reassuring exam and workup.  Heart score: 2  Suspect that this could be due to GERD and advised patient to try PPI. Provided education on ways to prevent GERD symptoms. Dc home with close follow up with PCP with LFTs recheck.  Discussed strict  return precautions and advised to return to the emergency department if experiencing any new or worsening symptoms. Instructions were understood and patient agreed with discharge plan. Final Clinical Impressions(s) / ED Diagnoses   Final diagnoses:  Other chest pain  Epigastric pain    New Prescriptions New Prescriptions   OMEPRAZOLE (PRILOSEC) 20 MG CAPSULE    Take 1 capsule (20 mg total) by mouth daily.     Georgiana Shore, PA-C 03/11/17 1610    Arby Barrette, MD 03/12/17 [phone]

## 2017-03-10 NOTE — Discharge Instructions (Signed)
As discussed, follow up with your primary care provider if symptoms persist. Schedule an appointment to establish care with a primary care provider from the list provided. Return to the emergency department if you experience worsening chest pain and shortness worsened by activity, nausea, vomiting, sweat, dizziness, weakness or any other new concerning symptoms.

## 2020-10-09 ENCOUNTER — Emergency Department (HOSPITAL_COMMUNITY): Payer: Self-pay

## 2020-10-09 ENCOUNTER — Inpatient Hospital Stay (HOSPITAL_COMMUNITY)
Admission: EM | Admit: 2020-10-09 | Discharge: 2020-10-16 | DRG: 871 | Disposition: A | Discharge status: Home Health | Payer: Self-pay | Attending: Internal Medicine | Admitting: Internal Medicine

## 2020-10-09 ENCOUNTER — Encounter (HOSPITAL_COMMUNITY): Payer: Self-pay | Admitting: *Deleted

## 2020-10-09 ENCOUNTER — Other Ambulatory Visit: Payer: Self-pay

## 2020-10-09 DIAGNOSIS — Z83438 Family history of other disorder of lipoprotein metabolism and other lipidemia: Secondary | ICD-10-CM

## 2020-10-09 DIAGNOSIS — R6521 Severe sepsis with septic shock: Secondary | ICD-10-CM | POA: Diagnosis present

## 2020-10-09 DIAGNOSIS — D6959 Other secondary thrombocytopenia: Secondary | ICD-10-CM | POA: Diagnosis present

## 2020-10-09 DIAGNOSIS — J9601 Acute respiratory failure with hypoxia: Secondary | ICD-10-CM | POA: Diagnosis present

## 2020-10-09 DIAGNOSIS — Z8616 Personal history of COVID-19: Secondary | ICD-10-CM

## 2020-10-09 DIAGNOSIS — Z9884 Bariatric surgery status: Secondary | ICD-10-CM

## 2020-10-09 DIAGNOSIS — E1142 Type 2 diabetes mellitus with diabetic polyneuropathy: Secondary | ICD-10-CM | POA: Diagnosis present

## 2020-10-09 DIAGNOSIS — E872 Acidosis: Secondary | ICD-10-CM | POA: Diagnosis present

## 2020-10-09 DIAGNOSIS — G9341 Metabolic encephalopathy: Secondary | ICD-10-CM | POA: Diagnosis present

## 2020-10-09 DIAGNOSIS — N136 Pyonephrosis: Secondary | ICD-10-CM | POA: Diagnosis present

## 2020-10-09 DIAGNOSIS — A4159 Other Gram-negative sepsis: Principal | ICD-10-CM | POA: Diagnosis present

## 2020-10-09 DIAGNOSIS — A419 Sepsis, unspecified organism: Secondary | ICD-10-CM

## 2020-10-09 DIAGNOSIS — U071 COVID-19: Secondary | ICD-10-CM

## 2020-10-09 DIAGNOSIS — E876 Hypokalemia: Secondary | ICD-10-CM | POA: Diagnosis not present

## 2020-10-09 DIAGNOSIS — Z885 Allergy status to narcotic agent status: Secondary | ICD-10-CM

## 2020-10-09 DIAGNOSIS — N1330 Unspecified hydronephrosis: Secondary | ICD-10-CM

## 2020-10-09 DIAGNOSIS — I700 Atherosclerosis of aorta: Secondary | ICD-10-CM | POA: Diagnosis present

## 2020-10-09 DIAGNOSIS — K5730 Diverticulosis of large intestine without perforation or abscess without bleeding: Secondary | ICD-10-CM | POA: Diagnosis present

## 2020-10-09 DIAGNOSIS — N179 Acute kidney failure, unspecified: Secondary | ICD-10-CM | POA: Diagnosis present

## 2020-10-09 DIAGNOSIS — Z6834 Body mass index (BMI) 34.0-34.9, adult: Secondary | ICD-10-CM

## 2020-10-09 DIAGNOSIS — J45909 Unspecified asthma, uncomplicated: Secondary | ICD-10-CM | POA: Diagnosis present

## 2020-10-09 DIAGNOSIS — F419 Anxiety disorder, unspecified: Secondary | ICD-10-CM | POA: Diagnosis present

## 2020-10-09 DIAGNOSIS — K7460 Unspecified cirrhosis of liver: Secondary | ICD-10-CM | POA: Diagnosis present

## 2020-10-09 DIAGNOSIS — Z7984 Long term (current) use of oral hypoglycemic drugs: Secondary | ICD-10-CM

## 2020-10-09 DIAGNOSIS — N132 Hydronephrosis with renal and ureteral calculous obstruction: Secondary | ICD-10-CM

## 2020-10-09 DIAGNOSIS — K766 Portal hypertension: Secondary | ICD-10-CM | POA: Diagnosis present

## 2020-10-09 DIAGNOSIS — Z79899 Other long term (current) drug therapy: Secondary | ICD-10-CM

## 2020-10-09 DIAGNOSIS — Z888 Allergy status to other drugs, medicaments and biological substances status: Secondary | ICD-10-CM

## 2020-10-09 DIAGNOSIS — Z7982 Long term (current) use of aspirin: Secondary | ICD-10-CM

## 2020-10-09 DIAGNOSIS — I214 Non-ST elevation (NSTEMI) myocardial infarction: Secondary | ICD-10-CM | POA: Diagnosis not present

## 2020-10-09 DIAGNOSIS — M329 Systemic lupus erythematosus, unspecified: Secondary | ICD-10-CM | POA: Diagnosis present

## 2020-10-09 DIAGNOSIS — A414 Sepsis due to anaerobes: Secondary | ICD-10-CM

## 2020-10-09 DIAGNOSIS — I4891 Unspecified atrial fibrillation: Secondary | ICD-10-CM | POA: Diagnosis not present

## 2020-10-09 DIAGNOSIS — D649 Anemia, unspecified: Secondary | ICD-10-CM | POA: Diagnosis not present

## 2020-10-09 DIAGNOSIS — M1990 Unspecified osteoarthritis, unspecified site: Secondary | ICD-10-CM | POA: Diagnosis present

## 2020-10-09 DIAGNOSIS — Z881 Allergy status to other antibiotic agents status: Secondary | ICD-10-CM

## 2020-10-09 HISTORY — DX: COVID-19: U07.1

## 2020-10-09 HISTORY — DX: Type 2 diabetes mellitus without complications: E11.9

## 2020-10-09 LAB — COMPREHENSIVE METABOLIC PANEL
ALT: 30 U/L (ref 0–44)
AST: 43 U/L — ABNORMAL HIGH (ref 15–41)
Albumin: 2.3 g/dL — ABNORMAL LOW (ref 3.5–5.0)
Alkaline Phosphatase: 51 U/L (ref 38–126)
Anion gap: 18 — ABNORMAL HIGH (ref 5–15)
BUN: 38 mg/dL — ABNORMAL HIGH (ref 8–23)
CO2: 15 mmol/L — ABNORMAL LOW (ref 22–32)
Calcium: 8 mg/dL — ABNORMAL LOW (ref 8.9–10.3)
Chloride: 103 mmol/L (ref 98–111)
Creatinine, Ser: 3.49 mg/dL — ABNORMAL HIGH (ref 0.44–1.00)
GFR, Estimated: 14 mL/min — ABNORMAL LOW (ref >60)
Glucose, Bld: 110 mg/dL — ABNORMAL HIGH (ref 70–99)
Potassium: 4 mmol/L (ref 3.5–5.1)
Sodium: 136 mmol/L (ref 135–145)
Total Bilirubin: 0.8 mg/dL (ref 0.3–1.2)
Total Protein: 5.4 g/dL — ABNORMAL LOW (ref 6.5–8.1)

## 2020-10-09 LAB — CBC WITH DIFFERENTIAL/PLATELET
Abs Immature Granulocytes: 0.46 10*3/uL — ABNORMAL HIGH (ref 0.00–0.07)
Basophils Absolute: 0 10*3/uL (ref 0.0–0.1)
Basophils Relative: 1 %
Eosinophils Absolute: 0 10*3/uL (ref 0.0–0.5)
Eosinophils Relative: 0 %
HCT: 39.1 % (ref 36.0–46.0)
Hemoglobin: 12.5 g/dL (ref 12.0–15.0)
Immature Granulocytes: 7 %
Lymphocytes Relative: 7 %
Lymphs Abs: 0.5 10*3/uL — ABNORMAL LOW (ref 0.7–4.0)
MCH: 28 pg (ref 26.0–34.0)
MCHC: 32 g/dL (ref 30.0–36.0)
MCV: 87.7 fL (ref 80.0–100.0)
Monocytes Absolute: 0.3 10*3/uL (ref 0.1–1.0)
Monocytes Relative: 5 %
Neutro Abs: 5.3 10*3/uL (ref 1.7–7.7)
Neutrophils Relative %: 80 %
Platelets: 42 10*3/uL — ABNORMAL LOW (ref 150–400)
RBC: 4.46 MIL/uL (ref 3.87–5.11)
RDW: 14.8 % (ref 11.5–15.5)
WBC Morphology: INCREASED
WBC: 6.6 10*3/uL (ref 4.0–10.5)
nRBC: 0 % (ref 0.0–0.2)

## 2020-10-09 LAB — URINALYSIS, ROUTINE W REFLEX MICROSCOPIC
Bilirubin Urine: NEGATIVE
Glucose, UA: NEGATIVE mg/dL
Ketones, ur: NEGATIVE mg/dL
Nitrite: POSITIVE — AB
Protein, ur: 100 mg/dL — AB
RBC / HPF: >50 RBC/hpf — ABNORMAL HIGH (ref 0–5)
Specific Gravity, Urine: 1.031 — ABNORMAL HIGH (ref 1.005–1.030)
WBC, UA: >50 WBC/hpf — ABNORMAL HIGH (ref 0–5)
pH: 5 (ref 5.0–8.0)

## 2020-10-09 LAB — LACTIC ACID, PLASMA
Lactic Acid, Venous: 9.6 mmol/L (ref 0.5–1.9)
Lactic Acid, Venous: 7.7 mmol/L (ref 0.5–1.9)

## 2020-10-09 LAB — RESPIRATORY PANEL BY RT PCR (FLU A&B, COVID)
Influenza A by PCR: NEGATIVE
Influenza B by PCR: NEGATIVE
SARS Coronavirus 2 by RT PCR: POSITIVE — AB

## 2020-10-09 LAB — PROTIME-INR
INR: 1.5 — ABNORMAL HIGH (ref 0.8–1.2)
Prothrombin Time: 17.1 seconds — ABNORMAL HIGH (ref 11.4–15.2)

## 2020-10-09 LAB — APTT: aPTT: 37 seconds — ABNORMAL HIGH (ref 24–36)

## 2020-10-09 LAB — TROPONIN I (HIGH SENSITIVITY): Troponin I (High Sensitivity): 429 ng/L (ref <18)

## 2020-10-09 LAB — BRAIN NATRIURETIC PEPTIDE: B Natriuretic Peptide: 1372.1 pg/mL — ABNORMAL HIGH (ref 0.0–100.0)

## 2020-10-09 MED ORDER — IOHEXOL 350 MG/ML SOLN
100.0000 mL | Freq: Once | INTRAVENOUS | Status: AC | PRN
  Administered 2020-10-09: 100 mL via INTRAVENOUS

## 2020-10-09 MED ORDER — SODIUM CHLORIDE 0.9 % IV SOLN: 2.0000 g | INTRAVENOUS | Status: DC

## 2020-10-09 MED ORDER — LACTATED RINGERS IV SOLN: INTRAVENOUS | Status: AC

## 2020-10-09 MED ORDER — LACTATED RINGERS IV BOLUS (SEPSIS): 1000.0000 mL | Freq: Once | INTRAVENOUS | Status: DC

## 2020-10-09 MED ORDER — LACTATED RINGERS IV BOLUS (SEPSIS)
1000.0000 mL | Freq: Once | INTRAVENOUS | Status: AC
  Administered 2020-10-09: 1000 mL via INTRAVENOUS

## 2020-10-09 MED ORDER — SODIUM CHLORIDE 0.9 % IV SOLN
1.0000 g | INTRAVENOUS | Status: DC
  Administered 2020-10-09: 1 g via INTRAVENOUS
  Filled 2020-10-09: qty 10

## 2020-10-09 MED ORDER — FENTANYL CITRATE (PF) 100 MCG/2ML IJ SOLN
50.0000 ug | Freq: Once | INTRAMUSCULAR | Status: AC
  Administered 2020-10-09: 50 ug via INTRAVENOUS
  Filled 2020-10-09: qty 2

## 2020-10-09 MED ORDER — POLYETHYLENE GLYCOL 3350 17 G PO PACK: 17.0000 g | PACK | Freq: Every day | ORAL | Status: DC | PRN

## 2020-10-09 MED ORDER — DOCUSATE SODIUM 100 MG PO CAPS: 100.0000 mg | ORAL_CAPSULE | Freq: Two times a day (BID) | ORAL | Status: DC | PRN

## 2020-10-09 MED ORDER — SODIUM CHLORIDE 0.9 % IV SOLN
1.0000 g | INTRAVENOUS | Status: AC
  Administered 2020-10-09: 1 g via INTRAVENOUS
  Filled 2020-10-09: qty 10

## 2020-10-09 MED ORDER — NOREPINEPHRINE 4 MG/250ML-% IV SOLN
0.0000 ug/min | INTRAVENOUS | Status: DC
  Administered 2020-10-09: 2 ug/min via INTRAVENOUS
  Administered 2020-10-10: 10 ug/min via INTRAVENOUS
  Administered 2020-10-10 – 2020-10-11 (×2): 8 ug/min via INTRAVENOUS
  Filled 2020-10-09 (×5): qty 250

## 2020-10-09 NOTE — Progress Notes (Signed)
Contacted Dr. Warrick Parisian for follow up lactic orders

## 2020-10-09 NOTE — H&P (Signed)
NAME:  Shirley Sullivan, MRN:  [id number], DOB:  06/26/1961, LOS: 0 ADMISSION DATE:  10/09/2020, CONSULTATION DATE:  10/09/2020 REFERRING MD: [place] Robinson, PA, CHIEF COMPLAINT:  Septic shock  Brief History   59 year old female presenting with abdominal/ flank pain, UTI symptoms, N/V/D, and intermittent chest pain since passing a kidney stone on 11/4.  Found to be in septic shock with UTI and obstructing right ureteral stone with mild hydronephrosis with AKI requiring vasopressor support.   History of present illness   59 year old female with DM, asthma, nephrolithiasis, lupus (no flare in last 5 years/ not on home, depression/ anxiety, arthritis, and COVID-19 (positive on 08/15/2020) presenting to ER with complaints of not feeling well since Thursday.  She thinks she passed a kidney stone on the right and since has continued to fell bad with suprapubic abdominal pain, bilateral flank pain, back pain, nausea and vomiting, decreased urinary output, dysuria, some confusion, generalized weakness, diarrhea, complaints of feeling hot, intermittent midsternum chest pain, and mild shortness of breath.   In ER, she was afebrile, tachycardic 120, hypotensive 75/59, and 93% on room air.   Labs noted for normal WBC, platelets 42, lactic 9.6-> 7.7, CO2 15, glucose 110, BUN 38, sCr 3.49, AG 18, albumin 2.3, AST 43, t. Protein 5.4, BNP 1372, troponin hs 429, INR 1.5, PT 17, UA with large Hgb, trace leukocytes, positive nitrates, protein 100, high SG, many bacteria, RBC > 50, > 50 WBC. EKG unchanged from prior.  CXR showed mild pulmonary vascular congestion, and given back and chest pain with concern for dissection, CTA chest/ abd/ pelvis was negative for dissection or aneurysm, but showed an obstructing 6 mm stone in the proximal right ureter with mild right sided hydroureteronephrosis; also noted for hepatosplenomegaly with likely underlying cirrhosis and portal hypertension.  She received a total of 3L of fluids with  persistent hypotension, therefore levophed was started.  Urology and IR were consulted.  At this time, IR is not recommending percutaneous nephrostomy tube due to mild hydronephrosis and body habitus.  PCCM called for admission.   Past Medical History  DM, asthma, nephrolithiasis, lupus, depression/ anxiety, COVID 19 (positive on 08/15/2020), arthritis, previous lap band placement, Non smoker, non drinker  Significant Hospital Events   11/7 admitted   Consults:  Urology IR   Procedures:   Significant Diagnostic Tests:  10/09/2020 CTA chest/ abd/ pelvis >> 1. No evidence for aortic dissection or aneurysm. 2. Mild right-sided hydroureteronephrosis secondary to an obstructing 6 mm stone in the proximal right ureter. 3. Streaky, linear airspace opacities throughout both lung fields favored to represent atelectasis. 4. 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 5. There is a gastric band in place. 6. Rectosigmoid diverticulosis without acute inflammation.  Aortic Atherosclerosis  Micro Data:  11/7 BCx2 >>  11/7 UC >> 11/7 SARS 2 >> positive   Flu >> neg  Antimicrobials:  11/7 ceftriaxone >>  Interim history/subjective:  Currently on 8 mcg of NE  Objective   Blood pressure (!) 87/69, pulse (!) 121, temperature 97.8 F (36.6 C), temperature source Oral, resp. rate (!) 32, height 5\' 4"  (1.626 m), weight 88.5 kg, SpO2 93 %.       No intake or output data in the 24 hours ending 10/09/20 2226 Filed Weights   10/09/20 1733  Weight: 88.5 kg   Examination: General:  Ill appearing adult female lying in bed in mild distress/ uncomfortable HEENT: MM pink/dry Neuro: alert, oriented, MAE, non focal  CV: rr, ST, no murmur PULM:  Tachypneic, clear anteriorly, diminished in bases, no wheeze, on 2L [place] GI: obese, soft, +bs  Extremities: cool/dry, no LE edema  Skin: no rashes   Resolved Hospital Problem list    Assessment & Plan:   Septic shock  secondary to UTI, can not rule out cardiogenic component as well based on complaints of chest pain, elevated troponin and BNP P:  Admit to ICU Peripheral NE for MAP goal > 65 May need central line if increasing pressor amounts  Continue ceftriaxone  Follow cultures  See below for nephrolithiasis  Trend lactic acid Trend troponin hs, TTE in am   Obstructing nephrolithiasis, 6 mm in proximal right ureter, with mild hydronephrosis P:  - per Urology.  IR consulted for percutaneous nephrostomy tube, recommends medical treatment for now.  Given hydronephrosis is mild and patients body habitus, nephrostomy tube would be difficult.   - foley placement to decompress bladder, monitor for obstruction given urinary sediment  AKI, most likely post-obstruction related to obstructing nephrolithiasis, may be component of pre-renal given N/V/D AGMA/ lactic acidosis  - s/p 3L IVF P:  See above Foley as above Strict I/O's  Trend renal indices   Elevated INR  Thrombocytopenia - possibly related to sepsis vs underlying liver disease/ hepatic congestion P:  Trend CBC/ coags Monitor for bleeding- currently no signs, Hgb wnl   Elevated troponin hs/ BNP with intermittent midsternal chest pain R/O demand ischemia vs ACS P:  Tele monitoring Trend troponin hs/ TTE as above  Hepatosplenomegaly with possibly underlying cirrhosis and portal hypertension P:  Trend LFTs/ coags Will need further GI outpatient workup  Mild hypoxemia - atelectasis noted on CTA P:  Aggressive pulmonary hygiene Supplemental O2 prn   COVID positive- initially positive on 9/13 P:  Out of 21 day window, likely still viral shedding  DM P:  Trend CBG q 4, add SSI if need for goal <180   Best practice:  Diet: NPO Pain/Anxiety/Delirium protocol (if indicated): n/a VAP protocol (if indicated): n/a DVT prophylaxis: SCDs given thrombocytopenia  GI prophylaxis: n/a Glucose control: CBG q 4 Mobility: BR Code Status:  full  Family Communication: patient and husband updated at bedside on plan of care Disposition: admit to ICU   Labs   CBC: Recent Labs  Lab 10/09/20 1750  WBC 6.6  NEUTROABS 5.3  HGB 12.5  HCT 39.1  MCV 87.7  PLT 42*    Basic Metabolic Panel: Recent Labs  Lab 10/09/20 1958  NA 136  K 4.0  CL 103  CO2 15*  GLUCOSE 110*  BUN 38*  CREATININE 3.49*  CALCIUM 8.0*   GFR: Estimated Creatinine Clearance: 18.2 mL/min (A) (by C-G formula based on SCr of 3.49 mg/dL (H)). Recent Labs  Lab 10/09/20 1750 10/09/20 1958  WBC 6.6  --   LATICACIDVEN 9.6* 7.7*    Liver Function Tests: Recent Labs  Lab 10/09/20 1958  AST 43*  ALT 30  ALKPHOS 51  BILITOT 0.8  PROT 5.4*  ALBUMIN 2.3*   No results for input(s): LIPASE, AMYLASE in the last 168 hours. No results for input(s): AMMONIA in the last 168 hours.  ABG No results found for: PHART, PCO2ART, PO2ART, HCO3, TCO2, ACIDBASEDEF, O2SAT   Coagulation Profile: Recent Labs  Lab 10/09/20 1750  INR 1.5*    Cardiac Enzymes: No results for input(s): CKTOTAL, CKMB, CKMBINDEX, TROPONINI in the last 168 hours.  HbA1C: No results found for: HGBA1C  CBG: No results for input(s): GLUCAP  in the last 168 hours.  Review of Systems:   Review of Systems  Constitutional: Positive for malaise/fatigue. Negative for fever.  Respiratory: Positive for shortness of breath. Negative for cough, sputum production and wheezing.   Cardiovascular: Positive for chest pain. Negative for palpitations and leg swelling.  Gastrointestinal: Positive for abdominal pain, diarrhea, nausea and vomiting.  Genitourinary: Positive for dysuria and flank pain.  Musculoskeletal: Positive for back pain.  Skin: Negative for rash.  Neurological: Positive for weakness. Negative for focal weakness.   Past Medical History  She,  has a past medical history of Allergy, Anxiety, Arthritis, Asthma, COVID-19, Depression, Diabetes mellitus without complication  (HCC), History of kidney stones (1998, 2015), and Lupus (HCC).   Surgical History    Past Surgical History:  Procedure Laterality Date  . ABDOMINAL HYSTERECTOMY  2004  . APPENDECTOMY  1992  . CYSTOSCOPY WITH RETROGRADE PYELOGRAM, URETEROSCOPY AND STENT PLACEMENT Left 08/11/2014   Procedure: CYSTOSCOPY WITH RETROGRADE PYELOGRAM, URETEROSCOPY AND STENT PLACEMENT,  DIGITAL FLEXIBLE URETEROSCOPE;  Surgeon: Heloise Purpura, MD;  Location: [facility];  Service: Urology;  Laterality: Left;  request digital flexible ureteroscope  . CYSTOSCOPY WITH RETROGRADE PYELOGRAM, URETEROSCOPY AND STENT PLACEMENT Left 08/23/2014   Procedure: CYSTOSCOPY WITH RETROGRADE PYELOGRAM, URETEROSCOPY AND STENT EXCHANGE;  Surgeon: Heloise Purpura, MD;  Location: [facility];  Service: Urology;  Laterality: Left;  . HOLMIUM LASER APPLICATION Left 08/23/2014   Procedure: HOLMIUM LASER APPLICATION;  Surgeon: Heloise Purpura, MD;  Location: [facility];  Service: Urology;  Laterality: Left;  . ROTATOR CUFF REPAIR Right 2007     Social History   reports that she has never smoked. She has never used smokeless tobacco. She reports that she does not drink alcohol and does not use drugs.   Family History   Her family history includes Hyperlipidemia in her mother.   Allergies Allergies  Allergen Reactions  . Codeine Itching  . Strawberry Extract Hives, Itching and Other (See Comments)    Can't breathe  . Vibramycin [Doxycycline Calcium] Nausea And Vomiting     Home Medications  Prior to Admission medications   Medication Sig Start Date End Date Taking? Authorizing Provider  albuterol (PROVENTIL HFA;VENTOLIN HFA) 108 (90 BASE) MCG/ACT inhaler Inhale 1 puff into the lungs every 4 (four) hours as needed for wheezing or shortness of breath. 01/20/15  Yes Andrena Mews, DO  ALPRAZolam Prudy Feeler) 0.5 MG tablet Take 0.5 mg by mouth 2 (two) times daily as needed for anxiety.    Yes [provider]  aspirin EC 81 MG tablet Take 81 mg by mouth  daily as needed (chest pain).    Yes [provider]  cetirizine (ZYRTEC) 10 MG tablet Take 10 mg by mouth at bedtime.    Yes [provider]  gabapentin (NEURONTIN) 300 MG capsule Take 300 mg by mouth 2 (two) times daily.  09/23/20  Yes [provider]  ibuprofen (ADVIL,MOTRIN) 200 MG tablet Take 400 mg by mouth every 6 (six) hours as needed for moderate pain (right knee pain).    Yes [provider]  sertraline (ZOLOFT) 100 MG tablet Take 150 mg by mouth at bedtime.    Yes [provider]  benzonatate (TESSALON) 100 MG capsule Take 1 capsule (100 mg total) by mouth 2 (two) times daily as needed for cough. Patient not taking: Reported on 02/19/2016 01/20/15   Andrena Mews, DO  HYDROcodone-acetaminophen (NORCO/VICODIN) 5-325 MG per tablet Take 1-2 tablets by mouth every 4 (four) hours as needed.  Patient not taking: Reported on 02/19/2016 01/24/15   Linwood Dibbles, MD  ondansetron (ZOFRAN ODT) 4 MG disintegrating tablet 4mg  ODT q4 hours prn nausea/vomit Patient not taking: Reported on 03/10/2017 02/19/16   02/21/16, MD  oxyCODONE-acetaminophen (PERCOCET) 5-325 MG tablet Take 2 tablets by mouth every 4 (four) hours as needed. Patient not taking: Reported on 03/10/2017 02/19/16   02/21/16, MD  pantoprazole (PROTONIX) 20 MG tablet Take 1 tablet (20 mg total) by mouth daily. Patient not taking: Reported on 02/19/2016 01/24/15   01/26/15, MD  tamsulosin [facility]) 0.4 MG CAPS capsule Take 1 capsule (0.4 mg total) by mouth daily after breakfast. Patient not taking: Reported on 03/10/2017 02/19/16   02/21/16, MD     Critical care time:  60 mins     Bethann Berkshire, ACNP [facility] 10/09/2020, 11:34 PM  See 13/06/2020 for personal pager PCCM on call pager [phone]

## 2020-10-09 NOTE — Progress Notes (Signed)
eLink is monitoring this sepsis. Thank you 

## 2020-10-09 NOTE — Progress Notes (Signed)
Communicated with [place] Robinson PA-C regarding fluid resuscitation, PA was able to tell me pt received 1 liter with EMS then received 2 liters in ED. Continue to follow regarding sepsis protocol

## 2020-10-09 NOTE — Progress Notes (Signed)
eLink Physician-Brief Progress Note Patient Name: Shirley Sullivan DOB: [birth year] MRN: [id number]   Date of Service  10/09/2020  HPI/Events of Note  Patient admitted with septic shock of urinary tract origin, she needs follow up lactic acid orders.  eICU Interventions  Lactic acid ordered per sepsis protocol.        Thomasene Lot Key Cen 10/09/2020, 11:13 PM

## 2020-10-09 NOTE — ED Notes (Signed)
Date and time results received: 10/09/20 2100 (use smartphrase ".now" to insert current time)  Test: Troponin  Critical Value: 429  Name of Provider Notified: [place] Robinson   Orders Received? Or Actions Taken?:

## 2020-10-09 NOTE — ED Triage Notes (Signed)
Pt here via [facility] from home.  S/s started as UTI on Thurs that developed into diarrhea, vomiting and near-syncope.    80/40 hhr 120 rr 36 cbg 146 ETCO2 21\ ra 88% (91 ON 2l)   1L NS infusing  Pt ao x 4

## 2020-10-09 NOTE — ED Provider Notes (Signed)
[facility] EMERGENCY DEPARTMENT Provider Note   CSN: [id number] Arrival date & time: 10/09/20  1726     History Chief Complaint  Patient presents with  . Diarrhea  . Near Syncope    Shirley Sullivan is a 59 y.o. female. Past medical history of diabetes, lupus, nephrolithiasis, COVID-19, presenting to the emergency department via EMS from home. Patient states on Apr 08, 2023 she thought she had passed a kidney stone on the right. She treated this at home as she has had stones in the past similarly. She felt well for the following 24 hours, however developed vomiting, diarrhea, and UTI symptoms. She endorses associated suprapubic abdominal pain and back pain from the top of her shoulders all the way through her low back. She is not having any chest pain or shortness of breath. She had no fevers or chills at home. Reports history of frequent UTIs, last UTI was over the summer, treated with Cipro.   The history is provided by the patient.       Past Medical History:  Diagnosis Date  . Allergy   . Anxiety   . Arthritis   . Asthma    perfumes triggers attacks. Has inhalers for rescue  . COVID-19   . Depression   . Diabetes mellitus without complication (HCC)   . History of kidney stones 1998, 2015  . Lupus (HCC)     There are no problems to display for this patient.   Past Surgical History:  Procedure Laterality Date  . ABDOMINAL HYSTERECTOMY  2004  . APPENDECTOMY  1992  . CYSTOSCOPY WITH RETROGRADE PYELOGRAM, URETEROSCOPY AND STENT PLACEMENT Left 08/11/2014   Procedure: CYSTOSCOPY WITH RETROGRADE PYELOGRAM, URETEROSCOPY AND STENT PLACEMENT,  DIGITAL FLEXIBLE URETEROSCOPE;  Surgeon: Heloise Purpura, MD;  Location: [facility];  Service: Urology;  Laterality: Left;  request digital flexible ureteroscope  . CYSTOSCOPY WITH RETROGRADE PYELOGRAM, URETEROSCOPY AND STENT PLACEMENT Left 08/23/2014   Procedure: CYSTOSCOPY WITH RETROGRADE PYELOGRAM, URETEROSCOPY AND STENT EXCHANGE;   Surgeon: Heloise Purpura, MD;  Location: [facility];  Service: Urology;  Laterality: Left;  . HOLMIUM LASER APPLICATION Left 08/23/2014   Procedure: HOLMIUM LASER APPLICATION;  Surgeon: Heloise Purpura, MD;  Location: [facility];  Service: Urology;  Laterality: Left;  . ROTATOR CUFF REPAIR Right 2007     OB History   No obstetric history on file.     Family History  Problem Relation Age of Onset  . Hyperlipidemia Mother     Social History   Tobacco Use  . Smoking status: Never Smoker  . Smokeless tobacco: Never Used  Substance Use Topics  . Alcohol use: No    Alcohol/week: 0.0 standard drinks  . Drug use: No    Home Medications Prior to Admission medications   Medication Sig Start Date End Date Taking? Authorizing Provider  albuterol (PROVENTIL HFA;VENTOLIN HFA) 108 (90 BASE) MCG/ACT inhaler Inhale 1 puff into the lungs every 4 (four) hours as needed for wheezing or shortness of breath. Patient not taking: Reported on 02/19/2016 01/20/15   Andrena Mews, DO  ALPRAZolam Prudy Feeler) 0.5 MG tablet Take 0.5 mg by mouth at bedtime.     [provider]  aspirin EC 81 MG tablet Take 81 mg by mouth daily.    [provider]  benzonatate (TESSALON) 100 MG capsule Take 1 capsule (100 mg total) by mouth 2 (two) times daily as needed for cough. Patient not taking: Reported on 02/19/2016 01/20/15   Andrena Mews, DO  cetirizine (  ZYRTEC) 10 MG tablet Take 10 mg by mouth at bedtime.     [provider]  HYDROcodone-acetaminophen (NORCO/VICODIN) 5-325 MG per tablet Take 1-2 tablets by mouth every 4 (four) hours as needed. Patient not taking: Reported on 02/19/2016 01/24/15   Linwood DibblesKnapp, Jon, MD  ibuprofen (ADVIL,MOTRIN) 200 MG tablet Take 400 mg by mouth every 6 (six) hours as needed for moderate pain (right knee pain).     [provider]  omeprazole (PRILOSEC) 20 MG capsule Take 1 capsule (20 mg total) by mouth daily. 03/10/17 04/09/17  Mathews RobinsonsMitchell, Jessica B, PA-C  ondansetron  (ZOFRAN ODT) 4 MG disintegrating tablet 4mg  ODT q4 hours prn nausea/vomit Patient not taking: Reported on 03/10/2017 02/19/16   Bethann BerkshireZammit, Joseph, MD  oxyCODONE-acetaminophen (PERCOCET) 5-325 MG tablet Take 2 tablets by mouth every 4 (four) hours as needed. Patient not taking: Reported on 03/10/2017 02/19/16   Bethann BerkshireZammit, Joseph, MD  pantoprazole (PROTONIX) 20 MG tablet Take 1 tablet (20 mg total) by mouth daily. Patient not taking: Reported on 02/19/2016 01/24/15   Linwood DibblesKnapp, Jon, MD  sertraline (ZOLOFT) 100 MG tablet Take 150 mg by mouth at bedtime.     [provider]  tamsulosin (FLOMAX) 0.4 MG CAPS capsule Take 1 capsule (0.4 mg total) by mouth daily after breakfast. Patient not taking: Reported on 03/10/2017 02/19/16   Bethann BerkshireZammit, Joseph, MD    Allergies    Codeine, Strawberry extract, and Vibramycin [doxycycline calcium]  Review of Systems   Review of Systems  Constitutional: Negative for chills and fever.  Respiratory: Negative for cough.   Gastrointestinal: Positive for abdominal pain, diarrhea and vomiting.  Genitourinary: Positive for dysuria, flank pain and frequency.  Musculoskeletal: Positive for back pain.  Neurological: Positive for light-headedness.  All other systems reviewed and are negative.   Physical Exam Updated Vital Signs BP (!) 80/32 (BP Location: Right Arm)   Pulse (!) 121   Temp 98.9 F (37.2 C) (Rectal)   Resp 14   Ht 5\' 4"  (1.626 m)   Wt 88.5 kg   SpO2 93%   BMI 33.47 kg/m   Physical Exam Vitals and nursing note reviewed.  Constitutional:      Appearance: She is well-developed. She is ill-appearing.  HENT:     Head: Normocephalic and atraumatic.     Mouth/Throat:     Mouth: Mucous membranes are dry.  Eyes:     Conjunctiva/sclera: Conjunctivae normal.  Cardiovascular:     Rate and Rhythm: Regular rhythm. Tachycardia present.  Pulmonary:     Effort: Pulmonary effort is normal.     Comments: Tachypnea, lungs CTAB. O2 sat 87% on 2L improves to 93% with  deep breaths. Abdominal:     General: Bowel sounds are normal.     Palpations: Abdomen is soft.     Tenderness: There is no abdominal tenderness. There is no guarding or rebound.  Musculoskeletal:     Right lower leg: No edema.     Left lower leg: No edema.  Skin:    Coloration: Skin is pale.     Comments: Distal extremities are cool   Neurological:     Mental Status: She is alert and oriented to person, place, and time.  Psychiatric:        Behavior: Behavior normal.     ED Results / Procedures / Treatments   Labs (all labs ordered are listed, but only abnormal results are displayed) Labs Reviewed  CULTURE, BLOOD (ROUTINE X 2)  CULTURE, BLOOD (ROUTINE X 2)  URINE CULTURE  LACTIC ACID, PLASMA  LACTIC ACID, PLASMA  COMPREHENSIVE METABOLIC PANEL  CBC WITH DIFFERENTIAL/PLATELET  PROTIME-INR  APTT  URINALYSIS, ROUTINE W REFLEX MICROSCOPIC    EKG None  Radiology No results found.  Procedures .Critical Care Performed by: Pete Merten, [place], PA-C Authorized by: Kaysie Michelini, [place] N, PA-C   Critical care provider statement:    Critical care time (minutes):  90   Critical care time was exclusive of:  Separately billable procedures and treating other patients and teaching time   Critical care was necessary to treat or prevent imminent or life-threatening deterioration of the following conditions:  Shock, circulatory failure, sepsis and cardiac failure   Critical care was time spent personally by me on the following activities:  Discussions with consultants, evaluation of patient's response to treatment, examination of patient, ordering and performing treatments and interventions, ordering and review of laboratory studies, ordering and review of radiographic studies, pulse oximetry, re-evaluation of patient's condition, obtaining history from patient or surrogate and review of old charts   I assumed direction of critical care for this patient from another provider in my  specialty: no     (including critical care time)  Medications Ordered in ED Medications  lactated ringers infusion (has no administration in time range)  lactated ringers bolus 1,000 mL (1,000 mLs Intravenous New Bag/Given 10/09/20 1752)    And  lactated ringers bolus 1,000 mL (has no administration in time range)  cefTRIAXone (ROCEPHIN) 1 g in sodium chloride 0.9 % 100 mL IVPB (has no administration in time range)    ED Course  I have reviewed the triage vital signs and the nursing notes.  Pertinent labs & imaging results that were available during my care of the patient were reviewed by me and considered in my medical decision making (see chart for details).  Clinical Course as of Oct 09 2238  Wynelle Link Oct 09, 2020  1745 Pt presenting with near syncope, diarrhea, nausea, suprapubic pain after passing kidney stone at home on Thursday.  She states she has history of recurrent nephrolithiasis, was treating her symptoms at home and passed a stone on Thursday.  She reports right-sided stone.  She states 24 hours after that she was feeling well until she began having suprapubic abdominal pain with UTI symptoms, nausea and diarrhea.  A few episodes of diarrhea per day.  No fevers at home.  She does endorse bilateral flank pain.  Also ports history of current UTI, last UTI was in July treated with Cipro.  She is ill-appearing with tachycardia, hypotension, hypoxia requiring oxygen supplementation.  No respiratory symptoms of cough or shortness of breath.  Code sepsis initiated for suspected urinary source.  Consider pyelonephritis versus infected stone versus UTI.   [JR]  1815 VS without improvement after 1.5L. will continue to monitor closely. Pt remains at baseline mental status   [JR]  1843 Pressures not responding to IVF, completed 2 liters. Considering pressors. CXR on my read with questionable widened mediastinum. Patient endorses upper back pain as well as flank pain. Considering other  noninfectious cause of presentation, including dissection or other cardiovascular cause? Dr. Adela Lank to evaluate for recommendations and care guidance   [JR]  1901 Labs hemolyzed. Chem 8 ordered for renal function. Levophed ordered for BP support. CXR with pulm vasc congestion. Bedside echo performed by Dr. Adela Lank. Poor squeeze of ventricles, mildly dilated RA and IVC. Concern for cardiomyopathy, possibly sepsis cause. Will also add on BNP and trop   [JR]  1903  CT imaging changed to dissection study.  Decision made to scan prior to kidney function with Dr. Adela Lank due to patient's critical status.   [JR]  2023 Platelets(!): 42 [JR]  2023 WBC: 6.6 [JR]  2023 No dissection.  6mm obstructing right prox ureteral stone. Suspect infected stone.  CT Angio Chest/Abd/Pel for Dissection W and/or W/WO [JR]  2119 Discussed with urology resident on-call Dr. Patience Musca.  Recommends nephrostomy tube by IR for urgent need of decompression regardless of UA results.  Urology to follow.  Appreciate consult.   [JR]  2136 Discussed with Dr. Everardo All with critical care. Will admit and evaluate patient. Consult placed to IR for nephrostomy placement.   [JR]  2154 Consulted with Dr. Denny Levy with interventional radiology.  Recommends given mild degree of hydronephrosis, this will be difficult placement of nephrostomy tube.  Does not recommend emergent IR interventions at this time.   [JR]  2209 Urology in agreement with delaying nephrostomy placement at this time.  Continue to medically manage with critical care admitting.  Appreciate consultations   [JR]  2238 Likely residual positive from recent illness in September.  Patient reports symptom resolution after 25 days of illness, initially tested + September 13.  Was unaware of how recent her illness was when Covid swab was ordered.  SARS Coronavirus 2 by RT PCR(!): POSITIVE [JR]    Clinical Course User Index [JR] Avanell Banwart, [place] N, PA-C   MDM Rules/Calculators/A&P                           Patient with history of recurrent nephrolithiasis and UTI, presenting to the ED with near syncope, vomiting, diarrhea, lower abdominal pain and UTI symptoms.  She states on Thursday she thought she passed a kidney stone from the right and felt well for 24 hours, however began feeling very ill.  She is having symptoms of UTI with frequency and urgency, back pain that extends from her neck all the way through her lower back though does endorse flank pain.  She denies fevers or chills at home.  Patient arrives via EMS, is very ill-appearing.  She is tachycardic in the 120s, hypotensive as low as 60/30. She is hypoxic per EMS and on 2L [place] on evaluation - satting at 87%. She is also given 1L NS by EMS. She is mentating appropriately, able to give full history. Code sepsis initiated, complete 30cc/kg fluid resuscitation. IV rocephin with suspected urinary source.    Patient required levophed for BP support due to failed improvement with IVF. She began complaining of chest pain as well, Dr. Adela Lank evaluated. Considering diffuse back pain with chest pain, consideration for alternative etiology of presentation aside from infection. Decision to CT dissection study of chest/abd/pelvis which was neg for dissection and reveals 6mm obstructing prox right ureteral stone  w mild hydro.   Bedside ECHO performed by Dr. Adela Lank was concerning for signs of heart failure, add on BNP and troponin.    Levophed providing more stable BP. Trop is elev at 430, BNP also elev 1300. ARF with Cr 3.5, BUN 38. Lactic acidosis of 9.6. Will hold on additional fluids, continue levophed.  In and out cath for U/A is consistent with infection, urine culture sent.   Consultations with urology and interventional radiology - decision to continue medical management at this time given mild degree of hydronephrosis.  Patient noted to have positive COVID swab, however had rather recent infection within 3months -- tested positive  Sept 13 and reports illness duration of 25 days. Suspect this may be residual positive from recent infection, seems less likely active infection considering recent illness and presentation.   Patient with septic shock due to infected stone, with suspected resulting cardiomyopathy vs heart failure, acute renal failure. Patient admitted to critical care for further management.    Final Clinical Impression(s) / ED Diagnoses Final diagnoses:  Septic shock (HCC)  Acute renal failure, unspecified acute renal failure type (HCC)  Ureteral stone with hydronephrosis    Rx / DC Orders ED Discharge Orders    None       Riaan Toledo, [place] N, PA-C 10/09/20 2352    Melene Plan, DO 10/10/20 1501

## 2020-10-09 NOTE — Consult Note (Addendum)
Urology Consult   Physician requesting consult: Mechele Collin MD  Reason for consult: urosepsis and ureteral stone  History of Present Illness: Shirley Sullivan is a 59 y.o. with history of nephrolithiasis requiring intervention with cysto/USE/stent in the past who presents to ED with a few days of suprapubic/abdominal pain and bilateral flank pain, frequency, urgency and dysuria concerning for UTI. This has also been associated with nausea, diarrhea, near syncopal episode. Symptoms started about a day after successfully passing a stone on 11/4. She denies associated fevers, chills. Was treated for UTI a few months ago. Upon arrival to ED, she was started on IVF and empiric ceftriaxone, CT with right 6mm proximal obstructing ureteral stone with mild hydronephrosis. She is currently tachycardic to 130, hypotensive requiring pressor support despite 3L fluid resuscitation, lactate of 9.6, Cr elevated to 3.5 from baseline 0.6. Elevated troponin to 429 and echo with concern for cardiomyopathy. No leukocytosis. UA concerning for active infection with bacteria and nitrites. She is not on blood thinning medication.    Past Medical History:  Diagnosis Date  . Allergy   . Anxiety   . Arthritis   . Asthma    perfumes triggers attacks. Has inhalers for rescue  . COVID-19   . Depression   . Diabetes mellitus without complication (HCC)   . History of kidney stones 1998, 2015  . Lupus [facility])     Past Surgical History:  Procedure Laterality Date  . ABDOMINAL HYSTERECTOMY  2004  . APPENDECTOMY  1992  . CYSTOSCOPY WITH RETROGRADE PYELOGRAM, URETEROSCOPY AND STENT PLACEMENT Left 08/11/2014   Procedure: CYSTOSCOPY WITH RETROGRADE PYELOGRAM, URETEROSCOPY AND STENT PLACEMENT,  DIGITAL FLEXIBLE URETEROSCOPE;  Surgeon: Heloise Purpura, MD;  Location: [facility];  Service: Urology;  Laterality: Left;  request digital flexible ureteroscope  . CYSTOSCOPY WITH RETROGRADE PYELOGRAM, URETEROSCOPY AND STENT PLACEMENT Left  08/23/2014   Procedure: CYSTOSCOPY WITH RETROGRADE PYELOGRAM, URETEROSCOPY AND STENT EXCHANGE;  Surgeon: Heloise Purpura, MD;  Location: [facility];  Service: Urology;  Laterality: Left;  . HOLMIUM LASER APPLICATION Left 08/23/2014   Procedure: HOLMIUM LASER APPLICATION;  Surgeon: Heloise Purpura, MD;  Location: [facility];  Service: Urology;  Laterality: Left;  . ROTATOR CUFF REPAIR Right 2007     Current Hospital Medications:  Home meds:  No current facility-administered medications on file prior to encounter.   Current Outpatient Medications on File Prior to Encounter  Medication Sig Dispense Refill  . albuterol (PROVENTIL HFA;VENTOLIN HFA) 108 (90 BASE) MCG/ACT inhaler Inhale 1 puff into the lungs every 4 (four) hours as needed for wheezing or shortness of breath. 1 Inhaler 0  . ALPRAZolam (XANAX) 0.5 MG tablet Take 0.5 mg by mouth 2 (two) times daily as needed for anxiety.     Marland Kitchen aspirin EC 81 MG tablet Take 81 mg by mouth daily as needed (chest pain).     . cetirizine (ZYRTEC) 10 MG tablet Take 10 mg by mouth at bedtime.     . gabapentin (NEURONTIN) 300 MG capsule Take 300 mg by mouth 2 (two) times daily.     Marland Kitchen ibuprofen (ADVIL,MOTRIN) 200 MG tablet Take 400 mg by mouth every 6 (six) hours as needed for moderate pain (right knee pain).     Marland Kitchen sertraline (ZOLOFT) 100 MG tablet Take 150 mg by mouth at bedtime.     . benzonatate (TESSALON) 100 MG capsule Take 1 capsule (100 mg total) by mouth 2 (two) times daily as needed for cough. (Patient not taking: Reported on 02/19/2016) 20 capsule  0  . HYDROcodone-acetaminophen (NORCO/VICODIN) 5-325 MG per tablet Take 1-2 tablets by mouth every 4 (four) hours as needed. (Patient not taking: Reported on 02/19/2016) 20 tablet 0  . ondansetron (ZOFRAN ODT) 4 MG disintegrating tablet 4mg  ODT q4 hours prn nausea/vomit (Patient not taking: Reported on 03/10/2017) 4 tablet 0  . oxyCODONE-acetaminophen (PERCOCET) 5-325 MG tablet Take 2 tablets by mouth every 4 (four) hours as  needed. (Patient not taking: Reported on 03/10/2017) 20 tablet 0  . pantoprazole (PROTONIX) 20 MG tablet Take 1 tablet (20 mg total) by mouth daily. (Patient not taking: Reported on 02/19/2016) 14 tablet 0  . tamsulosin (FLOMAX) 0.4 MG CAPS capsule Take 1 capsule (0.4 mg total) by mouth daily after breakfast. (Patient not taking: Reported on 03/10/2017) 5 capsule 0     Scheduled Meds: Continuous Infusions: . cefTRIAXone (ROCEPHIN)  IV    . [START ON 10/10/2020] cefTRIAXone (ROCEPHIN)  IV    . lactated ringers    . norepinephrine (LEVOPHED) Adult infusion 8 mcg/min (10/09/20 2214)   PRN Meds:.docusate sodium, polyethylene glycol  Allergies:  Allergies  Allergen Reactions  . Codeine Itching  . Strawberry Extract Hives, Itching and Other (See Comments)    Can't breathe  . Vibramycin [Doxycycline Calcium] Nausea And Vomiting    Family History  Problem Relation Age of Onset  . Hyperlipidemia Mother     Social History:  reports that she has never smoked. She has never used smokeless tobacco. She reports that she does not drink alcohol and does not use drugs.  ROS: A complete review of systems was performed.  All systems are negative except for pertinent findings as noted.  Physical Exam:  Vital signs in last 24 hours: Temp:  [97.8 F (36.6 C)-98.9 F (37.2 C)] 97.8 F (36.6 C) (11/07 2133) Pulse Rate:  [119-128] 121 (11/07 2210) Resp:  [14-33] 32 (11/07 2210) BP: (75-102)/(32-70) 87/69 (11/07 2210) SpO2:  [92 %-97 %] 93 % (11/07 2210) Weight:  [88.5 kg] 88.5 kg (11/07 1733) Constitutional:  Alert and oriented Cardiovascular: tachycardic Respiratory: slightly increased work of breathing GI: Abdomen is soft, nontender, nondistended, no abdominal masses GU: mild bilateral CVA tenderness Neurologic: Grossly intact, no focal deficits  Laboratory Data:  Recent Labs    10/09/20 1750  WBC 6.6  HGB 12.5  HCT 39.1  PLT 42*    Recent Labs    10/09/20 1958  NA 136  K 4.0  CL  103  GLUCOSE 110*  BUN 38*  CALCIUM 8.0*  CREATININE 3.49*     Results for orders placed or performed during the hospital encounter of 10/09/20 (from the past 24 hour(s))  Lactic acid, plasma     Status: Abnormal   Collection Time: 10/09/20  5:50 PM  Result Value Ref Range   Lactic Acid, Venous 9.6 (HH) 0.5 - 1.9 mmol/L  CBC WITH DIFFERENTIAL     Status: Abnormal   Collection Time: 10/09/20  5:50 PM  Result Value Ref Range   WBC 6.6 4.0 - 10.5 K/uL   RBC 4.46 3.87 - 5.11 MIL/uL   Hemoglobin 12.5 12.0 - 15.0 g/dL   HCT 13/07/21 36 - 46 %   MCV 87.7 80.0 - 100.0 fL   MCH 28.0 26.0 - 34.0 pg   MCHC 32.0 30.0 - 36.0 g/dL   RDW 18.8 41.6 - 60.6 %   Platelets 42 (L) 150 - 400 K/uL   nRBC 0.0 0.0 - 0.2 %   Neutrophils Relative % 80 %  Neutro Abs 5.3 1.7 - 7.7 K/uL   Lymphocytes Relative 7 %   Lymphs Abs 0.5 (L) 0.7 - 4.0 K/uL   Monocytes Relative 5 %   Monocytes Absolute 0.3 0.1 - 1.0 K/uL   Eosinophils Relative 0 %   Eosinophils Absolute 0.0 0.0 - 0.5 K/uL   Basophils Relative 1 %   Basophils Absolute 0.0 0.0 - 0.1 K/uL   WBC Morphology INCREASED BANDS (>20% BANDS)    Immature Granulocytes 7 %   Abs Immature Granulocytes 0.46 (H) 0.00 - 0.07 K/uL  Protime-INR     Status: Abnormal   Collection Time: 10/09/20  5:50 PM  Result Value Ref Range   Prothrombin Time 17.1 (H) 11.4 - 15.2 seconds   INR 1.5 (H) 0.8 - 1.2  APTT     Status: Abnormal   Collection Time: 10/09/20  5:50 PM  Result Value Ref Range   aPTT 37 (H) 24 - 36 seconds  Lactic acid, plasma     Status: Abnormal   Collection Time: 10/09/20  7:58 PM  Result Value Ref Range   Lactic Acid, Venous 7.7 (HH) 0.5 - 1.9 mmol/L  Comprehensive metabolic panel     Status: Abnormal   Collection Time: 10/09/20  7:58 PM  Result Value Ref Range   Sodium 136 135 - 145 mmol/L   Potassium 4.0 3.5 - 5.1 mmol/L   Chloride 103 98 - 111 mmol/L   CO2 15 (L) 22 - 32 mmol/L   Glucose, Bld 110 (H) 70 - 99 mg/dL   BUN 38 (H) 8 - 23 mg/dL    Creatinine, Ser 2.50 (H) 0.44 - 1.00 mg/dL   Calcium 8.0 (L) 8.9 - 10.3 mg/dL   Total Protein 5.4 (L) 6.5 - 8.1 g/dL   Albumin 2.3 (L) 3.5 - 5.0 g/dL   AST 43 (H) 15 - 41 U/L   ALT 30 0 - 44 U/L   Alkaline Phosphatase 51 38 - 126 U/L   Total Bilirubin 0.8 0.3 - 1.2 mg/dL   GFR, Estimated 14 (L) >60 mL/min   Anion gap 18 (H) 5 - 15  Troponin I (High Sensitivity)     Status: Abnormal   Collection Time: 10/09/20  7:58 PM  Result Value Ref Range   Troponin I (High Sensitivity) 429 (HH) <18 ng/L  Brain natriuretic peptide     Status: Abnormal   Collection Time: 10/09/20  7:58 PM  Result Value Ref Range   B Natriuretic Peptide 1,372.1 (H) 0.0 - 100.0 pg/mL  Urinalysis, Routine w reflex microscopic Urine, Catheterized     Status: Abnormal   Collection Time: 10/09/20  8:50 PM  Result Value Ref Range   Color, Urine AMBER (A) YELLOW   APPearance CLOUDY (A) CLEAR   Specific Gravity, Urine 1.031 (H) 1.005 - 1.030   pH 5.0 5.0 - 8.0   Glucose, UA NEGATIVE NEGATIVE mg/dL   Hgb urine dipstick LARGE (A) NEGATIVE   Bilirubin Urine NEGATIVE NEGATIVE   Ketones, ur NEGATIVE NEGATIVE mg/dL   Protein, ur 539 (A) NEGATIVE mg/dL   Nitrite POSITIVE (A) NEGATIVE   Leukocytes,Ua TRACE (A) NEGATIVE   RBC / HPF >50 (H) 0 - 5 RBC/hpf   WBC, UA >50 (H) 0 - 5 WBC/hpf   Bacteria, UA MANY (A) NONE SEEN   WBC Clumps PRESENT    Mucus PRESENT    Non Squamous Epithelial 6-10 (A) NONE SEEN   No results found for this or any previous visit (from the past 240  hour(s)).  Renal Function: Recent Labs    10/09/20 1958  CREATININE 3.49*   Estimated Creatinine Clearance: 18.2 mL/min (A) (by C-G formula based on SCr of 3.49 mg/dL (H)).  Radiologic Imaging: DG Chest Port 1 View  Result Date: 10/09/2020 CLINICAL DATA:  Questionable sepsis EXAM: PORTABLE CHEST 1 VIEW COMPARISON:  March 10, 2017 FINDINGS: The heart size and mediastinal contours are within normal limits. There is mild prominence of the central  pulmonary vasculature. The visualized skeletal structures are unremarkable. IMPRESSION: Mild pulmonary vascular congestion Electronically Signed   By: Jonna Clark M.D.   On: 10/09/2020 18:36   CT Angio Chest/Abd/Pel for Dissection W and/or W/WO  Result Date: 10/09/2020 CLINICAL DATA:  Chest pain.  Concern for aortic dissection. EXAM: CT ANGIOGRAPHY CHEST, ABDOMEN AND PELVIS TECHNIQUE: Non-contrast CT of the chest was initially obtained. Multidetector CT imaging through the chest, abdomen and pelvis was performed using the standard protocol during bolus administration of intravenous contrast. Multiplanar reconstructed images and MIPs were obtained and reviewed to evaluate the vascular anatomy. CONTRAST:  OMNIPAQUE IOHEXOL 350 MG/ML SOLN COMPARISON:  CT dated February 19, 2016. FINDINGS: CTA CHEST FINDINGS Cardiovascular: There is no evidence for a thoracic aortic dissection or aneurysm. Mild atherosclerotic changes are noted. There is no significant pericardial effusion. The heart size is normal. There is no large centrally located pulmonary embolism. Mediastinum/Nodes: --mild mediastinal adenopathy is noted. --mild hilar adenopathy is noted. -- No axillary lymphadenopathy. -- No supraclavicular lymphadenopathy. -- Normal thyroid gland where visualized. -  Unremarkable esophagus. Lungs/Pleura: Evaluation of the lung fields is limited by respiratory motion artifact. There are streaky, linear airspace opacities throughout both lung fields without evidence for large focal infiltrate. There is atelectasis at the lung bases. There is no pneumothorax. No large pleural effusion. Musculoskeletal: No chest wall abnormality. No bony spinal canal stenosis. Review of the MIP images confirms the above findings. CTA ABDOMEN AND PELVIS FINDINGS VASCULAR Aorta: Normal caliber aorta without aneurysm, dissection, vasculitis or significant stenosis. Celiac: Patent without evidence of aneurysm, dissection, vasculitis or  significant stenosis. SMA: Patent without evidence of aneurysm, dissection, vasculitis or significant stenosis. Renals: Both renal arteries are patent without evidence of aneurysm, dissection, vasculitis, fibromuscular dysplasia or significant stenosis. IMA: Patent without evidence of aneurysm, dissection, vasculitis or significant stenosis. Inflow: Patent without evidence of aneurysm, dissection, vasculitis or significant stenosis. Veins: No obvious venous abnormality within the limitations of this arterial phase study. Review of the MIP images confirms the above findings. NON-VASCULAR Hepatobiliary: The liver is enlarged. The caudate lobe is enlarged. Liver surface appears nodular. Normal gallbladder.There is no biliary ductal dilation. Pancreas: Normal contours without ductal dilatation. No peripancreatic fluid collection. Spleen: The spleen is enlarged measuring approximately 15 cm craniocaudad. Adrenals/Urinary Tract: --Adrenal glands: Unremarkable. --Right kidney/ureter: There is mild right-sided hydroureteronephrosis secondary to an obstructing 6 mm stone in the proximal right ureter (axial series 6, image 190). --Left kidney/ureter: No hydronephrosis or radiopaque kidney stones. --Urinary bladder: Unremarkable. Stomach/Bowel: --Stomach/Duodenum: There is a gastric band in place. --Small bowel: Unremarkable. --Colon: Rectosigmoid diverticulosis without acute inflammation. --Appendix: The patient appears to be status post prior appendectomy. Lymphatic: --No retroperitoneal lymphadenopathy. --No mesenteric lymphadenopathy. --No pelvic or inguinal lymphadenopathy. Reproductive: Status post hysterectomy. No adnexal mass. Other: No ascites or free air. There is a fat containing umbilical hernia. Musculoskeletal. No acute displaced fractures. Review of the MIP images confirms the above findings. IMPRESSION: 1. No evidence for aortic dissection or aneurysm. 2. Mild right-sided hydroureteronephrosis secondary to an  obstructing 6 mm stone in the proximal right ureter. 3. Streaky, linear airspace opacities throughout both lung fields favored to represent atelectasis. 4. 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 5. There is a gastric band in place. 6. Rectosigmoid diverticulosis without acute inflammation. Aortic Atherosclerosis (ICD10-I70.0). Electronically Signed   By: Katherine Mantlehristopher  Green M.D.   On: 10/09/2020 20:01    I independently reviewed the above imaging studies.  Impression/Recommendation: 59yo F with history of nephrolithiasis who presents with likely urosepsis, found to have obstructing R ureteral stone on imaging with mild hydronephrosis. She is currently hypotensive despite resuscitation requiring pressor support.   - Given she is currently hemodynamically unstable and requiring pressors with likely urinary source, would recommend urgent R urinary decompression with percutaneous nephrostomy tube placement by VIR for a higher likelihood of quick and complete decompression over attempt at placement of ureteral stent.  - spoke to VIR on call physician over the phone who felt that currently, considering only mild hydro and body habitus,  that perc tube placement woul be very difficult at this time with preference to continue noninvasive treatment for sepsis at this time and reevaluate as necessary - recommend placement of foley catheter for complete drainage of urinary tract - agree with empiric IV antibiotics at this time with close follow up of UCx, BCx to narrow as appropriate - agree with continued resuscitation by critical care team - patient will require eventual outpatient definitive stone treatment with urology following resolution of UTI which will be arranged by urology - urology will continue to follow closely at this time  [facility]lysen Demzik 10/09/2020, 10:14 PM   I have seen and examined the patient and agree with the above assessment and plan. Pt is a 59 year  old female who I have seen previously for urolithiasis now with a 6 mm right ureteral stone and sepsis.  UA consistent with GU source.  Pt also noted to be COVID positive but due to infection in September not active infection.  Pt is not stable for operative management for stent placement under general anesthesia.  Recommend urgent PCN placement by IR for decompression of right renal collecting system.  This recommendation has been directly communicated to IR. Continue supportive treatment along with broad spectrum IV antibiotic therapy pending cultures. Will proceed with subsequent outpatient management of ureteral stone after infection resolved.

## 2020-10-09 NOTE — Progress Notes (Signed)
PHARMACY NOTE:  ANTIMICROBIAL DOSAGE ADJUSTMENT  Current antimicrobial regimen includes a mismatch between antimicrobial dosage and indication.  As per policy approved by the Pharmacy & Therapeutics and Medical Executive Committees, the antimicrobial dosage will be adjusted accordingly.  Current antimicrobial dosage:  *Ceftriaxone 1g IV q 24hrs  Indication: UTI, sepsis  Renal Function:  Estimated Creatinine Clearance: 18.2 mL/min (A) (by C-G formula based on SCr of 3.49 mg/dL (H)).     Antimicrobial dosage has been changed to:  Ceftriaxone 2g IV q 24 hrs.    Thank you for allowing pharmacy to be a part of this patient's care.  Reece Leader, Colon Flattery, BCCP Clinical Pharmacist  10/09/2020 10:06 PM   [facility] pharmacy phone numbers are listed on [email or web address]

## 2020-10-09 NOTE — Treatment Plan (Addendum)
Briefly, this is a 59yo F with history of nephrolithiasis  who presents with a few days of suprapubic/abdominal pain, frequency, urgency and dysuria associated with nausea, near syncope soon after passing a stone on 11/4 found on arrival to ED to have 22mm proximal obstructing ureteral stone with mild hydronephrosis on imaging. She is currently tachycardic to 130, hypotensive requiring pressor support despite fluid resuscitation, lactate of 9.6, Cr elevated to 3.5 from baseline 0.6. No leukocytosis. UA concerning for active infection. She is not on blood thinning medication.  - Given she is currently hemodynamically unstable and requiring pressors with likely urinary source, would recommend urgent urinary decompression with percutaneous nephrostomy tube placement by VIR for a higher likelihood of quick and complete decompression over attempt at placement of ureteral stent.   - spoke to VIR on call physician over the phone  who felt that currently, considering only mild hydro  and body habitus,  that perc tube placement would  be very difficult at this time with preference to  continue noninvasive treatment for sepsis at this  time and reevaluate as necessary - recommend placement of foley catheter for complete drainage of urinary tract - agree with empiric IV antibiotics at this time with close follow up of UCx, BCx to narrow as appropriate - agree with continued resuscitation by critical care team - patient will require eventual outpatient definitive stone treatment with urology following resolution of UTI which will be arranged by urology - urology will continue to follow closely at this time  [facility] PGY4 Urology

## 2020-10-09 NOTE — ED Notes (Signed)
RN reported critical to Dr. Adela Lank. Lactic was 9.6

## 2020-10-10 ENCOUNTER — Inpatient Hospital Stay (HOSPITAL_COMMUNITY): Payer: Self-pay

## 2020-10-10 ENCOUNTER — Encounter (HOSPITAL_COMMUNITY): Payer: Self-pay | Admitting: Pulmonary Disease

## 2020-10-10 DIAGNOSIS — R6521 Severe sepsis with septic shock: Secondary | ICD-10-CM

## 2020-10-10 HISTORY — PX: IR NEPHROSTOMY PLACEMENT RIGHT: IMG6064

## 2020-10-10 LAB — BASIC METABOLIC PANEL
Anion gap: 16 — ABNORMAL HIGH (ref 5–15)
BUN: 49 mg/dL — ABNORMAL HIGH (ref 8–23)
CO2: 18 mmol/L — ABNORMAL LOW (ref 22–32)
Calcium: 8.2 mg/dL — ABNORMAL LOW (ref 8.9–10.3)
Chloride: 103 mmol/L (ref 98–111)
Creatinine, Ser: 2.97 mg/dL — ABNORMAL HIGH (ref 0.44–1.00)
GFR, Estimated: 17 mL/min — ABNORMAL LOW (ref >60)
Glucose, Bld: 146 mg/dL — ABNORMAL HIGH (ref 70–99)
Potassium: 4.7 mmol/L (ref 3.5–5.1)
Sodium: 137 mmol/L (ref 135–145)

## 2020-10-10 LAB — COMPREHENSIVE METABOLIC PANEL
ALT: 33 U/L (ref 0–44)
AST: 49 U/L — ABNORMAL HIGH (ref 15–41)
Albumin: 2.6 g/dL — ABNORMAL LOW (ref 3.5–5.0)
Alkaline Phosphatase: 65 U/L (ref 38–126)
Anion gap: 22 — ABNORMAL HIGH (ref 5–15)
BUN: 44 mg/dL — ABNORMAL HIGH (ref 8–23)
CO2: 13 mmol/L — ABNORMAL LOW (ref 22–32)
Calcium: 8.4 mg/dL — ABNORMAL LOW (ref 8.9–10.3)
Chloride: 104 mmol/L (ref 98–111)
Creatinine, Ser: 3.25 mg/dL — ABNORMAL HIGH (ref 0.44–1.00)
GFR, Estimated: 16 mL/min — ABNORMAL LOW (ref >60)
Glucose, Bld: 120 mg/dL — ABNORMAL HIGH (ref 70–99)
Potassium: 4.9 mmol/L (ref 3.5–5.1)
Sodium: 139 mmol/L (ref 135–145)
Total Bilirubin: 1 mg/dL (ref 0.3–1.2)
Total Protein: 6.1 g/dL — ABNORMAL LOW (ref 6.5–8.1)

## 2020-10-10 LAB — CBC
HCT: 37.5 % (ref 36.0–46.0)
HCT: 40.1 % (ref 36.0–46.0)
Hemoglobin: 12.1 g/dL (ref 12.0–15.0)
Hemoglobin: 12.6 g/dL (ref 12.0–15.0)
MCH: 27.6 pg (ref 26.0–34.0)
MCH: 27.5 pg (ref 26.0–34.0)
MCHC: 32.3 g/dL (ref 30.0–36.0)
MCHC: 31.4 g/dL (ref 30.0–36.0)
MCV: 85.6 fL (ref 80.0–100.0)
MCV: 87.6 fL (ref 80.0–100.0)
Platelets: 43 10*3/uL — ABNORMAL LOW (ref 150–400)
Platelets: 34 10*3/uL — ABNORMAL LOW (ref 150–400)
RBC: 4.38 MIL/uL (ref 3.87–5.11)
RBC: 4.58 MIL/uL (ref 3.87–5.11)
RDW: 15.1 % (ref 11.5–15.5)
RDW: 15.5 % (ref 11.5–15.5)
WBC: 14.6 10*3/uL — ABNORMAL HIGH (ref 4.0–10.5)
WBC: 2.1 10*3/uL — ABNORMAL LOW (ref 4.0–10.5)
nRBC: 0 % (ref 0.0–0.2)
nRBC: 1 % — ABNORMAL HIGH (ref 0.0–0.2)

## 2020-10-10 LAB — BLOOD CULTURE ID PANEL (REFLEXED) - BCID2

## 2020-10-10 LAB — LACTIC ACID, PLASMA
Lactic Acid, Venous: 6.5 mmol/L (ref 0.5–1.9)
Lactic Acid, Venous: 5.5 mmol/L (ref 0.5–1.9)
Lactic Acid, Venous: 5.3 mmol/L (ref 0.5–1.9)

## 2020-10-10 LAB — TROPONIN I (HIGH SENSITIVITY)
Troponin I (High Sensitivity): 460 ng/L (ref <18)
Troponin I (High Sensitivity): 700 ng/L (ref <18)

## 2020-10-10 LAB — PROTIME-INR
INR: 1.4 — ABNORMAL HIGH (ref 0.8–1.2)
Prothrombin Time: 16.4 seconds — ABNORMAL HIGH (ref 11.4–15.2)

## 2020-10-10 LAB — GLUCOSE, CAPILLARY
Glucose-Capillary: 108 mg/dL — ABNORMAL HIGH (ref 70–99)
Glucose-Capillary: 111 mg/dL — ABNORMAL HIGH (ref 70–99)
Glucose-Capillary: 112 mg/dL — ABNORMAL HIGH (ref 70–99)
Glucose-Capillary: 118 mg/dL — ABNORMAL HIGH (ref 70–99)
Glucose-Capillary: 120 mg/dL — ABNORMAL HIGH (ref 70–99)
Glucose-Capillary: 131 mg/dL — ABNORMAL HIGH (ref 70–99)
Glucose-Capillary: 71 mg/dL (ref 70–99)

## 2020-10-10 LAB — HEMOGLOBIN A1C
Hgb A1c MFr Bld: 5.8 % — ABNORMAL HIGH (ref 4.8–5.6)
Mean Plasma Glucose: 119.76 mg/dL

## 2020-10-10 LAB — C DIFFICILE QUICK SCREEN W PCR REFLEX
C Diff antigen: NEGATIVE
C Diff interpretation: NOT DETECTED
C Diff toxin: NEGATIVE

## 2020-10-10 LAB — ECHOCARDIOGRAM COMPLETE
Area-P 1/2: 3.19 cm2
Height: 64 in
S' Lateral: 2.8 cm
Weight: 3120 oz

## 2020-10-10 LAB — HEMOGLOBIN AND HEMATOCRIT, BLOOD
HCT: 34.4 % — ABNORMAL LOW (ref 36.0–46.0)
Hemoglobin: 11.2 g/dL — ABNORMAL LOW (ref 12.0–15.0)

## 2020-10-10 LAB — MRSA PCR SCREENING: MRSA by PCR: NEGATIVE

## 2020-10-10 LAB — HIV ANTIBODY (ROUTINE TESTING W REFLEX): HIV Screen 4th Generation wRfx: NONREACTIVE

## 2020-10-10 MED ORDER — SODIUM CHLORIDE 0.9 % IV SOLN
2.0000 g | INTRAVENOUS | Status: DC
  Administered 2020-10-10 – 2020-10-13 (×4): 2 g via INTRAVENOUS
  Filled 2020-10-10 (×4): qty 2

## 2020-10-10 MED ORDER — PERFLUTREN LIPID MICROSPHERE
1.0000 mL | INTRAVENOUS | Status: AC | PRN
  Administered 2020-10-10: 2.5 mL via INTRAVENOUS
  Filled 2020-10-10: qty 10

## 2020-10-10 MED ORDER — CEFAZOLIN SODIUM-DEXTROSE 2-4 GM/100ML-% IV SOLN
INTRAVENOUS | Status: AC
  Administered 2020-10-10: 2000 mg
  Filled 2020-10-10: qty 100

## 2020-10-10 MED ORDER — MIDAZOLAM HCL 2 MG/2ML IJ SOLN
INTRAMUSCULAR | Status: AC | PRN
  Administered 2020-10-10: 0.5 mg via INTRAVENOUS

## 2020-10-10 MED ORDER — MIDAZOLAM HCL 2 MG/2ML IJ SOLN
INTRAMUSCULAR | Status: AC
  Filled 2020-10-10: qty 2

## 2020-10-10 MED ORDER — FENTANYL CITRATE (PF) 100 MCG/2ML IJ SOLN
INTRAMUSCULAR | Status: AC | PRN
Start: 2020-10-10 — End: 2020-10-10
  Administered 2020-10-10 (×3): 25 ug via INTRAVENOUS

## 2020-10-10 MED ORDER — LIP MEDEX EX OINT
TOPICAL_OINTMENT | CUTANEOUS | Status: DC | PRN
  Filled 2020-10-10: qty 7

## 2020-10-10 MED ORDER — ORAL CARE MOUTH RINSE
15.0000 mL | Freq: Two times a day (BID) | OROMUCOSAL | Status: DC
  Administered 2020-10-10 – 2020-10-16 (×13): 15 mL via OROMUCOSAL

## 2020-10-10 MED ORDER — LACTATED RINGERS IV SOLN: INTRAVENOUS | Status: AC

## 2020-10-10 MED ORDER — IOHEXOL 300 MG/ML  SOLN
50.0000 mL | Freq: Once | INTRAMUSCULAR | Status: AC | PRN
  Administered 2020-10-10: 8 mL

## 2020-10-10 MED ORDER — MORPHINE SULFATE (PF) 2 MG/ML IV SOLN
1.0000 mg | INTRAVENOUS | Status: DC | PRN
  Administered 2020-10-10 – 2020-10-11 (×4): 1 mg via INTRAVENOUS
  Filled 2020-10-10 (×4): qty 1

## 2020-10-10 MED ORDER — LIDOCAINE HCL (PF) 1 % IJ SOLN
INTRAMUSCULAR | Status: AC | PRN
  Administered 2020-10-10: 10 mL

## 2020-10-10 MED ORDER — SODIUM BICARBONATE 8.4 % IV SOLN
100.0000 meq | Freq: Once | INTRAVENOUS | Status: AC
  Administered 2020-10-10: 100 meq via INTRAVENOUS
  Filled 2020-10-10: qty 50

## 2020-10-10 MED ORDER — ONDANSETRON HCL 4 MG/2ML IJ SOLN
4.0000 mg | Freq: Four times a day (QID) | INTRAMUSCULAR | Status: DC | PRN
  Administered 2020-10-10 (×2): 4 mg via INTRAVENOUS
  Filled 2020-10-10 (×2): qty 2

## 2020-10-10 MED ORDER — SODIUM CHLORIDE 0.9% FLUSH
5.0000 mL | Freq: Three times a day (TID) | INTRAVENOUS | Status: DC
  Administered 2020-10-10 – 2020-10-15 (×14): 5 mL

## 2020-10-10 MED ORDER — LIDOCAINE HCL 1 % IJ SOLN
INTRAMUSCULAR | Status: AC
  Filled 2020-10-10: qty 20

## 2020-10-10 MED ORDER — ALPRAZOLAM 0.25 MG PO TABS
0.2500 mg | ORAL_TABLET | Freq: Two times a day (BID) | ORAL | Status: DC | PRN
  Administered 2020-10-10 – 2020-10-14 (×4): 0.25 mg via ORAL
  Filled 2020-10-10 (×5): qty 1

## 2020-10-10 MED ORDER — SERTRALINE HCL 50 MG PO TABS
150.0000 mg | ORAL_TABLET | Freq: Every day | ORAL | Status: DC
  Administered 2020-10-10 – 2020-10-16 (×7): 150 mg via ORAL
  Filled 2020-10-10: qty 3
  Filled 2020-10-10: qty 1
  Filled 2020-10-10 (×3): qty 3
  Filled 2020-10-10: qty 1
  Filled 2020-10-10: qty 3
  Filled 2020-10-10: qty 1

## 2020-10-10 MED ORDER — ASPIRIN EC 81 MG PO TBEC
81.0000 mg | DELAYED_RELEASE_TABLET | Freq: Every day | ORAL | Status: DC
  Administered 2020-10-10 – 2020-10-11 (×2): 81 mg via ORAL
  Filled 2020-10-10 (×2): qty 1

## 2020-10-10 MED ORDER — CHLORHEXIDINE GLUCONATE CLOTH 2 % EX PADS
6.0000 | MEDICATED_PAD | Freq: Every day | CUTANEOUS | Status: DC
  Administered 2020-10-10 – 2020-10-13 (×5): 6 via TOPICAL

## 2020-10-10 MED ORDER — FENTANYL CITRATE (PF) 100 MCG/2ML IJ SOLN
INTRAMUSCULAR | Status: AC
  Filled 2020-10-10: qty 2

## 2020-10-10 NOTE — Progress Notes (Signed)
Subjective: Patient feeling subjectively slightly better than yesterday Still complaints of relatively equal bilateral lower back pain and lower abdominal/suprapubic pain. NE able to be weaned overnight  Remains afebrile Foley inserted overnight, decreased UOP Cr only slightly improved Lactate improving Now with leukocytosis  Objective: Vital signs in last 24 hours: Temp:  [97.5 F (36.4 C)-99.1 F (37.3 C)] 97.5 F (36.4 C) (11/08 0700) Pulse Rate:  [105-128] 105 (11/08 0600) Resp:  [14-33] 24 (11/08 0600) BP: (75-102)/(32-75) 97/72 (11/08 0600) SpO2:  [92 %-97 %] 96 % (11/08 0600) Weight:  [88.5 kg] 88.5 kg (11/07 1733)  Intake/Output from previous day: 11/07 0701 - 11/08 0700 In: 1301.8 [I.V.:1099.1; IV Piggyback:202.6] Out: 260 [Urine:260] Intake/Output this shift: No intake/output data recorded.  Physical Exam:  General: Alert and oriented CV: tachycardic Lungs: slightly increased work of breathing with [place] Abdomen: Soft, ND GU: foley in place draining clear yellow urine. Mild bilateral CVA tenderness, unchanged from prior Ext: NT, No erythema  Lab Results: Recent Labs    10/09/20 1750 10/10/20 0133  HGB 12.5 12.1  HCT 39.1 37.5   BMET Recent Labs    10/09/20 1958 10/10/20 0429  NA 136 139  K 4.0 4.9  CL 103 104  CO2 15* 13*  GLUCOSE 110* 120*  BUN 38* 44*  CREATININE 3.49* 3.25*  CALCIUM 8.0* 8.4*     Studies/Results: DG Chest Port 1 View  Result Date: 10/09/2020 CLINICAL DATA:  Questionable sepsis EXAM: PORTABLE CHEST 1 VIEW COMPARISON:  March 10, 2017 FINDINGS: The heart size and mediastinal contours are within normal limits. There is mild prominence of the central pulmonary vasculature. The visualized skeletal structures are unremarkable. IMPRESSION: Mild pulmonary vascular congestion Electronically Signed   By: Jonna Clark M.D.   On: 10/09/2020 18:36   CT Angio Chest/Abd/Pel for Dissection W and/or W/WO  Result Date: 10/09/2020 CLINICAL  DATA:  Chest pain.  Concern for aortic dissection. EXAM: CT ANGIOGRAPHY CHEST, ABDOMEN AND PELVIS TECHNIQUE: Non-contrast CT of the chest was initially obtained. Multidetector CT imaging through the chest, abdomen and pelvis was performed using the standard protocol during bolus administration of intravenous contrast. Multiplanar reconstructed images and MIPs were obtained and reviewed to evaluate the vascular anatomy. CONTRAST:  OMNIPAQUE IOHEXOL 350 MG/ML SOLN COMPARISON:  CT dated February 19, 2016. FINDINGS: CTA CHEST FINDINGS Cardiovascular: There is no evidence for a thoracic aortic dissection or aneurysm. Mild atherosclerotic changes are noted. There is no significant pericardial effusion. The heart size is normal. There is no large centrally located pulmonary embolism. Mediastinum/Nodes: --mild mediastinal adenopathy is noted. --mild hilar adenopathy is noted. -- No axillary lymphadenopathy. -- No supraclavicular lymphadenopathy. -- Normal thyroid gland where visualized. -  Unremarkable esophagus. Lungs/Pleura: Evaluation of the lung fields is limited by respiratory motion artifact. There are streaky, linear airspace opacities throughout both lung fields without evidence for large focal infiltrate. There is atelectasis at the lung bases. There is no pneumothorax. No large pleural effusion. Musculoskeletal: No chest wall abnormality. No bony spinal canal stenosis. Review of the MIP images confirms the above findings. CTA ABDOMEN AND PELVIS FINDINGS VASCULAR Aorta: Normal caliber aorta without aneurysm, dissection, vasculitis or significant stenosis. Celiac: Patent without evidence of aneurysm, dissection, vasculitis or significant stenosis. SMA: Patent without evidence of aneurysm, dissection, vasculitis or significant stenosis. Renals: Both renal arteries are patent without evidence of aneurysm, dissection, vasculitis, fibromuscular dysplasia or significant stenosis. IMA: Patent without evidence of  aneurysm, dissection, vasculitis or significant stenosis. Inflow: Patent without evidence of  aneurysm, dissection, vasculitis or significant stenosis. Veins: No obvious venous abnormality within the limitations of this arterial phase study. Review of the MIP images confirms the above findings. NON-VASCULAR Hepatobiliary: The liver is enlarged. The caudate lobe is enlarged. Liver surface appears nodular. Normal gallbladder.There is no biliary ductal dilation. Pancreas: Normal contours without ductal dilatation. No peripancreatic fluid collection. Spleen: The spleen is enlarged measuring approximately 15 cm craniocaudad. Adrenals/Urinary Tract: --Adrenal glands: Unremarkable. --Right kidney/ureter: There is mild right-sided hydroureteronephrosis secondary to an obstructing 6 mm stone in the proximal right ureter (axial series 6, image 190). --Left kidney/ureter: No hydronephrosis or radiopaque kidney stones. --Urinary bladder: Unremarkable. Stomach/Bowel: --Stomach/Duodenum: There is a gastric band in place. --Small bowel: Unremarkable. --Colon: Rectosigmoid diverticulosis without acute inflammation. --Appendix: The patient appears to be status post prior appendectomy. Lymphatic: --No retroperitoneal lymphadenopathy. --No mesenteric lymphadenopathy. --No pelvic or inguinal lymphadenopathy. Reproductive: Status post hysterectomy. No adnexal mass. Other: No ascites or free air. There is a fat containing umbilical hernia. Musculoskeletal. No acute displaced fractures. Review of the MIP images confirms the above findings. IMPRESSION: 1. No evidence for aortic dissection or aneurysm. 2. Mild right-sided hydroureteronephrosis secondary to an obstructing 6 mm stone in the proximal right ureter. 3. Streaky, linear airspace opacities throughout both lung fields favored to represent atelectasis. 4. 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 5. There is a gastric band in place. 6. Rectosigmoid  diverticulosis without acute inflammation. Aortic Atherosclerosis (ICD10-I70.0). Electronically Signed   By: Katherine Mantle M.D.   On: 10/09/2020 20:01    Assessment/Plan: 59yo F with history of nephrolithiasis who presents with likely urosepsis, found to have obstructing R ureteral stone on imaging with mild hydronephrosis. She remains on pressor support for hypotension at this time.    - continue to recommend R urinary decompression with percutaneous nephrostomy tube placement by VIRfor a higher likelihood of quick and complete decompression over attempt at placement of ureteral stent, however if VIR continues to feel that placement will be too difficult, may consider attempt at cysto, retrograde stent placement given patient is more stable than yesterday evening.   - recommend continuing foley catheter for complete drainage of urinary tract - agree with continued empiric IV antibiotics at this time with close follow up of UCx, BCx to narrow as appropriate - agree with and appreciate continued support by critical care team - patient will require eventual outpatient definitive stone treatment with urology following resolution of UTI which will be arranged by urology - urology will continue to follow closely at this time   LOS: 1 day   Rashad Obeid [facility] 10/10/2020, 8:28 AM

## 2020-10-10 NOTE — Progress Notes (Signed)
eLink Physician-Brief Progress Note Patient Name: Shirley Sullivan DOB: 1961/10/04 MRN: [id number]   Date of Service  10/10/2020  HPI/Events of Note  Patient having anxiety for which she takes PRN Xanax at home.  eICU Interventions  Xanax 0.25 mg po BID PRN anxiety ordered.        Thomasene Lot Karthikeya Funke 10/10/2020, 2:39 AM

## 2020-10-10 NOTE — Progress Notes (Addendum)
PHARMACY - PHYSICIAN COMMUNICATION CRITICAL VALUE ALERT - BLOOD CULTURE IDENTIFICATION (BCID)  Shirley Sullivan is an 59 y.o. female who presented to [facility] on 10/09/2020 with a chief complaint of sepsis.   Assessment:  Pt presented with abd/flank pain. Lab called with BCID result>>3/4 bottles with klebsiella oxytoca and enterobacter cloacae. Cefepime would be a good choice. Team will make MD aware.   Name of physician (or Provider) Contacted: ICU pharmacy team  Current antibiotics: Cefepime  Changes to prescribed antibiotics recommended:  None  Results for orders placed or performed during the hospital encounter of 10/09/20  Blood Culture ID Panel (Reflexed) (Collected: 10/09/2020  5:50 PM)  Result Value Ref Range   Enterococcus faecalis NOT DETECTED NOT DETECTED   Enterococcus Faecium NOT DETECTED NOT DETECTED   Listeria monocytogenes NOT DETECTED NOT DETECTED   Staphylococcus species NOT DETECTED NOT DETECTED   Staphylococcus aureus (BCID) NOT DETECTED NOT DETECTED   Staphylococcus epidermidis NOT DETECTED NOT DETECTED   Staphylococcus lugdunensis NOT DETECTED NOT DETECTED   Streptococcus species NOT DETECTED NOT DETECTED   Streptococcus agalactiae NOT DETECTED NOT DETECTED   Streptococcus pneumoniae NOT DETECTED NOT DETECTED   Streptococcus pyogenes NOT DETECTED NOT DETECTED   A.calcoaceticus-baumannii NOT DETECTED NOT DETECTED   Bacteroides fragilis NOT DETECTED NOT DETECTED   Enterobacterales DETECTED (A) NOT DETECTED   Enterobacter cloacae complex DETECTED (A) NOT DETECTED   Escherichia coli NOT DETECTED NOT DETECTED   Klebsiella aerogenes NOT DETECTED NOT DETECTED   Klebsiella oxytoca DETECTED (A) NOT DETECTED   Klebsiella pneumoniae NOT DETECTED NOT DETECTED   Proteus species NOT DETECTED NOT DETECTED   Salmonella species NOT DETECTED NOT DETECTED   Serratia marcescens NOT DETECTED NOT DETECTED   Haemophilus influenzae NOT DETECTED NOT DETECTED   Neisseria  meningitidis NOT DETECTED NOT DETECTED   Pseudomonas aeruginosa NOT DETECTED NOT DETECTED   Stenotrophomonas maltophilia NOT DETECTED NOT DETECTED   Candida albicans NOT DETECTED NOT DETECTED   Candida auris NOT DETECTED NOT DETECTED   Candida glabrata NOT DETECTED NOT DETECTED   Candida krusei NOT DETECTED NOT DETECTED   Candida parapsilosis NOT DETECTED NOT DETECTED   Candida tropicalis NOT DETECTED NOT DETECTED   Cryptococcus neoformans/gattii NOT DETECTED NOT DETECTED   CTX-M ESBL NOT DETECTED NOT DETECTED   Carbapenem resistance IMP NOT DETECTED NOT DETECTED   Carbapenem resistance KPC NOT DETECTED NOT DETECTED   Carbapenem resistance NDM NOT DETECTED NOT DETECTED   Carbapenem resist OXA 48 LIKE NOT DETECTED NOT DETECTED   Carbapenem resistance VIM NOT DETECTED NOT DETECTED    Ulyses Southward, PharmD, BCIDP, AAHIVP, CPP Infectious Disease Pharmacist 10/10/2020 9:51 AM

## 2020-10-10 NOTE — Progress Notes (Signed)
eLink Physician-Brief Progress Note Patient Name: [name] DOB: 25-Sep-1961 MRN: [id number]   Date of Service  10/10/2020  HPI/Events of Note  Notified of borderline low glucose Was placed on NPO for nephrostomy tube insertion  eICU Interventions  Will start clear liquids for now     Intervention Category Intermediate Interventions: Other: Minor Interventions: Other:  Darl Pikes 10/10/2020, 8:48 PM

## 2020-10-10 NOTE — Progress Notes (Signed)
Echocardiogram 2D Echocardiogram has been performed.  Shirley Sullivan 10/10/2020, 11:30 AM

## 2020-10-10 NOTE — Procedures (Signed)
Interventional Radiology Procedure Note  Procedure: Right percutaneous nephrostomy tube placement  Complications: None  Estimated Blood Loss: < 10 mL  Findings: Difficult PCN placement with bleeding into collecting system during placement. 10 Fr tube formed in central collecting system with bloody urine return and clot in collecting system. Connected to gravity bag. Will irrigate tube.  Jodi Marble. Fredia Sorrow, M.D Pager:  [phone]

## 2020-10-10 NOTE — H&P (Signed)
NAME:  Shirley Sullivan, MRN:  [id number], DOB:  1961-02-23, LOS: 1 ADMISSION DATE:  10/09/2020, CONSULTATION DATE:  10/09/2020 REFERRING MD: [place] Robinson, PA, CHIEF COMPLAINT:  Septic shock  Brief History   59 year old female presenting with abdominal/ flank pain, UTI symptoms, N/V/D, and intermittent chest pain since passing a kidney stone on 11/4.  Found to be in septic shock with UTI and obstructing right ureteral stone with mild hydronephrosis with AKI requiring vasopressor support.   History of present illness   59 year old female with DM, asthma, nephrolithiasis, lupus (no flare in last 5 years/ not on home, depression/ anxiety, arthritis, and COVID-19 (positive on 08/15/2020) presenting to ER with complaints of not feeling well since Thursday.  She thinks she passed a kidney stone on the right and since has continued to fell bad with suprapubic abdominal pain, bilateral flank pain, back pain, nausea and vomiting, decreased urinary output, dysuria, some confusion, generalized weakness, diarrhea, complaints of feeling hot, intermittent midsternum chest pain, and mild shortness of breath.   In ER, she was afebrile, tachycardic 120, hypotensive 75/59, and 93% on room air.   Labs noted for normal WBC, platelets 42, lactic 9.6-> 7.7, CO2 15, glucose 110, BUN 38, sCr 3.49, AG 18, albumin 2.3, AST 43, t. Protein 5.4, BNP 1372, troponin hs 429, INR 1.5, PT 17, UA with large Hgb, trace leukocytes, positive nitrates, protein 100, high SG, many bacteria, RBC > 50, > 50 WBC. EKG unchanged from prior.  CXR showed mild pulmonary vascular congestion, and given back and chest pain with concern for dissection, CTA chest/ abd/ pelvis was negative for dissection or aneurysm, but showed an obstructing 6 mm stone in the proximal right ureter with mild right sided hydroureteronephrosis; also noted for hepatosplenomegaly with likely underlying cirrhosis and portal hypertension.  She received a total of 3L of fluids with  persistent hypotension, therefore levophed was started.  Urology and IR were consulted.  At this time, IR is not recommending percutaneous nephrostomy tube due to mild hydronephrosis and body habitus.  PCCM called for admission.   Past Medical History  DM, asthma, nephrolithiasis, lupus, depression/ anxiety, COVID 19 (positive on 08/15/2020), arthritis, previous lap band placement, Non smoker, non drinker  Significant Hospital Events   11/7 admitted   Consults:  Urology IR   Procedures:      Significant Diagnostic Tests:  10/09/2020 CTA chest/ abd/ pelvis >> 1. No evidence for aortic dissection or aneurysm. 2. Mild right-sided hydroureteronephrosis secondary to an obstructing 6 mm stone in the proximal right ureter. 3. Streaky, linear airspace opacities throughout both lung fields favored to represent atelectasis. 4. 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 5. There is a gastric band in place. 6. Rectosigmoid diverticulosis without acute inflammation.  Aortic Atherosclerosis  Micro Data:  11/7 BCx2 >>gram neg rodsx2>>Enterobacterales, Enterobacter cloacae, Klebsiella oxytoca>> 11/7 UC >> 11/7 SARS 2 >> positive   Flu >> neg  Antimicrobials:  Ceftriaxone 11/7-11/8 Cefepime 11/8-  Interim history/subjective:  Pt feeling worsening back pain and nausea this morning Blood cultures growing enterobacter and klebsiella Levophed down to Denies chest pain  Objective   Blood pressure 97/72, pulse (!) 105, temperature 99.1 F (37.3 C), temperature source Axillary, resp. rate (!) 24, height 5\' 4"  (1.626 m), weight 88.5 kg, SpO2 96 %.        Intake/Output Summary (Last 24 hours) at 10/10/2020 0734 Last data filed at 10/10/2020 0600 Gross per 24 hour  Intake 1301.76 ml  Output 260 ml  Net 1041.76 ml   Filed Weights   10/09/20 1733  Weight: 88.5 kg   Examination: General:  Uncomfortable-appearing F, awake and alert HEENT: MM  pink/dry Neuro: alert, oriented, moving all extremities CV: rr, ST, no murmur PULM: clear bilaterally on room air GI: obese, soft, +bs  Extremities: cool/dry, no LE edema  Skin: no rashes  Resolved Hospital Problem list    Assessment & Plan:   Septic shock and Enterobacter/Klebsiella bacteremia secondary to UTI and pyelonephritis On peripheral Norepi with increasing leukocytosis  P:  -Peripheral NE for MAP goal > 65, no current need for CVC -Lactic acid down-trending, repeat once more  -Stop ceftriaxone and initiate Cefepime -Follow urine culture and blood cultures for sensitivities  -Received 30cc/kg, decrease LR to 100cc/hr -Definitive treatment of obstructing stone per IR and urology -Nausea and pain control   Obstructing nephrolithiasis, 6 mm in proximal right ureter, with mild hydronephrosis Urine cultures are pending, BC as above P:  -Urology re-evaluated this AM and still recommends R urinary decompression per IR, however if IR not amenable will consider cysto and retrograde stent - Follow up with IR - foley in place, draining ~400cc since admission    Acute Kidney Injury Most likely post-obstruction related to obstructing nephrolithiasis, may be component of pre-renal given N/V/D.  Baseline creatinine normal three years ago P: -Creatinine 3.2 this AM, 3.4 on admission -K 4.9 -Continue IVF, monitor renal indices and UOP and avoid nephrotoxins    AGMA/ lactic acidosis  s/p 3L IVF on admission Lactic down-trending, slightly worsening acidosis  P:  -likely secondary to combination of renal failure and acidosis,  -trend, treat underlying sepsis and renal obstruction  Elevated INR  Thrombocytopenia - suspect related to gram negative bacteremia vs underlying liver disease/ hepatic congestion P:  Trend CBC/ coags Monitor for bleeding- currently no signs, Hgb wnl   Elevated troponin hs/ BNP  Chest pain resolved R/O demand ischemia vs ACS P:  -Aspirin -trend  trop -echo pending  Hepatosplenomegaly  with possibly underlying cirrhosis and portal hypertension, denies ETOH P:  -LFT's and INR improving -Will need further GI outpatient workup  Mild hypoxemia - atelectasis noted on CTA P:  Aggressive pulmonary hygiene Supplemental O2 prn   COVID positive-  initially positive on 9/13 P:  Out of 21 day window, likely still viral shedding  DM P:  Trend CBG q 4, add SSI if need for   Best practice:  Diet: NPO Pain/Anxiety/Delirium protocol (if indicated): morphine prn VAP protocol (if indicated): n/a DVT prophylaxis: SCDs given thrombocytopenia  GI prophylaxis: n/a Glucose control: CBG q 4 Mobility: BR Code Status: full  Family Communication: patient  updated on plan of care Disposition: admit to ICU   Labs   CBC: Recent Labs  Lab 10/09/20 1750 10/10/20 0133  WBC 6.6 14.6*  NEUTROABS 5.3  --   HGB 12.5 12.1  HCT 39.1 37.5  MCV 87.7 85.6  PLT 42* 43*    Basic Metabolic Panel: Recent Labs  Lab 10/09/20 1958 10/10/20 0429  NA 136 139  K 4.0 4.9  CL 103 104  CO2 15* 13*  GLUCOSE 110* 120*  BUN 38* 44*  CREATININE 3.49* 3.25*  CALCIUM 8.0* 8.4*   GFR: Estimated Creatinine Clearance: 19.6 mL/min (A) (by C-G formula based on SCr of 3.25 mg/dL (H)). Recent Labs  Lab 10/09/20 1750 10/09/20 1958 10/10/20 0126 10/10/20 0133 10/10/20 0428  WBC 6.6  --   --  14.6*  --  LATICACIDVEN 9.6* 7.7* 6.5*  --  5.5*    Liver Function Tests: Recent Labs  Lab 10/09/20 1958 10/10/20 0429  AST 43* 49*  ALT 30 33  ALKPHOS 51 65  BILITOT 0.8 1.0  PROT 5.4* 6.1*  ALBUMIN 2.3* 2.6*   No results for input(s): LIPASE, AMYLASE in the last 168 hours. No results for input(s): AMMONIA in the last 168 hours.  ABG No results found for: PHART, PCO2ART, PO2ART, HCO3, TCO2, ACIDBASEDEF, O2SAT   Coagulation Profile: Recent Labs  Lab 10/09/20 1750 10/10/20 0133  INR 1.5* 1.4*    Cardiac Enzymes: No results for input(s):  CKTOTAL, CKMB, CKMBINDEX, TROPONINI in the last 168 hours.  HbA1C: Hgb A1c MFr Bld  Date/Time Value Ref Range Status  10/10/2020 01:33 AM 5.8 (H) 4.8 - 5.6 % Final    Comment:    (NOTE) Pre diabetes:          5.7%-6.4%  Diabetes:              >6.4%  Glycemic control for   <7.0% adults with diabetes     CBG: Recent Labs  Lab 10/10/20 0059 10/10/20 0309  GLUCAP 111* 108*    Review of Systems:   Review of Systems  Constitutional: Positive for malaise/fatigue. Negative for fever.  Respiratory: Positive for shortness of breath. Negative for cough, sputum production and wheezing.   Cardiovascular: Positive for chest pain. Negative for palpitations and leg swelling.  Gastrointestinal: Positive for abdominal pain, diarrhea, nausea and vomiting.  Genitourinary: Positive for dysuria and flank pain.  Musculoskeletal: Positive for back pain.  Skin: Negative for rash.  Neurological: Positive for weakness. Negative for focal weakness.   Past Medical History  She,  has a past medical history of Allergy, Anxiety, Arthritis, Asthma, COVID-19, Depression, Diabetes mellitus without complication (HCC), History of kidney stones (1998, 2015), and Lupus (HCC).   Surgical History    Past Surgical History:  Procedure Laterality Date  . ABDOMINAL HYSTERECTOMY  2004  . APPENDECTOMY  1992  . CYSTOSCOPY WITH RETROGRADE PYELOGRAM, URETEROSCOPY AND STENT PLACEMENT Left 08/11/2014   Procedure: CYSTOSCOPY WITH RETROGRADE PYELOGRAM, URETEROSCOPY AND STENT PLACEMENT,  DIGITAL FLEXIBLE URETEROSCOPE;  Surgeon: Heloise Purpura, MD;  Location: [facility];  Service: Urology;  Laterality: Left;  request digital flexible ureteroscope  . CYSTOSCOPY WITH RETROGRADE PYELOGRAM, URETEROSCOPY AND STENT PLACEMENT Left 08/23/2014   Procedure: CYSTOSCOPY WITH RETROGRADE PYELOGRAM, URETEROSCOPY AND STENT EXCHANGE;  Surgeon: Heloise Purpura, MD;  Location: [facility];  Service: Urology;  Laterality: Left;  . HOLMIUM LASER APPLICATION  Left 08/23/2014   Procedure: HOLMIUM LASER APPLICATION;  Surgeon: Heloise Purpura, MD;  Location: [facility];  Service: Urology;  Laterality: Left;  . ROTATOR CUFF REPAIR Right 2007     Social History   reports that she has never smoked. She has never used smokeless tobacco. She reports that she does not drink alcohol and does not use drugs.   Family History   Her family history includes Hyperlipidemia in her mother.   Allergies Allergies  Allergen Reactions  . Codeine Itching  . Strawberry Extract Hives, Itching and Other (See Comments)    Can't breathe  . Vibramycin [Doxycycline Calcium] Nausea And Vomiting     Home Medications  Prior to Admission medications   Medication Sig Start Date End Date Taking? Authorizing Provider  albuterol (PROVENTIL HFA;VENTOLIN HFA) 108 (90 BASE) MCG/ACT inhaler Inhale 1 puff into the lungs every 4 (four) hours as needed for wheezing or shortness of breath. 01/20/15  Yes Andrena Mews, DO  ALPRAZolam Prudy Feeler) 0.5 MG tablet Take 0.5 mg by mouth 2 (two) times daily as needed for anxiety.    Yes [provider]  aspirin EC 81 MG tablet Take 81 mg by mouth daily as needed (chest pain).    Yes [provider]  cetirizine (ZYRTEC) 10 MG tablet Take 10 mg by mouth at bedtime.    Yes [provider]  gabapentin (NEURONTIN) 300 MG capsule Take 300 mg by mouth 2 (two) times daily.  09/23/20  Yes [provider]  ibuprofen (ADVIL,MOTRIN) 200 MG tablet Take 400 mg by mouth every 6 (six) hours as needed for moderate pain (right knee pain).    Yes [provider]  sertraline (ZOLOFT) 100 MG tablet Take 150 mg by mouth at bedtime.    Yes [provider]  benzonatate (TESSALON) 100 MG capsule Take 1 capsule (100 mg total) by mouth 2 (two) times daily as needed for cough. Patient not taking: Reported on 02/19/2016 01/20/15   Andrena Mews, DO  HYDROcodone-acetaminophen (NORCO/VICODIN) 5-325 MG per tablet Take 1-2 tablets  by mouth every 4 (four) hours as needed. Patient not taking: Reported on 02/19/2016 01/24/15   Linwood Dibbles, MD  ondansetron (ZOFRAN ODT) 4 MG disintegrating tablet 4mg  ODT q4 hours prn nausea/vomit Patient not taking: Reported on 03/10/2017 02/19/16   02/21/16, MD  oxyCODONE-acetaminophen (PERCOCET) 5-325 MG tablet Take 2 tablets by mouth every 4 (four) hours as needed. Patient not taking: Reported on 03/10/2017 02/19/16   02/21/16, MD  pantoprazole (PROTONIX) 20 MG tablet Take 1 tablet (20 mg total) by mouth daily. Patient not taking: Reported on 02/19/2016 01/24/15   01/26/15, MD  tamsulosin [facility]) 0.4 MG CAPS capsule Take 1 capsule (0.4 mg total) by mouth daily after breakfast. Patient not taking: Reported on 03/10/2017 02/19/16   02/21/16, MD     Critical care time:  40 mins        CRITICAL CARE Performed by: Bethann Berkshire Rodarius Kichline   Total critical care time: 40 minutes  Critical care time was exclusive of separately billable procedures and treating other patients.  Critical care was necessary to treat or prevent imminent or life-threatening deterioration.  Critical care was time spent personally by me on the following activities: development of treatment plan with patient and/or surrogate as well as nursing, discussions with consultants, evaluation of patient's response to treatment, examination of patient, obtaining history from patient or surrogate, ordering and performing treatments and interventions, ordering and review of laboratory studies, ordering and review of radiographic studies, pulse oximetry and re-evaluation of patient's condition.   Darcella Gasman Brier Reid, PA-C [place] [facility]  Pager# [phone], if no answer [phone]

## 2020-10-10 NOTE — Consult Note (Addendum)
Chief Complaint: Patient was seen in consultation today for hydronephrosis, sepsis  Referring Physician(s): Dr. Hart Robinsonsemzik  Supervising Physician: Irish LackYamagata, Glenn  [facility]  History of Present Illness: Shirley Sullivan is a 59 y.o. female with past medical history of nephrolithiasis requiring intervention with cysto/USE/stent in the past who presents to ED with afew days of suprapubic/abdominal pain and bilateral flank pain, frequency, urgency and dysuria concerning for UTI. She reports she passed a small stone on Thursday, however continued with nausea, diarrhea, and near syncopal event.  She was found to have sepsis from obstructing ureteral stone with hydronephrosis.   Patient has been NPO.   Case reviewed by Dr. Fredia SorrowYamagata who approves patient for right percutaneous nephrostomy tube.  Urology following for possible intervention if patient too unstable for PCN placement.  Assessed at bedside.  She is stable at present.  Tachycardic, on 2L [place]. Alert. Will hopefully being able to lay prone from procedure.  Past Medical History:  Diagnosis Date  . Allergy   . Anxiety   . Arthritis   . Asthma    perfumes triggers attacks. Has inhalers for rescue  . COVID-19   . Depression   . Diabetes mellitus without complication (HCC)   . History of kidney stones 1998, 2015  . Lupus [facility](HCC)     Past Surgical History:  Procedure Laterality Date  . ABDOMINAL HYSTERECTOMY  2004  . APPENDECTOMY  1992  . CYSTOSCOPY WITH RETROGRADE PYELOGRAM, URETEROSCOPY AND STENT PLACEMENT Left 08/11/2014   Procedure: CYSTOSCOPY WITH RETROGRADE PYELOGRAM, URETEROSCOPY AND STENT PLACEMENT,  DIGITAL FLEXIBLE URETEROSCOPE;  Surgeon: Heloise PurpuraLester Borden, MD;  Location: [facility]S;  Service: Urology;  Laterality: Left;  request digital flexible ureteroscope  . CYSTOSCOPY WITH RETROGRADE PYELOGRAM, URETEROSCOPY AND STENT PLACEMENT Left 08/23/2014   Procedure: CYSTOSCOPY WITH RETROGRADE PYELOGRAM, URETEROSCOPY AND  STENT EXCHANGE;  Surgeon: Heloise PurpuraLester Borden, MD;  Location: [facility];  Service: Urology;  Laterality: Left;  . HOLMIUM LASER APPLICATION Left 08/23/2014   Procedure: HOLMIUM LASER APPLICATION;  Surgeon: Heloise PurpuraLester Borden, MD;  Location: [facility];  Service: Urology;  Laterality: Left;  . ROTATOR CUFF REPAIR Right 2007    Allergies: Codeine, Strawberry extract, and Vibramycin [doxycycline calcium]  Medications: Prior to Admission medications   Medication Sig Start Date End Date Taking? Authorizing Provider  albuterol (PROVENTIL HFA;VENTOLIN HFA) 108 (90 BASE) MCG/ACT inhaler Inhale 1 puff into the lungs every 4 (four) hours as needed for wheezing or shortness of breath. 01/20/15  Yes Andrena Mewsigby, Michael D, DO  ALPRAZolam Prudy Feeler(XANAX) 0.5 MG tablet Take 0.5 mg by mouth 2 (two) times daily as needed for anxiety.    Yes [provider]  aspirin EC 81 MG tablet Take 81 mg by mouth daily as needed (chest pain).    Yes [provider]  cetirizine (ZYRTEC) 10 MG tablet Take 10 mg by mouth at bedtime.    Yes [provider]  gabapentin (NEURONTIN) 300 MG capsule Take 300 mg by mouth 2 (two) times daily.  09/23/20  Yes [provider]  ibuprofen (ADVIL,MOTRIN) 200 MG tablet Take 400 mg by mouth every 6 (six) hours as needed for moderate pain (right knee pain).    Yes [provider]  sertraline (ZOLOFT) 100 MG tablet Take 150 mg by mouth at bedtime.    Yes [provider]  benzonatate (TESSALON) 100 MG capsule Take 1 capsule (100 mg total) by mouth 2 (two) times daily as needed for cough. Patient not taking: Reported on 02/19/2016  01/20/15   Andrena Mews, DO  HYDROcodone-acetaminophen (NORCO/VICODIN) 5-325 MG per tablet Take 1-2 tablets by mouth every 4 (four) hours as needed. Patient not taking: Reported on 02/19/2016 01/24/15   Linwood Dibbles, MD  ondansetron (ZOFRAN ODT) 4 MG disintegrating tablet 4mg  ODT q4 hours prn nausea/vomit Patient not taking: Reported on 03/10/2017  02/19/16   02/21/16, MD  oxyCODONE-acetaminophen (PERCOCET) 5-325 MG tablet Take 2 tablets by mouth every 4 (four) hours as needed. Patient not taking: Reported on 03/10/2017 02/19/16   02/21/16, MD  pantoprazole (PROTONIX) 20 MG tablet Take 1 tablet (20 mg total) by mouth daily. Patient not taking: Reported on 02/19/2016 01/24/15   01/26/15, MD  tamsulosin [facility]) 0.4 MG CAPS capsule Take 1 capsule (0.4 mg total) by mouth daily after breakfast. Patient not taking: Reported on 03/10/2017 02/19/16   02/21/16, MD     Family History  Problem Relation Age of Onset  . Hyperlipidemia Mother     Social History   Socioeconomic History  . Marital status: Married    Spouse name: Not on file  . Number of children: Not on file  . Years of education: Not on file  . Highest education level: Not on file  Occupational History  . Not on file  Tobacco Use  . Smoking status: Never Smoker  . Smokeless tobacco: Never Used  Substance and Sexual Activity  . Alcohol use: No    Alcohol/week: 0.0 standard drinks  . Drug use: No  . Sexual activity: Yes  Other Topics Concern  . Not on file  Social History Narrative  . Not on file   Social Determinants of Health   Financial Resource Strain:   . Difficulty of Paying Living Expenses: Not on file  Food Insecurity:   . Worried About [employer] in the Last Year: Not on file  . Ran Out of Food in the Last Year: Not on file  Transportation Needs:   . Lack of Transportation (Medical): Not on file  . Lack of Transportation (Non-Medical): Not on file  Physical Activity:   . Days of Exercise per Week: Not on file  . Minutes of Exercise per Session: Not on file  Stress:   . Feeling of Stress : Not on file  Social Connections:   . Frequency of Communication with Friends and Family: Not on file  . Frequency of Social Gatherings with Friends and Family: Not on file  . Attends Religious Services: Not on file  . Active Member of Clubs  or Organizations: Not on file  . Attends Programme researcher, broadcasting/film/video Meetings: Not on file  . Marital Status: Not on file     Review of Systems: A 12 point ROS discussed and pertinent positives are indicated in the HPI above.  All other systems are negative.  Review of Systems  Constitutional: Positive for fatigue and fever.  Respiratory: Positive for shortness of breath. Negative for cough.   Cardiovascular: Negative for chest pain.  Gastrointestinal: Positive for diarrhea and nausea. Negative for abdominal pain.  Genitourinary: Positive for flank pain.  Musculoskeletal: Positive for back pain.  Psychiatric/Behavioral: Negative for behavioral problems and confusion.    Vital Signs: BP (!) 88/64   Pulse (!) 104   Temp 97.7 F (36.5 C) (Oral)   Resp (!) 22   Ht 5\' 4"  (1.626 m)   Wt 195 lb (88.5 kg)   SpO2 96%   BMI 33.47 kg/m   Physical Exam Vitals and  nursing note reviewed.  Constitutional:      Appearance: Normal appearance.  Cardiovascular:     Rate and Rhythm: Regular rhythm. Tachycardia present.  Pulmonary:     Effort: Pulmonary effort is normal.     Breath sounds: Normal breath sounds.     Comments: On 2L [place] Abdominal:     General: Abdomen is flat. There is no distension.     Palpations: Abdomen is soft.  Skin:    General: Skin is warm and dry.  Neurological:     General: No focal deficit present.     Mental Status: She is alert and oriented to person, place, and time.  Psychiatric:        Mood and Affect: Mood normal.        Behavior: Behavior normal.        Thought Content: Thought content normal.        Judgment: Judgment normal.      MD Evaluation Airway: WNL Heart: WNL Abdomen: WNL Chest/ Lungs: WNL ASA  Classification: 3 Mallampati/Airway Score: One   Imaging: DG Chest Port 1 View  Result Date: 10/09/2020 CLINICAL DATA:  Questionable sepsis EXAM: PORTABLE CHEST 1 VIEW COMPARISON:  March 10, 2017 FINDINGS: The heart size and mediastinal contours  are within normal limits. There is mild prominence of the central pulmonary vasculature. The visualized skeletal structures are unremarkable. IMPRESSION: Mild pulmonary vascular congestion Electronically Signed   By: Jonna Clark M.D.   On: 10/09/2020 18:36   CT Angio Chest/Abd/Pel for Dissection W and/or W/WO  Result Date: 10/09/2020 CLINICAL DATA:  Chest pain.  Concern for aortic dissection. EXAM: CT ANGIOGRAPHY CHEST, ABDOMEN AND PELVIS TECHNIQUE: Non-contrast CT of the chest was initially obtained. Multidetector CT imaging through the chest, abdomen and pelvis was performed using the standard protocol during bolus administration of intravenous contrast. Multiplanar reconstructed images and MIPs were obtained and reviewed to evaluate the vascular anatomy. CONTRAST:  OMNIPAQUE IOHEXOL 350 MG/ML SOLN COMPARISON:  CT dated February 19, 2016. FINDINGS: CTA CHEST FINDINGS Cardiovascular: There is no evidence for a thoracic aortic dissection or aneurysm. Mild atherosclerotic changes are noted. There is no significant pericardial effusion. The heart size is normal. There is no large centrally located pulmonary embolism. Mediastinum/Nodes: --mild mediastinal adenopathy is noted. --mild hilar adenopathy is noted. -- No axillary lymphadenopathy. -- No supraclavicular lymphadenopathy. -- Normal thyroid gland where visualized. -  Unremarkable esophagus. Lungs/Pleura: Evaluation of the lung fields is limited by respiratory motion artifact. There are streaky, linear airspace opacities throughout both lung fields without evidence for large focal infiltrate. There is atelectasis at the lung bases. There is no pneumothorax. No large pleural effusion. Musculoskeletal: No chest wall abnormality. No bony spinal canal stenosis. Review of the MIP images confirms the above findings. CTA ABDOMEN AND PELVIS FINDINGS VASCULAR Aorta: Normal caliber aorta without aneurysm, dissection, vasculitis or significant stenosis. Celiac:  Patent without evidence of aneurysm, dissection, vasculitis or significant stenosis. SMA: Patent without evidence of aneurysm, dissection, vasculitis or significant stenosis. Renals: Both renal arteries are patent without evidence of aneurysm, dissection, vasculitis, fibromuscular dysplasia or significant stenosis. IMA: Patent without evidence of aneurysm, dissection, vasculitis or significant stenosis. Inflow: Patent without evidence of aneurysm, dissection, vasculitis or significant stenosis. Veins: No obvious venous abnormality within the limitations of this arterial phase study. Review of the MIP images confirms the above findings. NON-VASCULAR Hepatobiliary: The liver is enlarged. The caudate lobe is enlarged. Liver surface appears nodular. Normal gallbladder.There is no biliary ductal dilation. Pancreas:  Normal contours without ductal dilatation. No peripancreatic fluid collection. Spleen: The spleen is enlarged measuring approximately 15 cm craniocaudad. Adrenals/Urinary Tract: --Adrenal glands: Unremarkable. --Right kidney/ureter: There is mild right-sided hydroureteronephrosis secondary to an obstructing 6 mm stone in the proximal right ureter (axial series 6, image 190). --Left kidney/ureter: No hydronephrosis or radiopaque kidney stones. --Urinary bladder: Unremarkable. Stomach/Bowel: --Stomach/Duodenum: There is a gastric band in place. --Small bowel: Unremarkable. --Colon: Rectosigmoid diverticulosis without acute inflammation. --Appendix: The patient appears to be status post prior appendectomy. Lymphatic: --No retroperitoneal lymphadenopathy. --No mesenteric lymphadenopathy. --No pelvic or inguinal lymphadenopathy. Reproductive: Status post hysterectomy. No adnexal mass. Other: No ascites or free air. There is a fat containing umbilical hernia. Musculoskeletal. No acute displaced fractures. Review of the MIP images confirms the above findings. IMPRESSION: 1. No evidence for aortic dissection or  aneurysm. 2. Mild right-sided hydroureteronephrosis secondary to an obstructing 6 mm stone in the proximal right ureter. 3. Streaky, linear airspace opacities throughout both lung fields favored to represent atelectasis. 4. 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 5. There is a gastric band in place. 6. Rectosigmoid diverticulosis without acute inflammation. Aortic Atherosclerosis (ICD10-I70.0). Electronically Signed   By: Katherine Mantle M.D.   On: 10/09/2020 20:01    Labs:  CBC: Recent Labs    10/09/20 1750 10/10/20 0133  WBC 6.6 14.6*  HGB 12.5 12.1  HCT 39.1 37.5  PLT 42* 43*    COAGS: Recent Labs    10/09/20 1750 10/10/20 0133  INR 1.5* 1.4*  APTT 37*  --     BMP: Recent Labs    10/09/20 1958 10/10/20 0429  NA 136 139  K 4.0 4.9  CL 103 104  CO2 15* 13*  GLUCOSE 110* 120*  BUN 38* 44*  CALCIUM 8.0* 8.4*  CREATININE 3.49* 3.25*  GFRNONAA 14* 16*    LIVER FUNCTION TESTS: Recent Labs    10/09/20 1958 10/10/20 0429  BILITOT 0.8 1.0  AST 43* 49*  ALT 30 33  ALKPHOS 51 65  PROT 5.4* 6.1*  ALBUMIN 2.3* 2.6*    TUMOR MARKERS: No results for input(s): AFPTM, CEA, CA199, CHROMGRNA in the last 8760 hours.  Assessment and Plan: Sepsis secondary to obstructive renal calculi, right hydronephrosis Patient admitted with sepsis, obstructing right kidney stone.  Tachycardic, but afebrile.  Lactic acid 5.3 WBC 14.6 On 2L of O2-- states she was using intermittently at home during COVID recovery.  Alert, oriented.  Case reviewed and approved by Dr. Fredia Sorrow.  Patient agreeable.   Risks and benefits of right PCN placement was discussed with the patient including, but not limited to, infection, bleeding, significant bleeding causing loss or decrease in renal function or damage to adjacent structures.   All of the patient's questions were answered, patient is agreeable to proceed.  Consent signed and in chart.  Thank you  for this interesting consult.  I greatly enjoyed meeting BERNIECE ABID and look forward to participating in their care.  A copy of this report was sent to the requesting provider on this date.  Electronically Signed: Hoyt Koch, PA 10/10/2020, 12:43 PM   I spent a total of 40 Minutes    in face to face in clinical consultation, greater than 50% of which was counseling/coordinating care for hydronephrosis.

## 2020-10-10 NOTE — Progress Notes (Signed)
Pharmacy Antibiotic Note  Shirley Sullivan is a 59 y.o. female admitted on 10/09/2020 with likely urosepesis, bacteremia and an obstructing R ureteral stone with mild hydronephrosis.  WBC 14.6, Tmax 99.1 F, Scr 3.25 (baseline ~0.5), CrCl 19.6 mL/min.   The patient presented with flank/abdominal pain, intermittent chest pain, nausea, vomiting, decreased appetite and decreased urine output. The patient was started on ceftriaxone and received two doses. The patient was then found to have a R proximal obstructing ureteral stone with mild hydronephrosis on CTA. The sepsis protocol has been initiated and the patient has been requiring LR and norepinephrine for pressor support.   Blood cultures have now resulted with gram negative rods in 3/4 bottles, therefore, coverage will be broadened. Pharmacy has now been consulted for cefepime dosing.  Plan: Initiate cefepime IV 2 g q24h Discontinue ceftriaxone Monitor renal function, urine output and clinical status Deescalate therapy as clinically indicated   Height: 5\' 4"  (162.6 cm) Weight: 88.5 kg (195 lb) IBW/kg (Calculated) : 54.7  Temp (24hrs), Avg:98.3 F (36.8 C), Min:97.5 F (36.4 C), Max:99.1 F (37.3 C)  Recent Labs  Lab 10/09/20 1750 10/09/20 1958 10/10/20 0126 10/10/20 0133 10/10/20 0428 10/10/20 0429  WBC 6.6  --   --  14.6*  --   --   CREATININE  --  3.49*  --   --   --  3.25*  LATICACIDVEN 9.6* 7.7* 6.5*  --  5.5*  --     Estimated Creatinine Clearance: 19.6 mL/min (A) (by C-G formula based on SCr of 3.25 mg/dL (H)).    Allergies  Allergen Reactions  . Codeine Itching  . Strawberry Extract Hives, Itching and Other (See Comments)    Can't breathe  . Vibramycin [Doxycycline Calcium] Nausea And Vomiting    Antimicrobials this admission: 11/8 cefepime >> 11/7 ceftriaxone x2 doses   Dose adjustments this admission: None   Microbiology results: 11/8 MRSA PCR negative 11/7 UCx 11/7 BCx gram negative rods 11/7 COVID +  (d/t past infection)  Thank you for allowing pharmacy to be a part of this patient's care.  13/7, PharmD, RPh  PGY-1 Pharmacy Resident 10/10/2020 9:00 AM  Please check [email or web address] for unit-specific pharmacy phone numbers.

## 2020-10-10 NOTE — Progress Notes (Signed)
eLink Physician-Brief Progress Note Patient Name: Shirley Sullivan DOB: [birth year] MRN: [id number]   Date of Service  10/10/2020  HPI/Events of Note  Patient had a Foley catheter inserted in the ED and needs an order for it.  eICU Interventions  Foley ordered.        Thomasene Lot Tavien Chestnut 10/10/2020, 3:38 AM

## 2020-10-11 DIAGNOSIS — N179 Acute kidney failure, unspecified: Secondary | ICD-10-CM

## 2020-10-11 DIAGNOSIS — N1330 Unspecified hydronephrosis: Secondary | ICD-10-CM

## 2020-10-11 LAB — COMPREHENSIVE METABOLIC PANEL
ALT: 30 U/L (ref 0–44)
AST: 39 U/L (ref 15–41)
Albumin: 2.4 g/dL — ABNORMAL LOW (ref 3.5–5.0)
Alkaline Phosphatase: 158 U/L — ABNORMAL HIGH (ref 38–126)
Anion gap: 17 — ABNORMAL HIGH (ref 5–15)
BUN: 57 mg/dL — ABNORMAL HIGH (ref 8–23)
CO2: 19 mmol/L — ABNORMAL LOW (ref 22–32)
Calcium: 8.1 mg/dL — ABNORMAL LOW (ref 8.9–10.3)
Chloride: 103 mmol/L (ref 98–111)
Creatinine, Ser: 2.95 mg/dL — ABNORMAL HIGH (ref 0.44–1.00)
GFR, Estimated: 18 mL/min — ABNORMAL LOW (ref >60)
Glucose, Bld: 156 mg/dL — ABNORMAL HIGH (ref 70–99)
Potassium: 5.1 mmol/L (ref 3.5–5.1)
Sodium: 139 mmol/L (ref 135–145)
Total Bilirubin: 1.1 mg/dL (ref 0.3–1.2)
Total Protein: 6.1 g/dL — ABNORMAL LOW (ref 6.5–8.1)

## 2020-10-11 LAB — AMMONIA: Ammonia: 32 umol/L (ref 9–35)

## 2020-10-11 LAB — GLUCOSE, CAPILLARY
Glucose-Capillary: 136 mg/dL — ABNORMAL HIGH (ref 70–99)
Glucose-Capillary: 140 mg/dL — ABNORMAL HIGH (ref 70–99)
Glucose-Capillary: 150 mg/dL — ABNORMAL HIGH (ref 70–99)
Glucose-Capillary: 152 mg/dL — ABNORMAL HIGH (ref 70–99)
Glucose-Capillary: 162 mg/dL — ABNORMAL HIGH (ref 70–99)
Glucose-Capillary: 227 mg/dL — ABNORMAL HIGH (ref 70–99)

## 2020-10-11 LAB — CBC
HCT: 38 % (ref 36.0–46.0)
Hemoglobin: 12.3 g/dL (ref 12.0–15.0)
MCH: 28.3 pg (ref 26.0–34.0)
MCHC: 32.4 g/dL (ref 30.0–36.0)
MCV: 87.4 fL (ref 80.0–100.0)
Platelets: 32 10*3/uL — ABNORMAL LOW (ref 150–400)
RBC: 4.35 MIL/uL (ref 3.87–5.11)
RDW: 15.9 % — ABNORMAL HIGH (ref 11.5–15.5)
WBC: 11.6 10*3/uL — ABNORMAL HIGH (ref 4.0–10.5)
nRBC: 0 % (ref 0.0–0.2)

## 2020-10-11 LAB — TROPONIN I (HIGH SENSITIVITY): Troponin I (High Sensitivity): 898 ng/L (ref <18)

## 2020-10-11 LAB — LACTIC ACID, PLASMA: Lactic Acid, Venous: 5 mmol/L (ref 0.5–1.9)

## 2020-10-11 LAB — MAGNESIUM: Magnesium: 1.6 mg/dL — ABNORMAL LOW (ref 1.7–2.4)

## 2020-10-11 MED ORDER — MAGNESIUM SULFATE 2 GM/50ML IV SOLN
2.0000 g | Freq: Once | INTRAVENOUS | Status: AC
  Administered 2020-10-11: 2 g via INTRAVENOUS
  Filled 2020-10-11: qty 50

## 2020-10-11 MED ORDER — MORPHINE SULFATE (PF) 2 MG/ML IV SOLN: 1.0000 mg | INTRAVENOUS | Status: DC | PRN

## 2020-10-11 MED ORDER — HYDROMORPHONE HCL 1 MG/ML PO LIQD: 0.5000 mg | ORAL | Status: DC | PRN

## 2020-10-11 MED ORDER — OXYCODONE HCL 5 MG PO TABS
5.0000 mg | ORAL_TABLET | ORAL | Status: DC | PRN
  Administered 2020-10-11 – 2020-10-12 (×2): 5 mg via ORAL
  Filled 2020-10-11 (×2): qty 1

## 2020-10-11 MED ORDER — INSULIN ASPART 100 UNIT/ML ~~LOC~~ SOLN
0.0000 [IU] | SUBCUTANEOUS | Status: DC
  Administered 2020-10-11: 3 [IU] via SUBCUTANEOUS
  Administered 2020-10-11 – 2020-10-12 (×4): 1 [IU] via SUBCUTANEOUS
  Administered 2020-10-12: 2 [IU] via SUBCUTANEOUS
  Administered 2020-10-12 – 2020-10-13 (×2): 1 [IU] via SUBCUTANEOUS

## 2020-10-11 MED ORDER — ONDANSETRON HCL 4 MG PO TABS
4.0000 mg | ORAL_TABLET | Freq: Three times a day (TID) | ORAL | Status: DC | PRN
  Administered 2020-10-16: 4 mg via ORAL
  Filled 2020-10-11: qty 1

## 2020-10-11 NOTE — Progress Notes (Signed)
Referring Physician(s): Dr. Hart Robinsonsemzik  Supervising Physician: Malachy MoanMcCullough, Heath  Patient Status:  [facility]  Chief Complaint:  Left sided hydroureteronephrosis with AKI and sepsis s/p left nephrostomy tube placed on 11.8.21 by Dr. Fredia SorrowYamagata  Subjective: Patient  laying in bed, calm. Family at bedside. Patient appears slightly disoriented but states that she is feeling better.    Allergies: Codeine, Strawberry extract, and Vibramycin [doxycycline calcium]  Medications: Prior to Admission medications   Medication Sig Start Date End Date Taking? Authorizing Provider  albuterol (PROVENTIL HFA;VENTOLIN HFA) 108 (90 BASE) MCG/ACT inhaler Inhale 1 puff into the lungs every 4 (four) hours as needed for wheezing or shortness of breath. 01/20/15  Yes Andrena Mewsigby, Michael D, DO  ALPRAZolam Prudy Feeler(XANAX) 0.5 MG tablet Take 0.5 mg by mouth 2 (two) times daily as needed for anxiety.    Yes [provider]  aspirin EC 81 MG tablet Take 81 mg by mouth daily as needed (chest pain).    Yes [provider]  cetirizine (ZYRTEC) 10 MG tablet Take 10 mg by mouth at bedtime.    Yes [provider]  gabapentin (NEURONTIN) 300 MG capsule Take 300 mg by mouth 2 (two) times daily.  09/23/20  Yes [provider]  ibuprofen (ADVIL,MOTRIN) 200 MG tablet Take 400 mg by mouth every 6 (six) hours as needed for moderate pain (right knee pain).    Yes [provider]  sertraline (ZOLOFT) 100 MG tablet Take 150 mg by mouth at bedtime.    Yes [provider]  benzonatate (TESSALON) 100 MG capsule Take 1 capsule (100 mg total) by mouth 2 (two) times daily as needed for cough. Patient not taking: Reported on 02/19/2016 01/20/15   Andrena Mewsigby, Michael D, DO  HYDROcodone-acetaminophen (NORCO/VICODIN) 5-325 MG per tablet Take 1-2 tablets by mouth every 4 (four) hours as needed. Patient not taking: Reported on 02/19/2016 01/24/15   Linwood DibblesKnapp, Jon, MD  ondansetron (ZOFRAN ODT) 4 MG disintegrating  tablet 4mg  ODT q4 hours prn nausea/vomit Patient not taking: Reported on 03/10/2017 02/19/16   Bethann BerkshireZammit, Joseph, MD  oxyCODONE-acetaminophen (PERCOCET) 5-325 MG tablet Take 2 tablets by mouth every 4 (four) hours as needed. Patient not taking: Reported on 03/10/2017 02/19/16   Bethann BerkshireZammit, Joseph, MD  pantoprazole (PROTONIX) 20 MG tablet Take 1 tablet (20 mg total) by mouth daily. Patient not taking: Reported on 02/19/2016 01/24/15   Linwood DibblesKnapp, Jon, MD  tamsulosin [facility](FLOMAX) 0.4 MG CAPS capsule Take 1 capsule (0.4 mg total) by mouth daily after breakfast. Patient not taking: Reported on 03/10/2017 02/19/16   Bethann BerkshireZammit, Joseph, MD     Vital Signs: BP 103/64   Pulse 96   Temp 97.7 F (36.5 C) (Oral)   Resp (!) 21   Ht 5\' 4"  (1.626 m)   Wt 195 lb (88.5 kg)   SpO2 97%   BMI 33.47 kg/m   Physical Exam Vitals and nursing note reviewed.  Constitutional:      Appearance: She is well-developed.  HENT:     Head: Normocephalic and atraumatic.  Eyes:     Conjunctiva/sclera: Conjunctivae normal.  Pulmonary:     Effort: Pulmonary effort is normal.  Genitourinary:    Comments: Positive left sided nephrostomy tube  to  gravity bag. site is unremarkable with no erythema, edema, tenderness, bleeding or drainage noted at exit site. Suture and stat lock in place. Dressing is clean dry and intact. 10 ml of  seosanginous colored fluid noted in gravity bag,   Musculoskeletal:  Cervical back: Normal range of motion.  Neurological:     Mental Status: She is alert and oriented to person, place, and time.     Imaging: DG Chest Port 1 View  Result Date: 10/09/2020 CLINICAL DATA:  Questionable sepsis EXAM: PORTABLE CHEST 1 VIEW COMPARISON:  March 10, 2017 FINDINGS: The heart size and mediastinal contours are within normal limits. There is mild prominence of the central pulmonary vasculature. The visualized skeletal structures are unremarkable. IMPRESSION: Mild pulmonary vascular congestion Electronically Signed   By: Jonna Clark M.D.   On: 10/09/2020 18:36   ECHOCARDIOGRAM COMPLETE  Result Date: 10/10/2020    ECHOCARDIOGRAM REPORT   Patient Name:   Shirley Sullivan Date of Exam: 10/10/2020 Medical Rec #:  [id number]     Height:       64.0 in Accession #:    [id number]    Weight:       195.0 lb Date of Birth:  20-May-1961      BSA:          1.935 m Patient Age:    59 years      BP:           106/77 mmHg Patient Gender: F             HR:           105 bpm. Exam Location:  Inpatient Procedure: 2D Echo, Color Doppler, Cardiac Doppler and Intracardiac            Opacification Agent Indications:    Septic Shock  History:        Patient has no prior history of Echocardiogram examinations.                 Risk Factors:Diabetes and COVID+ 08/2020.  Sonographer:    Irving Burton Senior RDCS Referring Phys: [phone] PAULA B SIMPSON IMPRESSIONS  1. Left ventricular ejection fraction, by estimation, is 55 to 60%. The left ventricle has normal function. The left ventricle has no regional wall motion abnormalities. Left ventricular diastolic parameters were normal.  2. Right ventricular systolic function is normal. The right ventricular size is normal. There is mildly elevated pulmonary artery systolic pressure.  3. The mitral valve is normal in structure. No evidence of mitral valve regurgitation. No evidence of mitral stenosis.  4. The aortic valve is normal in structure. Aortic valve regurgitation is not visualized. Mild to moderate aortic valve sclerosis/calcification is present, without any evidence of aortic stenosis.  5. The inferior vena cava is normal in size with greater than 50% respiratory variability, suggesting right atrial pressure of 3 mmHg. FINDINGS  Left Ventricle: Left ventricular ejection fraction, by estimation, is 55 to 60%. The left ventricle has normal function. The left ventricle has no regional wall motion abnormalities. Definity contrast agent was given IV to delineate the left ventricular  endocardial borders. The left ventricular  internal cavity size was normal in size. There is no left ventricular hypertrophy. Left ventricular diastolic parameters were normal. Right Ventricle: The right ventricular size is normal. No increase in right ventricular wall thickness. Right ventricular systolic function is normal. There is mildly elevated pulmonary artery systolic pressure. The tricuspid regurgitant velocity is 2.39  m/s, and with an assumed right atrial pressure of 15 mmHg, the estimated right ventricular systolic pressure is 37.8 mmHg. Left Atrium: Left atrial size was normal in size. Right Atrium: Right atrial size was normal in size. Pericardium: There is no evidence of pericardial effusion. Mitral Valve: The mitral  valve is normal in structure. There is moderate thickening of the mitral valve leaflet(s). There is moderate calcification of the mitral valve leaflet(s). No evidence of mitral valve regurgitation. No evidence of mitral valve stenosis. Tricuspid Valve: The tricuspid valve is normal in structure. Tricuspid valve regurgitation is trivial. No evidence of tricuspid stenosis. Aortic Valve: The aortic valve is normal in structure. Aortic valve regurgitation is not visualized. Mild to moderate aortic valve sclerosis/calcification is present, without any evidence of aortic stenosis. Pulmonic Valve: The pulmonic valve was normal in structure. Pulmonic valve regurgitation is not visualized. No evidence of pulmonic stenosis. Aorta: The aortic root is normal in size and structure. Venous: The inferior vena cava is normal in size with greater than 50% respiratory variability, suggesting right atrial pressure of 3 mmHg. IAS/Shunts: No atrial level shunt detected by color flow Doppler.  LEFT VENTRICLE PLAX 2D LVIDd:         3.80 cm  Diastology LVIDs:         2.80 cm  LV e' medial:    5.22 cm/s LV PW:         1.10 cm  LV E/e' medial:  11.3 LV IVS:        1.10 cm  LV e' lateral:   6.31 cm/s LVOT diam:     2.00 cm  LV E/e' lateral: 9.4 LV SV:          41 LV SV Index:   21 LVOT Area:     3.14 cm  RIGHT VENTRICLE             IVC RV S prime:     10.00 cm/s  IVC diam: 2.75 cm TAPSE (M-mode): 2.3 cm LEFT ATRIUM           Index       RIGHT ATRIUM           Index LA diam:      3.40 cm 1.76 cm/m  RA Area:     17.90 cm LA Vol (A2C): 45.5 ml 23.51 ml/m RA Volume:   52.90 ml  27.33 ml/m LA Vol (A4C): 62.1 ml 32.09 ml/m  AORTIC VALVE LVOT Vmax:   73.10 cm/s LVOT Vmean:  59.100 cm/s LVOT VTI:    0.130 m  AORTA Ao Root diam: 3.10 cm Ao Asc diam:  3.10 cm MITRAL VALVE               TRICUSPID VALVE MV Area (PHT): 3.19 cm    TR Peak grad:   22.8 mmHg MV Decel Time: 238 msec    TR Vmax:        239.00 cm/s MV E velocity: 59.10 cm/s MV A velocity: 60.80 cm/s  SHUNTS MV E/A ratio:  0.97        Systemic VTI:  0.13 m                            Systemic Diam: 2.00 cm Charlton Haws MD Electronically signed by Charlton Haws MD Signature Date/Time: 10/10/2020/1:14:53 PM    Final    CT Angio Chest/Abd/Pel for Dissection W and/or W/WO  Result Date: 10/09/2020 CLINICAL DATA:  Chest pain.  Concern for aortic dissection. EXAM: CT ANGIOGRAPHY CHEST, ABDOMEN AND PELVIS TECHNIQUE: Non-contrast CT of the chest was initially obtained. Multidetector CT imaging through the chest, abdomen and pelvis was performed using the standard protocol during bolus administration of intravenous contrast. Multiplanar reconstructed images and MIPs were obtained  and reviewed to evaluate the vascular anatomy. CONTRAST:  OMNIPAQUE IOHEXOL 350 MG/ML SOLN COMPARISON:  CT dated February 19, 2016. FINDINGS: CTA CHEST FINDINGS Cardiovascular: There is no evidence for a thoracic aortic dissection or aneurysm. Mild atherosclerotic changes are noted. There is no significant pericardial effusion. The heart size is normal. There is no large centrally located pulmonary embolism. Mediastinum/Nodes: --mild mediastinal adenopathy is noted. --mild hilar adenopathy is noted. -- No axillary lymphadenopathy. -- No  supraclavicular lymphadenopathy. -- Normal thyroid gland where visualized. -  Unremarkable esophagus. Lungs/Pleura: Evaluation of the lung fields is limited by respiratory motion artifact. There are streaky, linear airspace opacities throughout both lung fields without evidence for large focal infiltrate. There is atelectasis at the lung bases. There is no pneumothorax. No large pleural effusion. Musculoskeletal: No chest wall abnormality. No bony spinal canal stenosis. Review of the MIP images confirms the above findings. CTA ABDOMEN AND PELVIS FINDINGS VASCULAR Aorta: Normal caliber aorta without aneurysm, dissection, vasculitis or significant stenosis. Celiac: Patent without evidence of aneurysm, dissection, vasculitis or significant stenosis. SMA: Patent without evidence of aneurysm, dissection, vasculitis or significant stenosis. Renals: Both renal arteries are patent without evidence of aneurysm, dissection, vasculitis, fibromuscular dysplasia or significant stenosis. IMA: Patent without evidence of aneurysm, dissection, vasculitis or significant stenosis. Inflow: Patent without evidence of aneurysm, dissection, vasculitis or significant stenosis. Veins: No obvious venous abnormality within the limitations of this arterial phase study. Review of the MIP images confirms the above findings. NON-VASCULAR Hepatobiliary: The liver is enlarged. The caudate lobe is enlarged. Liver surface appears nodular. Normal gallbladder.There is no biliary ductal dilation. Pancreas: Normal contours without ductal dilatation. No peripancreatic fluid collection. Spleen: The spleen is enlarged measuring approximately 15 cm craniocaudad. Adrenals/Urinary Tract: --Adrenal glands: Unremarkable. --Right kidney/ureter: There is mild right-sided hydroureteronephrosis secondary to an obstructing 6 mm stone in the proximal right ureter (axial series 6, image 190). --Left kidney/ureter: No hydronephrosis or radiopaque kidney stones.  --Urinary bladder: Unremarkable. Stomach/Bowel: --Stomach/Duodenum: There is a gastric band in place. --Small bowel: Unremarkable. --Colon: Rectosigmoid diverticulosis without acute inflammation. --Appendix: The patient appears to be status post prior appendectomy. Lymphatic: --No retroperitoneal lymphadenopathy. --No mesenteric lymphadenopathy. --No pelvic or inguinal lymphadenopathy. Reproductive: Status post hysterectomy. No adnexal mass. Other: No ascites or free air. There is a fat containing umbilical hernia. Musculoskeletal. No acute displaced fractures. Review of the MIP images confirms the above findings. IMPRESSION: 1. No evidence for aortic dissection or aneurysm. 2. Mild right-sided hydroureteronephrosis secondary to an obstructing 6 mm stone in the proximal right ureter. 3. Streaky, linear airspace opacities throughout both lung fields favored to represent atelectasis. 4. 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 5. There is a gastric band in place. 6. Rectosigmoid diverticulosis without acute inflammation. Aortic Atherosclerosis (ICD10-I70.0). Electronically Signed   By: Katherine Mantle M.D.   On: 10/09/2020 20:01   IR NEPHROSTOMY PLACEMENT RIGHT  Result Date: 10/10/2020 CLINICAL DATA:  Obstructing proximal right ureteral calculus and sepsis. The patient presents for right percutaneous nephrostomy tube placement for right renal decompression and treatment of presumed sepsis source. EXAM: 1. ULTRASOUND GUIDANCE FOR PUNCTURE OF THE RIGHT RENAL COLLECTING SYSTEM. 2. LEFT PERCUTANEOUS NEPHROSTOMY TUBE PLACEMENT. COMPARISON:  CTA on 10/09/2020 ANESTHESIA/SEDATION: 0.5 mg IV Versed; 75 mcg IV Fentanyl. Total Moderate Sedation Time 34 minutes. The patient's level of consciousness and physiologic status were continuously monitored during the procedure by Radiology nursing. CONTRAST:  8 ml Omnipaque 300 MEDICATIONS: 2 g IV Ancef. Antibiotic was administered in an  appropriate time frame prior to skin puncture. FLUOROSCOPY TIME:  9 minutes and 48 seconds. 135 mGy. PROCEDURE: The procedure, risks, benefits, and alternatives were explained to the patient. Questions regarding the procedure were encouraged and answered. The patient understands and consents to the procedure. A time-out was performed prior to initiating the procedure. Ultrasound was used to localize the right kidney. The right flank region was prepped with chlorhexidine in a sterile fashion, and a sterile drape was applied covering the operative field. A sterile gown and sterile gloves were used for the procedure. Local anesthesia was provided with 1% Lidocaine. Ultrasound was used to localize the right kidney. Under direct ultrasound guidance, a 21 gauge needle was advanced into the renal collecting system. Ultrasound image documentation was performed. Aspiration of urine sample was performed followed by contrast injection. A transitional dilator was advanced over a guidewire. Guidewire advancement was then performed via a 5 French catheter. Percutaneous tract dilatation was then performed over the guidewire. A 10-French percutaneous nephrostomy tube was then advanced and formed in the collecting system. Catheter position was confirmed by fluoroscopy after contrast injection. The catheter was secured at the skin with a Prolene retention suture and Stat-Lock device. A gravity bag was placed. COMPLICATIONS: None. FINDINGS: Initial ultrasound demonstrates mild right-sided hydronephrosis and attempt at opacification of the collecting system under ultrasound guidance with a 21 gauge needle was unsuccessful. Under fluoroscopy, there was residual faint opacification of the collecting system from excreted contrast administered at the time of CTA yesterday. After lower pole puncture under fluoroscopy, access was gained to the collecting system allowing placement of a 5 French catheter followed by the nephrostomy tube. The  nephrostomy tube is formed in the renal pelvis. There was bleeding in the collecting system at the time of tube placement with clot present in the collecting system with contrast injection. IMPRESSION: Right-sided percutaneous nephrostomy tube placement with placement of 10 French catheter formed in the renal pelvis. There was some bleeding at the time of tube placement with clot formation in the collecting system. The tube will be flushed and irrigated. The nephrostomy tube was connected to a gravity drainage bag. Electronically Signed   By: Irish Lack M.D.   On: 10/10/2020 16:50    Labs:  CBC: Recent Labs    10/09/20 1750 10/09/20 1750 10/10/20 0133 10/10/20 1553 10/10/20 1752 10/11/20 0144  WBC 6.6  --  14.6* 2.1*  --  11.6*  HGB 12.5   < > 12.1 12.6 11.2* 12.3  HCT 39.1   < > 37.5 40.1 34.4* 38.0  PLT 42*  --  43* 34*  --  32*   < > = values in this interval not displayed.    COAGS: Recent Labs    10/09/20 1750 10/10/20 0133  INR 1.5* 1.4*  APTT 37*  --     BMP: Recent Labs    10/09/20 1958 10/10/20 0429 10/10/20 1553 10/11/20 0144  NA 136 139 137 139  K 4.0 4.9 4.7 5.1  CL 103 104 103 103  CO2 15* 13* 18* 19*  GLUCOSE 110* 120* 146* 156*  BUN 38* 44* 49* 57*  CALCIUM 8.0* 8.4* 8.2* 8.1*  CREATININE 3.49* 3.25* 2.97* 2.95*  GFRNONAA 14* 16* 17* 18*    LIVER FUNCTION TESTS: Recent Labs    10/09/20 1958 10/10/20 0429 10/11/20 0144  BILITOT 0.8 1.0 1.1  AST 43* 49* 39  ALT 30 33 30  ALKPHOS 51 65 158*  PROT 5.4* 6.1* 6.1*  ALBUMIN 2.3*  2.6* 2.4*    Assessment and Plan:  59 y.o. female inpatient. History of lupus, nephrolithiasis s/p stent (2015) presented to the ED at [facility] with abdominal pain and bilateral flank pain with dysuria and urgency. Patient reporting passing a stone on Thursday however her condition worsened with nausea, diarrhea and a near syncopal event. Patient was found to be septic with a right  Sided hydroureteronephrosis secondary  to an obstructing 6 mm stone. right ureteral calculi IR placed left sided nephrostomy tube on 11.8.21.  Per Epic output is 40 ml. 10 ml of serosanguinous fluid noted to be in the gravity bag. WBC is 11.6, Cr 2.95, BUN 57, Lactic acid is 5.0. Per EPIC output is 40 ml of serosanguinous output. 10 ml noted to be in the gravity bag. Patient seen at bedside disoriented. Family member is bedside.  IIR will continue to follow along - plans per urology. Electronically Signed: Alene Mires, NP 10/11/2020, 11:44 AM   I spent a total of 15 Minutes at the patient's bedside AND on the patient's hospital floor or unit, greater than 50% of which was counseling/coordinating care for left nephrostomy tube placement.

## 2020-10-11 NOTE — Progress Notes (Addendum)
NAME:  Shirley RochesterLynne D Riedesel, MRN:  [id number], DOB:  07/14/1961, LOS: 2 ADMISSION DATE:  10/09/2020, CONSULTATION DATE:  10/09/2020 REFERRING MD: [place] Robinson, PA, CHIEF COMPLAINT:  Septic shock  Brief History   59 year old female presenting with abdominal/ flank pain, UTI symptoms, N/V/D, and intermittent chest pain since passing a kidney stone on 11/4.  Found to be in septic shock with UTI and obstructing right ureteral stone with mild hydronephrosis with AKI requiring vasopressor support.   History of present illness   59 year old female with DM, asthma, nephrolithiasis, lupus (no flare in last 5 years/ not on home, depression/ anxiety, arthritis, and COVID-19 (positive on 08/15/2020) presenting to ER with complaints of not feeling well since Thursday.  She thinks she passed a kidney stone on the right and since has continued to fell bad with suprapubic abdominal pain, bilateral flank pain, back pain, nausea and vomiting, decreased urinary output, dysuria, some confusion, generalized weakness, diarrhea, complaints of feeling hot, intermittent midsternum chest pain, and mild shortness of breath.   In ER, she was afebrile, tachycardic 120, hypotensive 75/59, and 93% on room air.   Labs noted for normal WBC, platelets 42, lactic 9.6-> 7.7, CO2 15, glucose 110, BUN 38, sCr 3.49, AG 18, albumin 2.3, AST 43, t. Protein 5.4, BNP 1372, troponin hs 429, INR 1.5, PT 17, UA with large Hgb, trace leukocytes, positive nitrates, protein 100, high SG, many bacteria, RBC > 50, > 50 WBC. EKG unchanged from prior.  CXR showed mild pulmonary vascular congestion, and given back and chest pain with concern for dissection, CTA chest/ abd/ pelvis was negative for dissection or aneurysm, but showed an obstructing 6 mm stone in the proximal right ureter with mild right sided hydroureteronephrosis; also noted for hepatosplenomegaly with likely underlying cirrhosis and portal hypertension.  She received a total of 3L of fluids with  persistent hypotension, therefore levophed was started.  Urology and IR were consulted.  At this time, IR is not recommending percutaneous nephrostomy tube due to mild hydronephrosis and body habitus.  PCCM called for admission.   Past Medical History  DM, asthma, nephrolithiasis, lupus, depression/ anxiety, COVID 19 (positive on 08/15/2020), arthritis, previous lap band placement, Non smoker, non drinker  Significant Hospital Events   11/7 admitted  11/8 IR perc nephrostomy tube  Consults:  Urology IR   Procedures:      Significant Diagnostic Tests:  10/09/2020 CTA chest/ abd/ pelvis >> 1. No evidence for aortic dissection or aneurysm. 2. Mild right-sided hydroureteronephrosis secondary to an obstructing 6 mm stone in the proximal right ureter. 3. Streaky, linear airspace opacities throughout both lung fields favored to represent atelectasis. 4. 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 5. There is a gastric band in place. 6. Rectosigmoid diverticulosis without acute inflammation.  11/8 Echo>>EF 55-60%, There is mildly elevated pulmonary artery systolic pressure.  Micro Data:  11/7 BCx2 >>gram neg rodsx2>>Enterobacterales, Enterobacter cloacae, Klebsiella oxytoca>> 11/7 UC >>100,000 gm neg rods>> 11/7 SARS 2 >> positive, Flu >> neg  Antimicrobials:  Ceftriaxone 11/7-11/8 Cefepime 11/8-  Interim history/subjective:  Awake, confused, renal function improving Glucose 150-160 +3L  Objective   Blood pressure 116/73, pulse 94, temperature 98.8 F (37.1 C), temperature source Oral, resp. rate (!) 25, height 5\' 4"  (1.626 m), weight 88.5 kg, SpO2 98 %.        Intake/Output Summary (Last 24 hours) at 10/11/2020 0734 Last data filed at 10/11/2020 0623 Gross per 24 hour  Intake 3245.06 ml  Output 1026 ml  Net 2219.06 ml   Filed Weights   10/09/20 1733 10/11/20 0253  Weight: 88.5 kg 88.5 kg   Examination: General:  Awake, fatigued,  occasionally moaning but in no acute distress HEENT: MM pink/dry Neuro: slightly somnolent, answers questions, but disoriented.  Moving all extremities with equal strength, no facial droop or slurred speech CV: rr, ST, no murmur PULM: clear bilaterally on room air GI: obese, soft, +bs  Extremities: cool/dry, no LE edema  Skin: no rashes  Resolved Hospital Problem list    Assessment & Plan:   Septic shock and Enterobacter/Klebsiella bacteremia secondary to UTI and pyelonephritis Remains on peripheral norepi P:  -s/p percutaneous nephrostomy tube in IR yesterday -Peripheral NE for MAP goal > 65, no current need for CVC -Lactic acid down-trending -Continue Cefepime, awaiting sensitivities  -Nausea and pain control   Obstructing nephrolithiasis, 6 mm in proximal right ureter, with mild hydronephrosis S/p R nephrostomy tube P:  -bloody drainage from tube, flush to prevent clotting -will need outpatient definitive stone treatment with urology    Acute Kidney Injury Most likely post-obstruction related to obstructing nephrolithiasis, may be component of pre-renal given N/V/D.  Baseline creatinine normal three years ago P: -Creatinine slowly improving. 2.9 today -no hyperkalemia -0.5cc/kg/hr yesterday   AGMA/ lactic acidosis  s/p 3L IVF on admission Lactic down-trending P:  -likely secondary to combination of renal failure and acidosis,  -trend, treat underlying sepsis and renal obstruction  Thrombocytopenia - suspect related to gram negative bacteremia vs underlying liver disease/ hepatic congestion P:  -Trend CBC/ coags -hold Asa, minimal blood in nephrostomy tube    Acute Encephalopathy Non-focal exam, suspect secondary to gm negative sepsis and morphine. Ammonia normal P: -transition to po oxycodone as able with prn low dose Dilaudid  Elevated troponin  Chest pain resolved Echo reassuring P:  -suspect demand ischemia -holding Asa secondary to  thrombocytopenia  Hepatosplenomegaly  with possibly underlying cirrhosis and portal hypertension, denies ETOH P:  -LFT's and INR improving -Will need further GI outpatient workup  Mild hypoxemia - atelectasis noted on CTA P:  Aggressive pulmonary hygiene Supplemental O2 prn   COVID positive-  initially positive on 9/13 P:  Out of 21 day window, likely still viral shedding  DM P:  Trend CBG q 4, add SSI if need for   Best practice:  Diet: NPO Pain/Anxiety/Delirium protocol (if indicated): morphine prn VAP protocol (if indicated): n/a DVT prophylaxis: SCDs given thrombocytopenia  GI prophylaxis: n/a Glucose control: CBG q 4 Mobility: BR Code Status: full  Family Communication: patient  updated on plan of care, does not want family called 11/9 Disposition: admit to ICU   Labs   CBC: Recent Labs  Lab 10/09/20 1750 10/10/20 0133 10/10/20 1553 10/10/20 1752  WBC 6.6 14.6* 2.1*  --   NEUTROABS 5.3  --   --   --   HGB 12.5 12.1 12.6 11.2*  HCT 39.1 37.5 40.1 34.4*  MCV 87.7 85.6 87.6  --   PLT 42* 43* 34*  --     Basic Metabolic Panel: Recent Labs  Lab 10/09/20 1958 10/10/20 0429 10/10/20 1553  NA 136 139 137  K 4.0 4.9 4.7  CL 103 104 103  CO2 15* 13* 18*  GLUCOSE 110* 120* 146*  BUN 38* 44* 49*  CREATININE 3.49* 3.25* 2.97*  CALCIUM 8.0* 8.4* 8.2*   GFR: Estimated Creatinine Clearance: 21.4 mL/min (A) (by C-G formula based on SCr of 2.97 mg/dL (H)). Recent Labs  Lab 10/09/20 1750 10/09/20 1958 10/10/20 0126 10/10/20 0133 10/10/20 0428 10/10/20 1007 10/10/20 1553 10/11/20 0144  WBC 6.6  --   --  14.6*  --   --  2.1*  --   LATICACIDVEN 9.6*   < > 6.5*  --  5.5* 5.3*  --  5.0*   < > = values in this interval not displayed.    Liver Function Tests: Recent Labs  Lab 10/09/20 1958 10/10/20 0429  AST 43* 49*  ALT 30 33  ALKPHOS 51 65  BILITOT 0.8 1.0  PROT 5.4* 6.1*  ALBUMIN 2.3* 2.6*   No results for input(s): LIPASE, AMYLASE in the  last 168 hours. No results for input(s): AMMONIA in the last 168 hours.  ABG No results found for: PHART, PCO2ART, PO2ART, HCO3, TCO2, ACIDBASEDEF, O2SAT   Coagulation Profile: Recent Labs  Lab 10/09/20 1750 10/10/20 0133  INR 1.5* 1.4*    Cardiac Enzymes: No results for input(s): CKTOTAL, CKMB, CKMBINDEX, TROPONINI in the last 168 hours.  HbA1C: Hgb A1c MFr Bld  Date/Time Value Ref Range Status  10/10/2020 01:33 AM 5.8 (H) 4.8 - 5.6 % Final    Comment:    (NOTE) Pre diabetes:          5.7%-6.4%  Diabetes:              >6.4%  Glycemic control for   <7.0% adults with diabetes     CBG: Recent Labs  Lab 10/10/20 1134 10/10/20 1553 10/10/20 1950 10/10/20 2349 10/11/20 0342  GLUCAP 118* 131* 71 112* 150*    Review of Systems:   Review of Systems  Constitutional: Positive for malaise/fatigue. Negative for fever.  Respiratory: Positive for shortness of breath. Negative for cough, sputum production and wheezing.   Cardiovascular: Positive for chest pain. Negative for palpitations and leg swelling.  Gastrointestinal: Positive for abdominal pain, diarrhea, nausea and vomiting.  Genitourinary: Positive for dysuria and flank pain.  Musculoskeletal: Positive for back pain.  Skin: Negative for rash.  Neurological: Positive for weakness. Negative for focal weakness.   Past Medical History  She,  has a past medical history of Allergy, Anxiety, Arthritis, Asthma, COVID-19, Depression, Diabetes mellitus without complication (HCC), History of kidney stones (1998, 2015), and Lupus (HCC).   Surgical History    Past Surgical History:  Procedure Laterality Date  . ABDOMINAL HYSTERECTOMY  2004  . APPENDECTOMY  1992  . CYSTOSCOPY WITH RETROGRADE PYELOGRAM, URETEROSCOPY AND STENT PLACEMENT Left 08/11/2014   Procedure: CYSTOSCOPY WITH RETROGRADE PYELOGRAM, URETEROSCOPY AND STENT PLACEMENT,  DIGITAL FLEXIBLE URETEROSCOPE;  Surgeon: Heloise Purpura, MD;  Location: [facility];  Service:  Urology;  Laterality: Left;  request digital flexible ureteroscope  . CYSTOSCOPY WITH RETROGRADE PYELOGRAM, URETEROSCOPY AND STENT PLACEMENT Left 08/23/2014   Procedure: CYSTOSCOPY WITH RETROGRADE PYELOGRAM, URETEROSCOPY AND STENT EXCHANGE;  Surgeon: Heloise Purpura, MD;  Location: [facility];  Service: Urology;  Laterality: Left;  . HOLMIUM LASER APPLICATION Left 08/23/2014   Procedure: HOLMIUM LASER APPLICATION;  Surgeon: Heloise Purpura, MD;  Location: [facility];  Service: Urology;  Laterality: Left;  . IR NEPHROSTOMY PLACEMENT RIGHT  10/10/2020  . ROTATOR CUFF REPAIR Right 2007     Social History   reports that she has never smoked. She has never used smokeless tobacco. She reports that she does not drink alcohol and does not use drugs.   Family History   Her family history includes Hyperlipidemia in her mother.   Allergies Allergies  Allergen Reactions  . Codeine Itching  .  Strawberry Extract Hives, Itching and Other (See Comments)    Can't breathe  . Vibramycin [Doxycycline Calcium] Nausea And Vomiting     Home Medications  Prior to Admission medications   Medication Sig Start Date End Date Taking? Authorizing Provider  albuterol (PROVENTIL HFA;VENTOLIN HFA) 108 (90 BASE) MCG/ACT inhaler Inhale 1 puff into the lungs every 4 (four) hours as needed for wheezing or shortness of breath. 01/20/15  Yes Andrena Mews, DO  ALPRAZolam Prudy Feeler) 0.5 MG tablet Take 0.5 mg by mouth 2 (two) times daily as needed for anxiety.    Yes [provider]  aspirin EC 81 MG tablet Take 81 mg by mouth daily as needed (chest pain).    Yes [provider]  cetirizine (ZYRTEC) 10 MG tablet Take 10 mg by mouth at bedtime.    Yes [provider]  gabapentin (NEURONTIN) 300 MG capsule Take 300 mg by mouth 2 (two) times daily.  09/23/20  Yes [provider]  ibuprofen (ADVIL,MOTRIN) 200 MG tablet Take 400 mg by mouth every 6 (six) hours as needed for moderate pain (right knee pain).     Yes [provider]  sertraline (ZOLOFT) 100 MG tablet Take 150 mg by mouth at bedtime.    Yes [provider]  benzonatate (TESSALON) 100 MG capsule Take 1 capsule (100 mg total) by mouth 2 (two) times daily as needed for cough. Patient not taking: Reported on 02/19/2016 01/20/15   Andrena Mews, DO  HYDROcodone-acetaminophen (NORCO/VICODIN) 5-325 MG per tablet Take 1-2 tablets by mouth every 4 (four) hours as needed. Patient not taking: Reported on 02/19/2016 01/24/15   Linwood Dibbles, MD  ondansetron (ZOFRAN ODT) 4 MG disintegrating tablet 4mg  ODT q4 hours prn nausea/vomit Patient not taking: Reported on 03/10/2017 02/19/16   02/21/16, MD  oxyCODONE-acetaminophen (PERCOCET) 5-325 MG tablet Take 2 tablets by mouth every 4 (four) hours as needed. Patient not taking: Reported on 03/10/2017 02/19/16   02/21/16, MD  pantoprazole (PROTONIX) 20 MG tablet Take 1 tablet (20 mg total) by mouth daily. Patient not taking: Reported on 02/19/2016 01/24/15   01/26/15, MD  tamsulosin [facility]) 0.4 MG CAPS capsule Take 1 capsule (0.4 mg total) by mouth daily after breakfast. Patient not taking: Reported on 03/10/2017 02/19/16   02/21/16, MD     Critical care time:  35 mins        CRITICAL CARE Performed by: Bethann Berkshire Sadeel Fiddler   Total critical care time: 35 minutes  Critical care time was exclusive of separately billable procedures and treating other patients.  Critical care was necessary to treat or prevent imminent or life-threatening deterioration.  Critical care was time spent personally by me on the following activities: development of treatment plan with patient and/or surrogate as well as nursing, discussions with consultants, evaluation of patient's response to treatment, examination of patient, obtaining history from patient or surrogate, ordering and performing treatments and interventions, ordering and review of laboratory studies, ordering and review of radiographic  studies, pulse oximetry and re-evaluation of patient's condition.   Darcella Gasman Chauntelle Azpeitia, PA-C [place] [facility]  Pager# [phone], if no answer [phone]

## 2020-10-11 NOTE — Progress Notes (Addendum)
Subjective: Patient feeling about the same as yesterday Successful nephrostomy tube placement by VIR yesterday afternoon Minimal neph tube output 40cc, bloody Increased UOP overall, 1L over last 24hrs  Objective: Vital signs in last 24 hours: Temp:  [97.7 F (36.5 C)-99.8 F (37.7 C)] 98.8 F (37.1 C) (11/09 0349) Pulse Rate:  [94-140] 94 (11/09 0600) Resp:  [16-37] 25 (11/09 0600) BP: (85-156)/(55-97) 116/73 (11/09 0600) SpO2:  [75 %-98 %] 98 % (11/09 0600) Weight:  [88.5 kg] 88.5 kg (11/09 0253)  Intake/Output from previous day: 11/08 0701 - 11/09 0700 In: 3245.1 [I.V.:3145.1; IV Piggyback:100] Out: 1026 [Urine:1025; Stool:1] Intake/Output this shift: No intake/output data recorded.  Physical Exam:  General: Alert and oriented CV: tachycardic Lungs: slightly increased work of breathing with Mathews Abdomen: Soft, ND GU: foley in place draining yellow urine. Mild bilateral CVA tenderness, unchanged from prior. Neph tube in place with bloody output minimal Ext: NT, No erythema  Lab Results: Recent Labs    10/10/20 0133 10/10/20 1553 10/10/20 1752  HGB 12.1 12.6 11.2*  HCT 37.5 40.1 34.4*   BMET Recent Labs    10/10/20 0429 10/10/20 1553  NA 139 137  K 4.9 4.7  CL 104 103  CO2 13* 18*  GLUCOSE 120* 146*  BUN 44* 49*  CREATININE 3.25* 2.97*  CALCIUM 8.4* 8.2*     Studies/Results: DG Chest Port 1 View  Result Date: 10/09/2020 CLINICAL DATA:  Questionable sepsis EXAM: PORTABLE CHEST 1 VIEW COMPARISON:  March 10, 2017 FINDINGS: The heart size and mediastinal contours are within normal limits. There is mild prominence of the central pulmonary vasculature. The visualized skeletal structures are unremarkable. IMPRESSION: Mild pulmonary vascular congestion Electronically Signed   By: Jonna Clark M.D.   On: 10/09/2020 18:36   ECHOCARDIOGRAM COMPLETE  Result Date: 10/10/2020    ECHOCARDIOGRAM REPORT   Patient Name:   Shirley BLACKSHER Date of Exam: 10/10/2020 Medical  Rec #:  [id number]     Height:       64.0 in Accession #:    [id number]    Weight:       195.0 lb Date of Birth:  [birth year]      BSA:          1.935 m Patient Age:    59 years      BP:           106/77 mmHg Patient Gender: F             HR:           105 bpm. Exam Location:  Inpatient Procedure: 2D Echo, Color Doppler, Cardiac Doppler and Intracardiac            Opacification Agent Indications:    Septic Shock  History:        Patient has no prior history of Echocardiogram examinations.                 Risk Factors:Diabetes and COVID+ 08/2020.  Sonographer:    Irving Burton Senior RDCS Referring Phys: [phone] PAULA B SIMPSON IMPRESSIONS  1. Left ventricular ejection fraction, by estimation, is 55 to 60%. The left ventricle has normal function. The left ventricle has no regional wall motion abnormalities. Left ventricular diastolic parameters were normal.  2. Right ventricular systolic function is normal. The right ventricular size is normal. There is mildly elevated pulmonary artery systolic pressure.  3. The mitral valve is normal in structure. No evidence of mitral valve regurgitation. No evidence of mitral stenosis.  4. The aortic valve is normal in structure. Aortic valve regurgitation is not visualized. Mild to moderate aortic valve sclerosis/calcification is present, without any evidence of aortic stenosis.  5. The inferior vena cava is normal in size with greater than 50% respiratory variability, suggesting right atrial pressure of 3 mmHg. FINDINGS  Left Ventricle: Left ventricular ejection fraction, by estimation, is 55 to 60%. The left ventricle has normal function. The left ventricle has no regional wall motion abnormalities. Definity contrast agent was given IV to delineate the left ventricular  endocardial borders. The left ventricular internal cavity size was normal in size. There is no left ventricular hypertrophy. Left ventricular diastolic parameters were normal. Right Ventricle: The right ventricular size is  normal. No increase in right ventricular wall thickness. Right ventricular systolic function is normal. There is mildly elevated pulmonary artery systolic pressure. The tricuspid regurgitant velocity is 2.39  m/s, and with an assumed right atrial pressure of 15 mmHg, the estimated right ventricular systolic pressure is 37.8 mmHg. Left Atrium: Left atrial size was normal in size. Right Atrium: Right atrial size was normal in size. Pericardium: There is no evidence of pericardial effusion. Mitral Valve: The mitral valve is normal in structure. There is moderate thickening of the mitral valve leaflet(s). There is moderate calcification of the mitral valve leaflet(s). No evidence of mitral valve regurgitation. No evidence of mitral valve stenosis. Tricuspid Valve: The tricuspid valve is normal in structure. Tricuspid valve regurgitation is trivial. No evidence of tricuspid stenosis. Aortic Valve: The aortic valve is normal in structure. Aortic valve regurgitation is not visualized. Mild to moderate aortic valve sclerosis/calcification is present, without any evidence of aortic stenosis. Pulmonic Valve: The pulmonic valve was normal in structure. Pulmonic valve regurgitation is not visualized. No evidence of pulmonic stenosis. Aorta: The aortic root is normal in size and structure. Venous: The inferior vena cava is normal in size with greater than 50% respiratory variability, suggesting right atrial pressure of 3 mmHg. IAS/Shunts: No atrial level shunt detected by color flow Doppler.  LEFT VENTRICLE PLAX 2D LVIDd:         3.80 cm  Diastology LVIDs:         2.80 cm  LV e' medial:    5.22 cm/s LV PW:         1.10 cm  LV E/e' medial:  11.3 LV IVS:        1.10 cm  LV e' lateral:   6.31 cm/s LVOT diam:     2.00 cm  LV E/e' lateral: 9.4 LV SV:         41 LV SV Index:   21 LVOT Area:     3.14 cm  RIGHT VENTRICLE             IVC RV S prime:     10.00 cm/s  IVC diam: 2.75 cm TAPSE (M-mode): 2.3 cm LEFT ATRIUM           Index        RIGHT ATRIUM           Index LA diam:      3.40 cm 1.76 cm/m  RA Area:     17.90 cm LA Vol (A2C): 45.5 ml 23.51 ml/m RA Volume:   52.90 ml  27.33 ml/m LA Vol (A4C): 62.1 ml 32.09 ml/m  AORTIC VALVE LVOT Vmax:   73.10 cm/s LVOT Vmean:  59.100 cm/s LVOT VTI:    0.130 m  AORTA Ao Root diam: 3.10 cm Ao  Asc diam:  3.10 cm MITRAL VALVE               TRICUSPID VALVE MV Area (PHT): 3.19 cm    TR Peak grad:   22.8 mmHg MV Decel Time: 238 msec    TR Vmax:        239.00 cm/s MV E velocity: 59.10 cm/s MV A velocity: 60.80 cm/s  SHUNTS MV E/A ratio:  0.97        Systemic VTI:  0.13 m                            Systemic Diam: 2.00 cm Charlton HawsPeter Nishan MD Electronically signed by Charlton HawsPeter Nishan MD Signature Date/Time: 10/10/2020/1:14:53 PM    Final    CT Angio Chest/Abd/Pel for Dissection W and/or W/WO  Result Date: 10/09/2020 CLINICAL DATA:  Chest pain.  Concern for aortic dissection. EXAM: CT ANGIOGRAPHY CHEST, ABDOMEN AND PELVIS TECHNIQUE: Non-contrast CT of the chest was initially obtained. Multidetector CT imaging through the chest, abdomen and pelvis was performed using the standard protocol during bolus administration of intravenous contrast. Multiplanar reconstructed images and MIPs were obtained and reviewed to evaluate the vascular anatomy. CONTRAST:  100mL OMNIPAQUE IOHEXOL 350 MG/ML SOLN COMPARISON:  CT dated February 19, 2016. FINDINGS: CTA CHEST FINDINGS Cardiovascular: There is no evidence for a thoracic aortic dissection or aneurysm. Mild atherosclerotic changes are noted. There is no significant pericardial effusion. The heart size is normal. There is no large centrally located pulmonary embolism. Mediastinum/Nodes: --mild mediastinal adenopathy is noted. --mild hilar adenopathy is noted. -- No axillary lymphadenopathy. -- No supraclavicular lymphadenopathy. -- Normal thyroid gland where visualized. -  Unremarkable esophagus. Lungs/Pleura: Evaluation of the lung fields is limited by respiratory motion artifact.  There are streaky, linear airspace opacities throughout both lung fields without evidence for large focal infiltrate. There is atelectasis at the lung bases. There is no pneumothorax. No large pleural effusion. Musculoskeletal: No chest wall abnormality. No bony spinal canal stenosis. Review of the MIP images confirms the above findings. CTA ABDOMEN AND PELVIS FINDINGS VASCULAR Aorta: Normal caliber aorta without aneurysm, dissection, vasculitis or significant stenosis. Celiac: Patent without evidence of aneurysm, dissection, vasculitis or significant stenosis. SMA: Patent without evidence of aneurysm, dissection, vasculitis or significant stenosis. Renals: Both renal arteries are patent without evidence of aneurysm, dissection, vasculitis, fibromuscular dysplasia or significant stenosis. IMA: Patent without evidence of aneurysm, dissection, vasculitis or significant stenosis. Inflow: Patent without evidence of aneurysm, dissection, vasculitis or significant stenosis. Veins: No obvious venous abnormality within the limitations of this arterial phase study. Review of the MIP images confirms the above findings. NON-VASCULAR Hepatobiliary: The liver is enlarged. The caudate lobe is enlarged. Liver surface appears nodular. Normal gallbladder.There is no biliary ductal dilation. Pancreas: Normal contours without ductal dilatation. No peripancreatic fluid collection. Spleen: The spleen is enlarged measuring approximately 15 cm craniocaudad. Adrenals/Urinary Tract: --Adrenal glands: Unremarkable. --Right kidney/ureter: There is mild right-sided hydroureteronephrosis secondary to an obstructing 6 mm stone in the proximal right ureter (axial series 6, image 190). --Left kidney/ureter: No hydronephrosis or radiopaque kidney stones. --Urinary bladder: Unremarkable. Stomach/Bowel: --Stomach/Duodenum: There is a gastric band in place. --Small bowel: Unremarkable. --Colon: Rectosigmoid diverticulosis without acute inflammation.  --Appendix: The patient appears to be status post prior appendectomy. Lymphatic: --No retroperitoneal lymphadenopathy. --No mesenteric lymphadenopathy. --No pelvic or inguinal lymphadenopathy. Reproductive: Status post hysterectomy. No adnexal mass. Other: No ascites or free air. There is a fat containing umbilical hernia.  Musculoskeletal. No acute displaced fractures. Review of the MIP images confirms the above findings. IMPRESSION: 1. No evidence for aortic dissection or aneurysm. 2. Mild right-sided hydroureteronephrosis secondary to an obstructing 6 mm stone in the proximal right ureter. 3. Streaky, linear airspace opacities throughout both lung fields favored to represent atelectasis. 4. 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 5. There is a gastric band in place. 6. Rectosigmoid diverticulosis without acute inflammation. Aortic Atherosclerosis (ICD10-I70.0). Electronically Signed   By: Katherine Mantle M.D.   On: 10/09/2020 20:01   IR NEPHROSTOMY PLACEMENT RIGHT  Result Date: 10/10/2020 CLINICAL DATA:  Obstructing proximal right ureteral calculus and sepsis. The patient presents for right percutaneous nephrostomy tube placement for right renal decompression and treatment of presumed sepsis source. EXAM: 1. ULTRASOUND GUIDANCE FOR PUNCTURE OF THE RIGHT RENAL COLLECTING SYSTEM. 2. LEFT PERCUTANEOUS NEPHROSTOMY TUBE PLACEMENT. COMPARISON:  CTA on 10/09/2020 ANESTHESIA/SEDATION: 0.5 mg IV Versed; 75 mcg IV Fentanyl. Total Moderate Sedation Time 34 minutes. The patient's level of consciousness and physiologic status were continuously monitored during the procedure by Radiology nursing. CONTRAST:  8 ml Omnipaque 300 MEDICATIONS: 2 g IV Ancef. Antibiotic was administered in an appropriate time frame prior to skin puncture. FLUOROSCOPY TIME:  9 minutes and 48 seconds. 135 mGy. PROCEDURE: The procedure, risks, benefits, and alternatives were explained to the patient. Questions  regarding the procedure were encouraged and answered. The patient understands and consents to the procedure. A time-out was performed prior to initiating the procedure. Ultrasound was used to localize the right kidney. The right flank region was prepped with chlorhexidine in a sterile fashion, and a sterile drape was applied covering the operative field. A sterile gown and sterile gloves were used for the procedure. Local anesthesia was provided with 1% Lidocaine. Ultrasound was used to localize the right kidney. Under direct ultrasound guidance, a 21 gauge needle was advanced into the renal collecting system. Ultrasound image documentation was performed. Aspiration of urine sample was performed followed by contrast injection. A transitional dilator was advanced over a guidewire. Guidewire advancement was then performed via a 5 French catheter. Percutaneous tract dilatation was then performed over the guidewire. A 10-French percutaneous nephrostomy tube was then advanced and formed in the collecting system. Catheter position was confirmed by fluoroscopy after contrast injection. The catheter was secured at the skin with a Prolene retention suture and Stat-Lock device. A gravity bag was placed. COMPLICATIONS: Sullivan. FINDINGS: Initial ultrasound demonstrates mild right-sided hydronephrosis and attempt at opacification of the collecting system under ultrasound guidance with a 21 gauge needle was unsuccessful. Under fluoroscopy, there was residual faint opacification of the collecting system from excreted contrast administered at the time of CTA yesterday. After lower pole puncture under fluoroscopy, access was gained to the collecting system allowing placement of a 5 French catheter followed by the nephrostomy tube. The nephrostomy tube is formed in the renal pelvis. There was bleeding in the collecting system at the time of tube placement with clot present in the collecting system with contrast injection. IMPRESSION:  Right-sided percutaneous nephrostomy tube placement with placement of 10 French catheter formed in the renal pelvis. There was some bleeding at the time of tube placement with clot formation in the collecting system. The tube will be flushed and irrigated. The nephrostomy tube was connected to a gravity drainage bag. Electronically Signed   By: Irish Lack M.D.   On: 10/10/2020 16:50    Assessment/Plan: 59yo F with history of nephrolithiasis who presents with likely urosepsis, found  to have obstructing R ureteral stone on imaging with mild hydronephrosis. Now s/p R neph tube placement on 11/8.   - continue R nephrostomy tube at this time - recommend flushing neph tube with 10cc sterile saline as needed to ensure it does not clot off - recommend continuing foley catheter for complete drainage of urinary tract - agree with continued IV antibiotics at this time with close follow up of UCx, BCx to narrow as appropriate - agree with and appreciate continued support by critical care team - patient will require eventual outpatient definitive stone treatment with urology following resolution of UTI which will be arranged by urology - urology will continue to follow closely at this time   LOS: 2 days   [facility] 10/11/2020, 7:47 AM   I have seen and examined the patient and agree with the above assessment and plan.  Continue to flush nephrostomy tube q shift and prn.  Awaiting final culture results to direct antibody therapy.

## 2020-10-11 NOTE — Progress Notes (Signed)
Patient is in atrial fibrillation on monitor with controlled rate briefly and then converted back to normal sinus . Vernona Rieger Gleason PA notified

## 2020-10-11 NOTE — Progress Notes (Signed)
Patient states that year is [place] on assessment . She also is unable to complete some of her sentences . Dr Trudie Buckler and Vernona Rieger Gleason updated and at bedside to assess. Will continue to monitor

## 2020-10-12 LAB — CBC

## 2020-10-12 LAB — URINE CULTURE: Culture: >=100000 — AB

## 2020-10-12 LAB — BASIC METABOLIC PANEL
Anion gap: 10 (ref 5–15)
BUN: 63 mg/dL — ABNORMAL HIGH (ref 8–23)
CO2: 23 mmol/L (ref 22–32)
Calcium: 8.5 mg/dL — ABNORMAL LOW (ref 8.9–10.3)
Chloride: 105 mmol/L (ref 98–111)
Creatinine, Ser: 1.94 mg/dL — ABNORMAL HIGH (ref 0.44–1.00)
GFR, Estimated: 29 mL/min — ABNORMAL LOW (ref >60)
Glucose, Bld: 152 mg/dL — ABNORMAL HIGH (ref 70–99)
Potassium: 4.1 mmol/L (ref 3.5–5.1)
Sodium: 138 mmol/L (ref 135–145)

## 2020-10-12 LAB — GLUCOSE, CAPILLARY
Glucose-Capillary: 113 mg/dL — ABNORMAL HIGH (ref 70–99)
Glucose-Capillary: 125 mg/dL — ABNORMAL HIGH (ref 70–99)
Glucose-Capillary: 131 mg/dL — ABNORMAL HIGH (ref 70–99)
Glucose-Capillary: 142 mg/dL — ABNORMAL HIGH (ref 70–99)
Glucose-Capillary: 142 mg/dL — ABNORMAL HIGH (ref 70–99)
Glucose-Capillary: 189 mg/dL — ABNORMAL HIGH (ref 70–99)

## 2020-10-12 LAB — MAGNESIUM: Magnesium: 2.4 mg/dL (ref 1.7–2.4)

## 2020-10-12 MED ORDER — OXYCODONE HCL 5 MG PO TABS
5.0000 mg | ORAL_TABLET | ORAL | Status: DC | PRN
  Administered 2020-10-13 – 2020-10-16 (×6): 5 mg via ORAL
  Filled 2020-10-12 (×6): qty 1

## 2020-10-12 MED ORDER — TRAMADOL HCL 50 MG PO TABS: 50.0000 mg | ORAL_TABLET | Freq: Four times a day (QID) | ORAL | Status: DC | PRN

## 2020-10-12 NOTE — Progress Notes (Signed)
Updated pt's son via phone.   Al questions answered to best of my ability.

## 2020-10-12 NOTE — Progress Notes (Signed)
Patient ID: Shirley Sullivan, female   DOB: September 25, 1961, 59 y.o.   MRN: [id number]    Subjective: Pt feeling slightly better this morning.  Off pressors since yesterday morning.  Objective: Vital signs in last 24 hours: Temp:  [97.7 F (36.5 C)-98.6 F (37 C)] 98.1 F (36.7 C) (11/10 0355) Pulse Rate:  [79-103] 90 (11/10 0700) Resp:  [17-28] 19 (11/10 0700) BP: (85-112)/(59-79) 105/74 (11/10 0700) SpO2:  [86 %-99 %] 97 % (11/10 0700) Weight:  [89.7 kg] 89.7 kg (11/10 0500)  Intake/Output from previous day: 11/09 0701 - 11/10 0700 In: 1337.1 [P.O.:530; I.V.:574.5; IV Piggyback:172.6] Out: 1250 [Urine:1250] Intake/Output this shift: No intake/output data recorded.  Physical Exam:  General: Alert and oriented GU: PCN with dark red/brownish drainage  Lab Results: Recent Labs    10/10/20 1752 10/11/20 0144 10/12/20 0440  HGB 11.2* 12.3 10.8*  HCT 34.4* 38.0 33.1*   BMET Recent Labs    10/11/20 0144 10/12/20 0440  NA 139 138  K 5.1 4.1  CL 103 105  CO2 19* 23  GLUCOSE 156* 152*  BUN 57* 63*  CREATININE 2.95* 1.94*  CALCIUM 8.1* 8.5*     Studies/Results: Urine culture pending.  Enterobacter with final sensitivities pending.  Assessment/Plan: 1) Right ureteral stone: Pt improving. Continuing cefepime pending final sensitivities. Nephrostomy tube draining better.  Continue PCN drainage an antibiotic therapy for 10-14 days.  Will proceed with outpatient treatment of stone once acute infection is resolved.   LOS: 3 days   Crecencio Mc 10/12/2020, 7:09 AM

## 2020-10-12 NOTE — Progress Notes (Signed)
Patient wearing oxygen set at 1lpm with Sp02=97%. Patient stated she had no trouble breathing and wasn't short of breath at this time. Bipap not needed at this time.

## 2020-10-12 NOTE — TOC Initial Note (Signed)
Transition of Care [facility]) - Initial/Assessment Note    Patient Details  Name: Shirley Sullivan MRN: [id number] Date of Birth: 03/12/1961  Transition of Care [facility]) CM/SW Contact:    Lawerance Sabal, RN Phone Number: 10/12/2020, 10:11 AM  Clinical Narrative:              Spoke w patient at bedside. She states that she is from home w spouse, describes herself as independent prior to admission. She confirms that she does not have insurance. She has provider, Thompson Grayer PA. She states she uses low cost medicine and declines barriers to obtaining them.  She and spouse both drive. TOC will continue to follow for meds and O2.    Expected Discharge Plan: Home/Self Care Barriers to Discharge: Continued Medical Work up   Patient Goals and CMS Choice Patient states their goals for this hospitalization and ongoing recovery are:: to go home      Expected Discharge Plan and Services Expected Discharge Plan: Home/Self Care   Discharge Planning Services: CM Consult                                          Prior Living Arrangements/Services   Lives with:: Spouse                   Activities of Daily Living Home Assistive Devices/Equipment: None ADL Screening (condition at time of admission) Patient's cognitive ability adequate to safely complete daily activities?: Yes Is the patient deaf or have difficulty hearing?: No Does the patient have difficulty seeing, even when wearing glasses/contacts?: No Does the patient have difficulty concentrating, remembering, or making decisions?: No Patient able to express need for assistance with ADLs?: Yes Does the patient have difficulty dressing or bathing?: No Independently performs ADLs?: No Communication: Independent Dressing (OT): Needs assistance Is this a change from baseline?: Change from baseline, expected to last <3days Grooming: Needs assistance Is this a change from baseline?: Change from baseline, expected to last <3  days Feeding: Needs assistance Is this a change from baseline?: Change from baseline, expected to last <3 days Bathing: Needs assistance Is this a change from baseline?: Change from baseline, expected to last <3 days Toileting: Needs assistance Is this a change from baseline?: Change from baseline, expected to last <3 days In/Out Bed: Needs assistance Is this a change from baseline?: Change from baseline, expected to last <3 days Does the patient have difficulty walking or climbing stairs?: No Weakness of Legs: None Weakness of Arms/Hands: None  Permission Sought/Granted                  Emotional Assessment              Admission diagnosis:  Ureteral stone with hydronephrosis [N13.2] Septic shock (HCC) [A41.9, R65.21] Acute renal failure, unspecified acute renal failure type [facility]) [N17.9] Patient Active Problem List   Diagnosis Date Noted  . Acute renal failure (HCC)   . Hydronephrosis   . Septic shock (HCC) 10/09/2020   PCP:  Patient, No Pcp Per Pharmacy:   [facility] [phone] [place], [place] - [phone] AT [facility] [address] [facility] [address] [place] [address] Phone: [phone] Fax: [phone]  [facility] - [place], [place] - [address] [facility] [address] [place] [address] Phone: [phone] Fax: [phone]     Social Determinants of  Health (SDOH) Interventions    Readmission Risk Interventions No flowsheet data found.

## 2020-10-12 NOTE — Progress Notes (Signed)
Referring Physician(s): Dr. Hart Robinsons  Supervising Physician: Irish Lack  Patient Status:  [facility] - In-pt  Chief Complaint:  Left sided hydroureteronephrosis with AKI and sepsis s/p left nephrostomy tube placed on 10/10/20 by Dr. Fredia Sorrow  Subjective: Patient asleep (recent pain medication) but she did open her eyes briefly during assessment. Per bedside RN the patient seems more oriented and aware.   Allergies: Codeine, Strawberry extract, and Vibramycin [doxycycline calcium]  Medications: Prior to Admission medications   Medication Sig Start Date End Date Taking? Authorizing Provider  albuterol (PROVENTIL HFA;VENTOLIN HFA) 108 (90 BASE) MCG/ACT inhaler Inhale 1 puff into the lungs every 4 (four) hours as needed for wheezing or shortness of breath. 01/20/15  Yes Andrena Mews, DO  ALPRAZolam Prudy Feeler) 0.5 MG tablet Take 0.5 mg by mouth 2 (two) times daily as needed for anxiety.    Yes [provider]  aspirin EC 81 MG tablet Take 81 mg by mouth daily as needed (chest pain).    Yes [provider]  cetirizine (ZYRTEC) 10 MG tablet Take 10 mg by mouth at bedtime.    Yes [provider]  gabapentin (NEURONTIN) 300 MG capsule Take 300 mg by mouth 2 (two) times daily.  09/23/20  Yes [provider]  ibuprofen (ADVIL,MOTRIN) 200 MG tablet Take 400 mg by mouth every 6 (six) hours as needed for moderate pain (right knee pain).    Yes [provider]  sertraline (ZOLOFT) 100 MG tablet Take 150 mg by mouth at bedtime.    Yes [provider]  benzonatate (TESSALON) 100 MG capsule Take 1 capsule (100 mg total) by mouth 2 (two) times daily as needed for cough. Patient not taking: Reported on 02/19/2016 01/20/15   Andrena Mews, DO  HYDROcodone-acetaminophen (NORCO/VICODIN) 5-325 MG per tablet Take 1-2 tablets by mouth every 4 (four) hours as needed. Patient not taking: Reported on 02/19/2016 01/24/15   Linwood Dibbles, MD  ondansetron (ZOFRAN ODT) 4  MG disintegrating tablet  ODT q4 hours prn nausea/vomit Patient not taking: Reported on 03/10/2017 02/19/16   Bethann Berkshire, MD  oxyCODONE-acetaminophen (PERCOCET) 5-325 MG tablet Take 2 tablets by mouth every 4 (four) hours as needed. Patient not taking: Reported on 03/10/2017 02/19/16   Bethann Berkshire, MD  pantoprazole (PROTONIX) 20 MG tablet Take 1 tablet (20 mg total) by mouth daily. Patient not taking: Reported on 02/19/2016 01/24/15   Linwood Dibbles, MD  tamsulosin [facility]) 0.4 MG CAPS capsule Take 1 capsule (0.4 mg total) by mouth daily after breakfast. Patient not taking: Reported on 03/10/2017 02/19/16   Bethann Berkshire, MD     Vital Signs: BP 104/71   Pulse 87   Temp 98.8 F (37.1 C) (Axillary)   Resp 17   Ht  (1.626 m)   Wt 197 lb 12 oz (89.7 kg)   SpO2 97%   BMI 33.94 kg/m   Physical Exam Constitutional:      General: She is not in acute distress.    Comments: Patient asleep but did open eyes briefly during assessment  Cardiovascular:     Rate and Rhythm: Normal rate and regular rhythm.  Pulmonary:     Effort: Pulmonary effort is normal.  Abdominal:     Comments: Right nephrostomy tube to gravity. Approximately 20 ml dark serosanguineous fluid in bag. Per RN drain easily flushes but does cause the patient some discomfort.   Genitourinary:    Comments: Foley catheter Skin:    General: Skin is warm and  dry.     Imaging: DG Chest Port 1 View  Result Date: 10/09/2020 CLINICAL DATA:  Questionable sepsis EXAM: PORTABLE CHEST 1 VIEW COMPARISON:  March 10, 2017 FINDINGS: The heart size and mediastinal contours are within normal limits. There is mild prominence of the central pulmonary vasculature. The visualized skeletal structures are unremarkable. IMPRESSION: Mild pulmonary vascular congestion Electronically Signed   By: Jonna Clark M.D.   On: 10/09/2020 18:36   ECHOCARDIOGRAM COMPLETE  Result Date: 10/10/2020    ECHOCARDIOGRAM REPORT   Patient Name:   Shirley Sullivan Date  of Exam: 10/10/2020 Medical Rec #:  [id number]     Height:       64.0 in Accession #:    [id number]    Weight:       195.0 lb Date of Birth:  [birth year]      BSA:          1.935 m Patient Age:    59 years      BP:           106/77 mmHg Patient Gender: F             HR:           105 bpm. Exam Location:  Inpatient Procedure: 2D Echo, Color Doppler, Cardiac Doppler and Intracardiac            Opacification Agent Indications:    Septic Shock  History:        Patient has no prior history of Echocardiogram examinations.                 Risk Factors:Diabetes and COVID+ 08/2020.  Sonographer:    Irving Burton Senior RDCS Referring Phys: [phone] PAULA B SIMPSON IMPRESSIONS  1. Left ventricular ejection fraction, by estimation, is 55 to 60%. The left ventricle has normal function. The left ventricle has no regional wall motion abnormalities. Left ventricular diastolic parameters were normal.  2. Right ventricular systolic function is normal. The right ventricular size is normal. There is mildly elevated pulmonary artery systolic pressure.  3. The mitral valve is normal in structure. No evidence of mitral valve regurgitation. No evidence of mitral stenosis.  4. The aortic valve is normal in structure. Aortic valve regurgitation is not visualized. Mild to moderate aortic valve sclerosis/calcification is present, without any evidence of aortic stenosis.  5. The inferior vena cava is normal in size with greater than 50% respiratory variability, suggesting right atrial pressure of 3 mmHg. FINDINGS  Left Ventricle: Left ventricular ejection fraction, by estimation, is 55 to 60%. The left ventricle has normal function. The left ventricle has no regional wall motion abnormalities. Definity contrast agent was given IV to delineate the left ventricular  endocardial borders. The left ventricular internal cavity size was normal in size. There is no left ventricular hypertrophy. Left ventricular diastolic parameters were normal. Right Ventricle: The  right ventricular size is normal. No increase in right ventricular wall thickness. Right ventricular systolic function is normal. There is mildly elevated pulmonary artery systolic pressure. The tricuspid regurgitant velocity is 2.39  m/s, and with an assumed right atrial pressure of 15 mmHg, the estimated right ventricular systolic pressure is 37.8 mmHg. Left Atrium: Left atrial size was normal in size. Right Atrium: Right atrial size was normal in size. Pericardium: There is no evidence of pericardial effusion. Mitral Valve: The mitral valve is normal in structure. There is moderate thickening of the mitral valve leaflet(s). There is moderate calcification of the mitral valve leaflet(s).  No evidence of mitral valve regurgitation. No evidence of mitral valve stenosis. Tricuspid Valve: The tricuspid valve is normal in structure. Tricuspid valve regurgitation is trivial. No evidence of tricuspid stenosis. Aortic Valve: The aortic valve is normal in structure. Aortic valve regurgitation is not visualized. Mild to moderate aortic valve sclerosis/calcification is present, without any evidence of aortic stenosis. Pulmonic Valve: The pulmonic valve was normal in structure. Pulmonic valve regurgitation is not visualized. No evidence of pulmonic stenosis. Aorta: The aortic root is normal in size and structure. Venous: The inferior vena cava is normal in size with greater than 50% respiratory variability, suggesting right atrial pressure of 3 mmHg. IAS/Shunts: No atrial level shunt detected by color flow Doppler.  LEFT VENTRICLE PLAX 2D LVIDd:         3.80 cm  Diastology LVIDs:         2.80 cm  LV e' medial:    5.22 cm/s LV PW:         1.10 cm  LV E/e' medial:  11.3 LV IVS:        1.10 cm  LV e' lateral:   6.31 cm/s LVOT diam:     2.00 cm  LV E/e' lateral: 9.4 LV SV:         41 LV SV Index:   21 LVOT Area:     3.14 cm  RIGHT VENTRICLE             IVC RV S prime:     10.00 cm/s  IVC diam: 2.75 cm TAPSE (M-mode): 2.3 cm LEFT  ATRIUM           Index       RIGHT ATRIUM           Index LA diam:      3.40 cm 1.76 cm/m  RA Area:     17.90 cm LA Vol (A2C): 45.5 ml 23.51 ml/m RA Volume:   52.90 ml  27.33 ml/m LA Vol (A4C): 62.1 ml 32.09 ml/m  AORTIC VALVE LVOT Vmax:   73.10 cm/s LVOT Vmean:  59.100 cm/s LVOT VTI:    0.130 m  AORTA Ao Root diam: 3.10 cm Ao Asc diam:  3.10 cm MITRAL VALVE               TRICUSPID VALVE MV Area (PHT): 3.19 cm    TR Peak grad:   22.8 mmHg MV Decel Time: 238 msec    TR Vmax:        239.00 cm/s MV E velocity: 59.10 cm/s MV A velocity: 60.80 cm/s  SHUNTS MV E/A ratio:  0.97        Systemic VTI:  0.13 m                            Systemic Diam: 2.00 cm Charlton Haws MD Electronically signed by Charlton Haws MD Signature Date/Time: 10/10/2020/1:14:53 PM    Final    CT Angio Chest/Abd/Pel for Dissection W and/or W/WO  Result Date: 10/09/2020 CLINICAL DATA:  Chest pain.  Concern for aortic dissection. EXAM: CT ANGIOGRAPHY CHEST, ABDOMEN AND PELVIS TECHNIQUE: Non-contrast CT of the chest was initially obtained. Multidetector CT imaging through the chest, abdomen and pelvis was performed using the standard protocol during bolus administration of intravenous contrast. Multiplanar reconstructed images and MIPs were obtained and reviewed to evaluate the vascular anatomy. CONTRAST:  OMNIPAQUE IOHEXOL 350 MG/ML SOLN COMPARISON:  CT dated February 19, 2016. FINDINGS:  CTA CHEST FINDINGS Cardiovascular: There is no evidence for a thoracic aortic dissection or aneurysm. Mild atherosclerotic changes are noted. There is no significant pericardial effusion. The heart size is normal. There is no large centrally located pulmonary embolism. Mediastinum/Nodes: --mild mediastinal adenopathy is noted. --mild hilar adenopathy is noted. -- No axillary lymphadenopathy. -- No supraclavicular lymphadenopathy. -- Normal thyroid gland where visualized. -  Unremarkable esophagus. Lungs/Pleura: Evaluation of the lung fields is limited by  respiratory motion artifact. There are streaky, linear airspace opacities throughout both lung fields without evidence for large focal infiltrate. There is atelectasis at the lung bases. There is no pneumothorax. No large pleural effusion. Musculoskeletal: No chest wall abnormality. No bony spinal canal stenosis. Review of the MIP images confirms the above findings. CTA ABDOMEN AND PELVIS FINDINGS VASCULAR Aorta: Normal caliber aorta without aneurysm, dissection, vasculitis or significant stenosis. Celiac: Patent without evidence of aneurysm, dissection, vasculitis or significant stenosis. SMA: Patent without evidence of aneurysm, dissection, vasculitis or significant stenosis. Renals: Both renal arteries are patent without evidence of aneurysm, dissection, vasculitis, fibromuscular dysplasia or significant stenosis. IMA: Patent without evidence of aneurysm, dissection, vasculitis or significant stenosis. Inflow: Patent without evidence of aneurysm, dissection, vasculitis or significant stenosis. Veins: No obvious venous abnormality within the limitations of this arterial phase study. Review of the MIP images confirms the above findings. NON-VASCULAR Hepatobiliary: The liver is enlarged. The caudate lobe is enlarged. Liver surface appears nodular. Normal gallbladder.There is no biliary ductal dilation. Pancreas: Normal contours without ductal dilatation. No peripancreatic fluid collection. Spleen: The spleen is enlarged measuring approximately 15 cm craniocaudad. Adrenals/Urinary Tract: --Adrenal glands: Unremarkable. --Right kidney/ureter: There is mild right-sided hydroureteronephrosis secondary to an obstructing 6 mm stone in the proximal right ureter (axial series 6, image 190). --Left kidney/ureter: No hydronephrosis or radiopaque kidney stones. --Urinary bladder: Unremarkable. Stomach/Bowel: --Stomach/Duodenum: There is a gastric band in place. --Small bowel: Unremarkable. --Colon: Rectosigmoid diverticulosis  without acute inflammation. --Appendix: The patient appears to be status post prior appendectomy. Lymphatic: --No retroperitoneal lymphadenopathy. --No mesenteric lymphadenopathy. --No pelvic or inguinal lymphadenopathy. Reproductive: Status post hysterectomy. No adnexal mass. Other: No ascites or free air. There is a fat containing umbilical hernia. Musculoskeletal. No acute displaced fractures. Review of the MIP images confirms the above findings. IMPRESSION: 1. No evidence for aortic dissection or aneurysm. 2. Mild right-sided hydroureteronephrosis secondary to an obstructing 6 mm stone in the proximal right ureter. 3. Streaky, linear airspace opacities throughout both lung fields favored to represent atelectasis. 4. 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 5. There is a gastric band in place. 6. Rectosigmoid diverticulosis without acute inflammation. Aortic Atherosclerosis (ICD10-I70.0). Electronically Signed   By: Katherine Mantle M.D.   On: 10/09/2020 20:01   IR NEPHROSTOMY PLACEMENT RIGHT  Result Date: 10/10/2020 CLINICAL DATA:  Obstructing proximal right ureteral calculus and sepsis. The patient presents for right percutaneous nephrostomy tube placement for right renal decompression and treatment of presumed sepsis source. EXAM: 1. ULTRASOUND GUIDANCE FOR PUNCTURE OF THE RIGHT RENAL COLLECTING SYSTEM. 2. LEFT PERCUTANEOUS NEPHROSTOMY TUBE PLACEMENT. COMPARISON:  CTA on 10/09/2020 ANESTHESIA/SEDATION: 0.5 mg IV Versed; 75 mcg IV Fentanyl. Total Moderate Sedation Time 34 minutes. The patient's level of consciousness and physiologic status were continuously monitored during the procedure by Radiology nursing. CONTRAST:  8 ml Omnipaque 300 MEDICATIONS: 2 g IV Ancef. Antibiotic was administered in an appropriate time frame prior to skin puncture. FLUOROSCOPY TIME:  9 minutes and 48 seconds. 135 mGy. PROCEDURE: The procedure, risks, benefits, and alternatives  were  explained to the patient. Questions regarding the procedure were encouraged and answered. The patient understands and consents to the procedure. A time-out was performed prior to initiating the procedure. Ultrasound was used to localize the right kidney. The right flank region was prepped with chlorhexidine in a sterile fashion, and a sterile drape was applied covering the operative field. A sterile gown and sterile gloves were used for the procedure. Local anesthesia was provided with 1% Lidocaine. Ultrasound was used to localize the right kidney. Under direct ultrasound guidance, a 21 gauge needle was advanced into the renal collecting system. Ultrasound image documentation was performed. Aspiration of urine sample was performed followed by contrast injection. A transitional dilator was advanced over a guidewire. Guidewire advancement was then performed via a 5 French catheter. Percutaneous tract dilatation was then performed over the guidewire. A 10-French percutaneous nephrostomy tube was then advanced and formed in the collecting system. Catheter position was confirmed by fluoroscopy after contrast injection. The catheter was secured at the skin with a Prolene retention suture and Stat-Lock device. A gravity bag was placed. COMPLICATIONS: None. FINDINGS: Initial ultrasound demonstrates mild right-sided hydronephrosis and attempt at opacification of the collecting system under ultrasound guidance with a 21 gauge needle was unsuccessful. Under fluoroscopy, there was residual faint opacification of the collecting system from excreted contrast administered at the time of CTA yesterday. After lower pole puncture under fluoroscopy, access was gained to the collecting system allowing placement of a 5 French catheter followed by the nephrostomy tube. The nephrostomy tube is formed in the renal pelvis. There was bleeding in the collecting system at the time of tube placement with clot present in the collecting system  with contrast injection. IMPRESSION: Right-sided percutaneous nephrostomy tube placement with placement of 10 French catheter formed in the renal pelvis. There was some bleeding at the time of tube placement with clot formation in the collecting system. The tube will be flushed and irrigated. The nephrostomy tube was connected to a gravity drainage bag. Electronically Signed   By: Irish Lack M.D.   On: 10/10/2020 16:50    Labs:  CBC: Recent Labs    10/10/20 0133 10/10/20 0133 10/10/20 1553 10/10/20 1752 10/11/20 0144 10/11/20 1415 10/12/20 0440  WBC 14.6*  --  2.1*  --  11.6*  --  8.6  HGB 12.1   < > 12.6 11.2* 12.3  --  10.8*  HCT 37.5   < > 40.1 34.4* 38.0  --  33.1*  PLT 43*   < > 34*  --  32* 23* 23*   < > = values in this interval not displayed.    COAGS: Recent Labs    10/09/20 1750 10/10/20 0133 10/11/20 1415  INR 1.5* 1.4* 1.3*  APTT 37*  --  32    BMP: Recent Labs    10/10/20 0429 10/10/20 1553 10/11/20 0144 10/12/20 0440  NA 139 137 139 138  K 4.9 4.7 5.1 4.1  CL 104 103 103 105  CO2 13* 18* 19* 23  GLUCOSE 120* 146* 156* 152*  BUN 44* 49* 57* 63*  CALCIUM 8.4* 8.2* 8.1* 8.5*  CREATININE 3.25* 2.97* 2.95* 1.94*  GFRNONAA 16* 17* 18* 29*    LIVER FUNCTION TESTS: Recent Labs    10/09/20 1958 10/10/20 0429 10/11/20 0144  BILITOT 0.8 1.0 1.1  AST 43* 49* 39  ALT 30 33 30  ALKPHOS 51 65 158*  PROT 5.4* 6.1* 6.1*  ALBUMIN 2.3* 2.6* 2.4*    Assessment  and Plan:  Left sided hydroureteronephrosis with AKI and sepsis s/p left nephrostomy tube placed on 10/10/20: 150 ml output documented in Epic. 20 ml in gravity bag. BUN/Cr 63/1.94. WBC 8.6.   Continue flushing drain and documenting output. Change dressing daily or as needed.   Further plans per urology. IR will continue to follow.   Electronically Signed: Alwyn RenJamie Litha Lamartina, AGACNP-BC [phone] 10/12/2020, 11:11 AM   I spent a total of 15 Minutes at the the patient's bedside AND on  the patient's hospital floor or unit, greater than 50% of which was counseling/coordinating care for right nephrostomy tube.

## 2020-10-12 NOTE — Progress Notes (Signed)
NAME:  Shirley Sullivan, MRN:  [id number], DOB:  [birth year], LOS: 2 ADMISSION DATE:  10/09/2020, CONSULTATION DATE:  10/09/2020 REFERRING MD: [place] Robinson, PA, CHIEF COMPLAINT:  Septic shock  Brief History   59 year old female presenting with abdominal/ flank pain, UTI symptoms, N/V/D, and intermittent chest pain since passing a kidney stone on 11/4.  Found to be in septic shock with UTI and obstructing right ureteral stone with mild hydronephrosis with AKI requiring vasopressor support.   History of present illness   59 year old female with DM, asthma, nephrolithiasis, lupus (no flare in last 5 years/ not on home, depression/ anxiety, arthritis, and COVID-19 (positive on 08/15/2020) presenting to ER with complaints of not feeling well since Thursday.  She thinks she passed a kidney stone on the right and since has continued to fell bad with suprapubic abdominal pain, bilateral flank pain, back pain, nausea and vomiting, decreased urinary output, dysuria, some confusion, generalized weakness, diarrhea, complaints of feeling hot, intermittent midsternum chest pain, and mild shortness of breath.   In ER, she was afebrile, tachycardic 120, hypotensive 75/59, and 93% on room air.   Labs noted for normal WBC, platelets 42, lactic 9.6-> 7.7, CO2 15, glucose 110, BUN 38, sCr 3.49, AG 18, albumin 2.3, AST 43, t. Protein 5.4, BNP 1372, troponin hs 429, INR 1.5, PT 17, UA with large Hgb, trace leukocytes, positive nitrates, protein 100, high SG, many bacteria, RBC > 50, > 50 WBC. EKG unchanged from prior.  CXR showed mild pulmonary vascular congestion, and given back and chest pain with concern for dissection, CTA chest/ abd/ pelvis was negative for dissection or aneurysm, but showed an obstructing 6 mm stone in the proximal right ureter with mild right sided hydroureteronephrosis; also noted for hepatosplenomegaly with likely underlying cirrhosis and portal hypertension.  She received a total of 3L of fluids with  persistent hypotension, therefore levophed was started.  Urology and IR were consulted.  At this time, IR is not recommending percutaneous nephrostomy tube due to mild hydronephrosis and body habitus.  PCCM called for admission.   Past Medical History  DM, asthma, nephrolithiasis, lupus, depression/ anxiety, COVID 19 (positive on 08/15/2020), arthritis, previous lap band placement, Non smoker, non drinker  Significant Hospital Events   11/7 admitted  11/8 IR perc nephrostomy tube  Consults:  Urology IR   Procedures:      Significant Diagnostic Tests:  10/09/2020 CTA chest/ abd/ pelvis >> 1. No evidence for aortic dissection or aneurysm. 2. Mild right-sided hydroureteronephrosis secondary to an obstructing 6 mm stone in the proximal right ureter. 3. Streaky, linear airspace opacities throughout both lung fields favored to represent atelectasis. 4. 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 5. There is a gastric band in place. 6. Rectosigmoid diverticulosis without acute inflammation.  11/8 Echo>>EF 55-60%, There is mildly elevated pulmonary artery systolic pressure.  Micro Data:  11/7 BCx2 >>gram neg rodsx2>>Enterobacterales, Enterobacter cloacae, Klebsiella oxytoca>>resistant to  11/7 UC >>100,000 gm neg rods>>resistant to cefazolin and ampicillin, intermediate nitrofurantoin 11/7 SARS 2 >> positive, Flu >> neg  Antimicrobials:  Ceftriaxone 11/7-11/8 Cefepime 11/8-  Interim history/subjective:  More alert this morning, no overnight events, denies any discomfort  Objective   Blood pressure 116/73, pulse 94, temperature 98.8 F (37.1 C), temperature source Oral, resp. rate (!) 25, height 5\' 4"  (1.626 m), weight 88.5 kg, SpO2 98 %.        Intake/Output Summary (Last 24 hours) at 10/11/2020 0734 Last data filed at  10/11/2020 [id number] Gross per 24 hour  Intake 3245.06 ml  Output 1026 ml  Net 2219.06 ml   Filed Weights   10/09/20 1733  10/11/20 0253  Weight: 88.5 kg 88.5 kg   Examination: General:  Awake, fatigued, occasionally moaning but in no acute distress HEENT: MM pink/dry Neuro: awake, oriented to person, still fatigued appearing and slightly slow to answer questions, but no focal deficits CV: rr, ST, no murmur PULM: clear bilaterally on room air GI: obese, soft, +bs, nephrostomy tube in place with minimal brown drainage Extremities: cool/dry, no LE edema  Skin: no rashes   Resolved Hospital Problem list   Shock AGMA  Assessment & Plan:   Septic shock and Enterobacter/Klebsiella bacteremia secondary to UTI, pyelonephritis and obstructing R 6mm stone Shock improving and off pressors, Klebsiella and Enterobacter sensitive to cefepime.  S/p percutaneous nephrostomy tube in IR yesterday  P:  -appreciate Urology recommendations, continue abx for 10-14 day course and percutaneous drainage.  Will need definitive stone treatment as an outpatient when acute infection has imrproved -Nausea and pain control , stopped morphine in the event contributing to encephalopathy, transition to oral oxy   Acute Kidney Injury Most likely post-obstruction related to obstructing nephrolithiasis, may be component of pre-renal given N/V/D.  Baseline creatinine normal three years ago P: -Creatinine continues to improve -follow UOP, renal indices and avoid nephrotoxins    Thrombocytopenia - suspect related to gram negative bacteremia vs underlying liver disease/ hepatic congestion P:  -DIC panel with D-dimer 7.01 but normal PTT and elevated fibrinogen.  ADAMS-13 pending, check peripheral smear. Transfuse for platelets <10k or signs of bleeding -Trend CBC -hold Asa   Acute Encephalopathy Non-focal exam, suspect secondary to gm negative sepsis and morphine. Ammonia normal P: -improved today -transition to po oxycodone as able with prn low dose Dilaudid  Elevated troponin  Chest pain resolved Echo reassuring P:  -suspect  demand ischemia -holding Asa secondary to thrombocytopenia  Hepatosplenomegaly  with possibly underlying cirrhosis and portal hypertension, denies ETOH P:  -LFT's and INR improving -Will need further GI outpatient workup  Mild hypoxemia - atelectasis noted on CTA P:  Aggressive pulmonary hygiene Supplemental O2 prn   COVID positive-  initially positive on 9/13 P:  Out of 21 day window, likely still viral shedding  DM P:  Trend CBG q 4, add SSI prn  Best practice:  Diet: NPO Pain/Anxiety/Delirium protocol (if indicated): oxycodone VAP protocol (if indicated): n/a DVT prophylaxis: SCDs given thrombocytopenia  GI prophylaxis: n/a Glucose control: CBG q 4 Mobility: BR Code Status: full  Family Communication: patient  updated on plan of care Disposition: admit to ICU   Labs   CBC: Recent Labs  Lab 10/09/20 1750 10/10/20 0133 10/10/20 1553 10/10/20 1752  WBC 6.6 14.6* 2.1*  --   NEUTROABS 5.3  --   --   --   HGB 12.5 12.1 12.6 11.2*  HCT 39.1 37.5 40.1 34.4*  MCV 87.7 85.6 87.6  --   PLT 42* 43* 34*  --     Basic Metabolic Panel: Recent Labs  Lab 10/09/20 1958 10/10/20 0429 10/10/20 1553  NA 136 139 137  K 4.0 4.9 4.7  CL 103 104 103  CO2 15* 13* 18*  GLUCOSE 110* 120* 146*  BUN 38* 44* 49*  CREATININE 3.49* 3.25* 2.97*  CALCIUM 8.0* 8.4* 8.2*   GFR: Estimated Creatinine Clearance: 21.4 mL/min (A) (by C-G formula based on SCr of 2.97 mg/dL (H)). Recent Labs  Lab 10/09/20 1750  10/09/20 1958 10/10/20 0126 10/10/20 0133 10/10/20 0428 10/10/20 1007 10/10/20 1553 10/11/20 0144  WBC 6.6  --   --  14.6*  --   --  2.1*  --   LATICACIDVEN 9.6*   < > 6.5*  --  5.5* 5.3*  --  5.0*   < > = values in this interval not displayed.    Liver Function Tests: Recent Labs  Lab 10/09/20 1958 10/10/20 0429  AST 43* 49*  ALT 30 33  ALKPHOS 51 65  BILITOT 0.8 1.0  PROT 5.4* 6.1*  ALBUMIN 2.3* 2.6*   No results for input(s): LIPASE, AMYLASE in the last  168 hours. No results for input(s): AMMONIA in the last 168 hours.  ABG No results found for: PHART, PCO2ART, PO2ART, HCO3, TCO2, ACIDBASEDEF, O2SAT   Coagulation Profile: Recent Labs  Lab 10/09/20 1750 10/10/20 0133  INR 1.5* 1.4*    Cardiac Enzymes: No results for input(s): CKTOTAL, CKMB, CKMBINDEX, TROPONINI in the last 168 hours.  HbA1C: Hgb A1c MFr Bld  Date/Time Value Ref Range Status  10/10/2020 01:33 AM 5.8 (H) 4.8 - 5.6 % Final    Comment:    (NOTE) Pre diabetes:          5.7%-6.4%  Diabetes:              >6.4%  Glycemic control for   <7.0% adults with diabetes     CBG: Recent Labs  Lab 10/10/20 1134 10/10/20 1553 10/10/20 1950 10/10/20 2349 10/11/20 0342  GLUCAP 118* 131* 71 112* 150*    Review of Systems:   Review of Systems  Constitutional: Positive for malaise/fatigue. Negative for fever.  Respiratory: Positive for shortness of breath. Negative for cough, sputum production and wheezing.   Cardiovascular: Positive for chest pain. Negative for palpitations and leg swelling.  Gastrointestinal: Positive for abdominal pain, diarrhea, nausea and vomiting.  Genitourinary: Positive for dysuria and flank pain.  Musculoskeletal: Positive for back pain.  Skin: Negative for rash.  Neurological: Positive for weakness. Negative for focal weakness.   Past Medical History  She,  has a past medical history of Allergy, Anxiety, Arthritis, Asthma, COVID-19, Depression, Diabetes mellitus without complication (HCC), History of kidney stones (1998, 2015), and Lupus (HCC).   Surgical History    Past Surgical History:  Procedure Laterality Date  . ABDOMINAL HYSTERECTOMY  2004  . APPENDECTOMY  1992  . CYSTOSCOPY WITH RETROGRADE PYELOGRAM, URETEROSCOPY AND STENT PLACEMENT Left 08/11/2014   Procedure: CYSTOSCOPY WITH RETROGRADE PYELOGRAM, URETEROSCOPY AND STENT PLACEMENT,  DIGITAL FLEXIBLE URETEROSCOPE;  Surgeon: Heloise Purpura, MD;  Location: [facility];  Service: Urology;   Laterality: Left;  request digital flexible ureteroscope  . CYSTOSCOPY WITH RETROGRADE PYELOGRAM, URETEROSCOPY AND STENT PLACEMENT Left 08/23/2014   Procedure: CYSTOSCOPY WITH RETROGRADE PYELOGRAM, URETEROSCOPY AND STENT EXCHANGE;  Surgeon: Heloise Purpura, MD;  Location: [facility];  Service: Urology;  Laterality: Left;  . HOLMIUM LASER APPLICATION Left 08/23/2014   Procedure: HOLMIUM LASER APPLICATION;  Surgeon: Heloise Purpura, MD;  Location: [facility];  Service: Urology;  Laterality: Left;  . IR NEPHROSTOMY PLACEMENT RIGHT  10/10/2020  . ROTATOR CUFF REPAIR Right 2007     Social History   reports that she has never smoked. She has never used smokeless tobacco. She reports that she does not drink alcohol and does not use drugs.   Family History   Her family history includes Hyperlipidemia in her mother.   Allergies Allergies  Allergen Reactions  . Codeine Itching  . Strawberry Extract  Hives, Itching and Other (See Comments)    Can't breathe  . Vibramycin [Doxycycline Calcium] Nausea And Vomiting     Home Medications  Prior to Admission medications   Medication Sig Start Date End Date Taking? Authorizing Provider  albuterol (PROVENTIL HFA;VENTOLIN HFA) 108 (90 BASE) MCG/ACT inhaler Inhale 1 puff into the lungs every 4 (four) hours as needed for wheezing or shortness of breath. 01/20/15  Yes Andrena Mews, DO  ALPRAZolam Prudy Feeler) 0.5 MG tablet Take 0.5 mg by mouth 2 (two) times daily as needed for anxiety.    Yes [provider]  aspirin EC 81 MG tablet Take 81 mg by mouth daily as needed (chest pain).    Yes [provider]  cetirizine (ZYRTEC) 10 MG tablet Take 10 mg by mouth at bedtime.    Yes [provider]  gabapentin (NEURONTIN) 300 MG capsule Take 300 mg by mouth 2 (two) times daily.  09/23/20  Yes [provider]  ibuprofen (ADVIL,MOTRIN) 200 MG tablet Take 400 mg by mouth every 6 (six) hours as needed for moderate pain (right knee pain).    Yes  [provider]  sertraline (ZOLOFT) 100 MG tablet Take 150 mg by mouth at bedtime.    Yes [provider]  benzonatate (TESSALON) 100 MG capsule Take 1 capsule (100 mg total) by mouth 2 (two) times daily as needed for cough. Patient not taking: Reported on 02/19/2016 01/20/15   Andrena Mews, DO  HYDROcodone-acetaminophen (NORCO/VICODIN) 5-325 MG per tablet Take 1-2 tablets by mouth every 4 (four) hours as needed. Patient not taking: Reported on 02/19/2016 01/24/15   Linwood Dibbles, MD  ondansetron (ZOFRAN ODT) 4 MG disintegrating tablet 4mg  ODT q4 hours prn nausea/vomit Patient not taking: Reported on 03/10/2017 02/19/16   02/21/16, MD  oxyCODONE-acetaminophen (PERCOCET) 5-325 MG tablet Take 2 tablets by mouth every 4 (four) hours as needed. Patient not taking: Reported on 03/10/2017 02/19/16   02/21/16, MD  pantoprazole (PROTONIX) 20 MG tablet Take 1 tablet (20 mg total) by mouth daily. Patient not taking: Reported on 02/19/2016 01/24/15   01/26/15, MD  tamsulosin [facility]) 0.4 MG CAPS capsule Take 1 capsule (0.4 mg total) by mouth daily after breakfast. Patient not taking: Reported on 03/10/2017 02/19/16   02/21/16, MD     Critical care time:  33 mins        CRITICAL CARE Performed by: Bethann Berkshire Mahmood Boehringer   Total critical care time: Darcella Gasman  Critical care time was exclusive of separately billable procedures and treating other patients.  Critical care was necessary to treat or prevent imminent or life-threatening deterioration.  Critical care was time spent personally by me on the following activities: development of treatment plan with patient and/or surrogate as well as nursing, discussions with consultants, evaluation of patient's response to treatment, examination of patient, obtaining history from patient or surrogate, ordering and performing treatments and interventions, ordering and review of laboratory studies, ordering and review of radiographic  studies, pulse oximetry and re-evaluation of patient's condition.   Haizley Cannella, PA-C [facility]  Pager# [phone], if no answer [phone]

## 2020-10-13 LAB — COMPREHENSIVE METABOLIC PANEL
ALT: 21 U/L (ref 0–44)
AST: 31 U/L (ref 15–41)
Albumin: 1.9 g/dL — ABNORMAL LOW (ref 3.5–5.0)
Alkaline Phosphatase: 130 U/L — ABNORMAL HIGH (ref 38–126)
Anion gap: 10 (ref 5–15)
BUN: 50 mg/dL — ABNORMAL HIGH (ref 8–23)
CO2: 26 mmol/L (ref 22–32)
Calcium: 8.3 mg/dL — ABNORMAL LOW (ref 8.9–10.3)
Chloride: 104 mmol/L (ref 98–111)
Creatinine, Ser: 1.28 mg/dL — ABNORMAL HIGH (ref 0.44–1.00)
GFR, Estimated: 48 mL/min — ABNORMAL LOW (ref >60)
Glucose, Bld: 124 mg/dL — ABNORMAL HIGH (ref 70–99)
Potassium: 3.7 mmol/L (ref 3.5–5.1)
Sodium: 140 mmol/L (ref 135–145)
Total Bilirubin: 1.1 mg/dL (ref 0.3–1.2)
Total Protein: 5.9 g/dL — ABNORMAL LOW (ref 6.5–8.1)

## 2020-10-13 LAB — CBC WITH DIFFERENTIAL/PLATELET
Abs Immature Granulocytes: 0.09 10*3/uL — ABNORMAL HIGH (ref 0.00–0.07)
Basophils Absolute: 0 10*3/uL (ref 0.0–0.1)
Basophils Relative: 0 %
Eosinophils Absolute: 0.1 10*3/uL (ref 0.0–0.5)
Eosinophils Relative: 1 %
HCT: 33.7 % — ABNORMAL LOW (ref 36.0–46.0)
Hemoglobin: 11.1 g/dL — ABNORMAL LOW (ref 12.0–15.0)
Immature Granulocytes: 1 %
Lymphocytes Relative: 9 %
Lymphs Abs: 0.8 10*3/uL (ref 0.7–4.0)
MCH: 27.9 pg (ref 26.0–34.0)
MCHC: 32.9 g/dL (ref 30.0–36.0)
MCV: 84.7 fL (ref 80.0–100.0)
Monocytes Absolute: 0.5 10*3/uL (ref 0.1–1.0)
Monocytes Relative: 6 %
Neutro Abs: 7.5 10*3/uL (ref 1.7–7.7)
Neutrophils Relative %: 83 %
Platelets: 23 10*3/uL — CL (ref 150–400)
RBC: 3.98 MIL/uL (ref 3.87–5.11)
RDW: 15.7 % — ABNORMAL HIGH (ref 11.5–15.5)
WBC: 9 10*3/uL (ref 4.0–10.5)
nRBC: 0 % (ref 0.0–0.2)

## 2020-10-13 LAB — CULTURE, BLOOD (ROUTINE X 2)

## 2020-10-13 LAB — DIC (DISSEMINATED INTRAVASCULAR COAGULATION)PANEL
D-Dimer, Quant: 7.01 ug/mL-FEU — ABNORMAL HIGH (ref 0.00–0.50)
Fibrinogen: >800 mg/dL — ABNORMAL HIGH (ref 210–475)
INR: 1.3 — ABNORMAL HIGH (ref 0.8–1.2)
Platelets: 23 10*3/uL — CL (ref 150–400)
Prothrombin Time: 15.8 seconds — ABNORMAL HIGH (ref 11.4–15.2)
Smear Review: NONE SEEN
aPTT: 32 seconds (ref 24–36)

## 2020-10-13 LAB — CBC
HCT: 32.7 % — ABNORMAL LOW (ref 36.0–46.0)
Hemoglobin: 10.6 g/dL — ABNORMAL LOW (ref 12.0–15.0)
MCH: 27.3 pg (ref 26.0–34.0)
MCHC: 32.4 g/dL (ref 30.0–36.0)
MCV: 84.3 fL (ref 80.0–100.0)
Platelets: 26 10*3/uL — CL (ref 150–400)
RBC: 3.88 MIL/uL (ref 3.87–5.11)
RDW: 15.5 % (ref 11.5–15.5)
WBC: 10.4 10*3/uL (ref 4.0–10.5)
nRBC: 0 % (ref 0.0–0.2)

## 2020-10-13 LAB — GLUCOSE, CAPILLARY
Glucose-Capillary: 116 mg/dL — ABNORMAL HIGH (ref 70–99)
Glucose-Capillary: 113 mg/dL — ABNORMAL HIGH (ref 70–99)
Glucose-Capillary: 118 mg/dL — ABNORMAL HIGH (ref 70–99)
Glucose-Capillary: 117 mg/dL — ABNORMAL HIGH (ref 70–99)

## 2020-10-13 LAB — PATHOLOGIST SMEAR REVIEW

## 2020-10-13 MED ORDER — CIPROFLOXACIN HCL 500 MG PO TABS
500.0000 mg | ORAL_TABLET | Freq: Two times a day (BID) | ORAL | Status: DC
  Administered 2020-10-13 – 2020-10-16 (×7): 500 mg via ORAL
  Filled 2020-10-13 (×8): qty 1

## 2020-10-13 NOTE — Progress Notes (Signed)
Foley catheter removed and spouse Molly Maduro called to inform that pt will be transferring out of the ICU.

## 2020-10-13 NOTE — Progress Notes (Signed)
Patient arrived to unit a/o/vx4, oriented to room, V/S taken, right nephrostomy tube in placed  Dressing clean/dry/intact. Patient denies pain at this time, Husband at bedside. Will continue  to monitor.

## 2020-10-13 NOTE — Progress Notes (Signed)
Referring Physician(s): Patience Musca (urology)  Supervising Physician: Marliss Coots  Patient Status:  [facility] - In-pt  Chief Complaint: "Tired"  Subjective:  History of sepsis and right hydronephrosis secondary to obstructing proximal right ureteral calculus s/p right percutaneous nephrostomy tube placement in IR 10/10/2020. Patient awake and alert sitting in chair eating breakfast. Family member (female) at bedside. States she is tired this AM, no additional complaints. States RN recently just emptied her nephrostomy tube bag. Right nephrostomy tube site c/d/i.   Allergies: Codeine, Strawberry extract, and Vibramycin [doxycycline calcium]  Medications: Prior to Admission medications   Medication Sig Start Date End Date Taking? Authorizing Provider  albuterol (PROVENTIL HFA;VENTOLIN HFA) 108 (90 BASE) MCG/ACT inhaler Inhale 1 puff into the lungs every 4 (four) hours as needed for wheezing or shortness of breath. 01/20/15  Yes Andrena Mews, DO  ALPRAZolam Prudy Feeler) 0.5 MG tablet Take 0.5 mg by mouth 2 (two) times daily as needed for anxiety.    Yes [provider]  aspirin EC 81 MG tablet Take 81 mg by mouth daily as needed (chest pain).    Yes [provider]  cetirizine (ZYRTEC) 10 MG tablet Take 10 mg by mouth at bedtime.    Yes [provider]  gabapentin (NEURONTIN) 300 MG capsule Take 300 mg by mouth 2 (two) times daily.  09/23/20  Yes [provider]  ibuprofen (ADVIL,MOTRIN) 200 MG tablet Take 400 mg by mouth every 6 (six) hours as needed for moderate pain (right knee pain).    Yes [provider]  sertraline (ZOLOFT) 100 MG tablet Take 150 mg by mouth at bedtime.    Yes [provider]  benzonatate (TESSALON) 100 MG capsule Take 1 capsule (100 mg total) by mouth 2 (two) times daily as needed for cough. Patient not taking: Reported on 02/19/2016 01/20/15   Andrena Mews, DO  HYDROcodone-acetaminophen (NORCO/VICODIN)  5-325 MG per tablet Take 1-2 tablets by mouth every 4 (four) hours as needed. Patient not taking: Reported on 02/19/2016 01/24/15   Linwood Dibbles, MD  ondansetron (ZOFRAN ODT) 4 MG disintegrating tablet 4mg  ODT q4 hours prn nausea/vomit Patient not taking: Reported on 03/10/2017 02/19/16   02/21/16, MD  oxyCODONE-acetaminophen (PERCOCET) 5-325 MG tablet Take 2 tablets by mouth every 4 (four) hours as needed. Patient not taking: Reported on 03/10/2017 02/19/16   02/21/16, MD  pantoprazole (PROTONIX) 20 MG tablet Take 1 tablet (20 mg total) by mouth daily. Patient not taking: Reported on 02/19/2016 01/24/15   01/26/15, MD  tamsulosin [facility]) 0.4 MG CAPS capsule Take 1 capsule (0.4 mg total) by mouth daily after breakfast. Patient not taking: Reported on 03/10/2017 02/19/16   02/21/16, MD     Vital Signs: BP 110/70   Pulse 78   Temp 98.7 F (37.1 C) (Axillary)   Resp 19   Ht 5\' 4"  (1.626 m)   Wt 201 lb 4.5 oz (91.3 kg)   SpO2 98%   BMI 34.55 kg/m   Physical Exam Vitals and nursing note reviewed.  Constitutional:      General: She is not in acute distress. Pulmonary:     Effort: Pulmonary effort is normal. No respiratory distress.  Genitourinary:    Comments: Right nephrostomy tube site without tenderness, erythema, drainage, or active bleeding; minimal output of clear yellow urine with bloody debris in gravity bag. Skin:    General: Skin is warm and dry.  Neurological:     Mental Status: She is  alert and oriented to person, place, and time.     Imaging: DG Chest Port 1 View  Result Date: 10/09/2020 CLINICAL DATA:  Questionable sepsis EXAM: PORTABLE CHEST 1 VIEW COMPARISON:  March 10, 2017 FINDINGS: The heart size and mediastinal contours are within normal limits. There is mild prominence of the central pulmonary vasculature. The visualized skeletal structures are unremarkable. IMPRESSION: Mild pulmonary vascular congestion Electronically Signed   By: Jonna Clark M.D.    On: 10/09/2020 18:36   ECHOCARDIOGRAM COMPLETE  Result Date: 10/10/2020    ECHOCARDIOGRAM REPORT   Patient Name:   Shirley Sullivan Date of Exam: 10/10/2020 Medical Rec #:  [id number]     Height:       64.0 in Accession #:    [id number]    Weight:       195.0 lb Date of Birth:  09/11/1961      BSA:          1.935 m Patient Age:    59 years      BP:           106/77 mmHg Patient Gender: F             HR:           105 bpm. Exam Location:  Inpatient Procedure: 2D Echo, Color Doppler, Cardiac Doppler and Intracardiac            Opacification Agent Indications:    Septic Shock  History:        Patient has no prior history of Echocardiogram examinations.                 Risk Factors:Diabetes and COVID+ 08/2020.  Sonographer:    Irving Burton Senior RDCS Referring Phys: [phone] PAULA B SIMPSON IMPRESSIONS  1. Left ventricular ejection fraction, by estimation, is 55 to 60%. The left ventricle has normal function. The left ventricle has no regional wall motion abnormalities. Left ventricular diastolic parameters were normal.  2. Right ventricular systolic function is normal. The right ventricular size is normal. There is mildly elevated pulmonary artery systolic pressure.  3. The mitral valve is normal in structure. No evidence of mitral valve regurgitation. No evidence of mitral stenosis.  4. The aortic valve is normal in structure. Aortic valve regurgitation is not visualized. Mild to moderate aortic valve sclerosis/calcification is present, without any evidence of aortic stenosis.  5. The inferior vena cava is normal in size with greater than 50% respiratory variability, suggesting right atrial pressure of 3 mmHg. FINDINGS  Left Ventricle: Left ventricular ejection fraction, by estimation, is 55 to 60%. The left ventricle has normal function. The left ventricle has no regional wall motion abnormalities. Definity contrast agent was given IV to delineate the left ventricular  endocardial borders. The left ventricular internal cavity  size was normal in size. There is no left ventricular hypertrophy. Left ventricular diastolic parameters were normal. Right Ventricle: The right ventricular size is normal. No increase in right ventricular wall thickness. Right ventricular systolic function is normal. There is mildly elevated pulmonary artery systolic pressure. The tricuspid regurgitant velocity is 2.39  m/s, and with an assumed right atrial pressure of 15 mmHg, the estimated right ventricular systolic pressure is 37.8 mmHg. Left Atrium: Left atrial size was normal in size. Right Atrium: Right atrial size was normal in size. Pericardium: There is no evidence of pericardial effusion. Mitral Valve: The mitral valve is normal in structure. There is moderate thickening of the mitral valve leaflet(s). There is  moderate calcification of the mitral valve leaflet(s). No evidence of mitral valve regurgitation. No evidence of mitral valve stenosis. Tricuspid Valve: The tricuspid valve is normal in structure. Tricuspid valve regurgitation is trivial. No evidence of tricuspid stenosis. Aortic Valve: The aortic valve is normal in structure. Aortic valve regurgitation is not visualized. Mild to moderate aortic valve sclerosis/calcification is present, without any evidence of aortic stenosis. Pulmonic Valve: The pulmonic valve was normal in structure. Pulmonic valve regurgitation is not visualized. No evidence of pulmonic stenosis. Aorta: The aortic root is normal in size and structure. Venous: The inferior vena cava is normal in size with greater than 50% respiratory variability, suggesting right atrial pressure of 3 mmHg. IAS/Shunts: No atrial level shunt detected by color flow Doppler.  LEFT VENTRICLE PLAX 2D LVIDd:         3.80 cm  Diastology LVIDs:         2.80 cm  LV e' medial:    5.22 cm/s LV PW:         1.10 cm  LV E/e' medial:  11.3 LV IVS:        1.10 cm  LV e' lateral:   6.31 cm/s LVOT diam:     2.00 cm  LV E/e' lateral: 9.4 LV SV:         41 LV SV  Index:   21 LVOT Area:     3.14 cm  RIGHT VENTRICLE             IVC RV S prime:     10.00 cm/s  IVC diam: 2.75 cm TAPSE (M-mode): 2.3 cm LEFT ATRIUM           Index       RIGHT ATRIUM           Index LA diam:      3.40 cm 1.76 cm/m  RA Area:     17.90 cm LA Vol (A2C): 45.5 ml 23.51 ml/m RA Volume:   52.90 ml  27.33 ml/m LA Vol (A4C): 62.1 ml 32.09 ml/m  AORTIC VALVE LVOT Vmax:   73.10 cm/s LVOT Vmean:  59.100 cm/s LVOT VTI:    0.130 m  AORTA Ao Root diam: 3.10 cm Ao Asc diam:  3.10 cm MITRAL VALVE               TRICUSPID VALVE MV Area (PHT): 3.19 cm    TR Peak grad:   22.8 mmHg MV Decel Time: 238 msec    TR Vmax:        239.00 cm/s MV E velocity: 59.10 cm/s MV A velocity: 60.80 cm/s  SHUNTS MV E/A ratio:  0.97        Systemic VTI:  0.13 m                            Systemic Diam: 2.00 cm Charlton Haws MD Electronically signed by Charlton Haws MD Signature Date/Time: 10/10/2020/1:14:53 PM    Final    CT Angio Chest/Abd/Pel for Dissection W and/or W/WO  Result Date: 10/09/2020 CLINICAL DATA:  Chest pain.  Concern for aortic dissection. EXAM: CT ANGIOGRAPHY CHEST, ABDOMEN AND PELVIS TECHNIQUE: Non-contrast CT of the chest was initially obtained. Multidetector CT imaging through the chest, abdomen and pelvis was performed using the standard protocol during bolus administration of intravenous contrast. Multiplanar reconstructed images and MIPs were obtained and reviewed to evaluate the vascular anatomy. CONTRAST:  OMNIPAQUE IOHEXOL 350 MG/ML SOLN COMPARISON:  CT dated February 19, 2016. FINDINGS: CTA CHEST FINDINGS Cardiovascular: There is no evidence for a thoracic aortic dissection or aneurysm. Mild atherosclerotic changes are noted. There is no significant pericardial effusion. The heart size is normal. There is no large centrally located pulmonary embolism. Mediastinum/Nodes: --mild mediastinal adenopathy is noted. --mild hilar adenopathy is noted. -- No axillary lymphadenopathy. -- No supraclavicular  lymphadenopathy. -- Normal thyroid gland where visualized. -  Unremarkable esophagus. Lungs/Pleura: Evaluation of the lung fields is limited by respiratory motion artifact. There are streaky, linear airspace opacities throughout both lung fields without evidence for large focal infiltrate. There is atelectasis at the lung bases. There is no pneumothorax. No large pleural effusion. Musculoskeletal: No chest wall abnormality. No bony spinal canal stenosis. Review of the MIP images confirms the above findings. CTA ABDOMEN AND PELVIS FINDINGS VASCULAR Aorta: Normal caliber aorta without aneurysm, dissection, vasculitis or significant stenosis. Celiac: Patent without evidence of aneurysm, dissection, vasculitis or significant stenosis. SMA: Patent without evidence of aneurysm, dissection, vasculitis or significant stenosis. Renals: Both renal arteries are patent without evidence of aneurysm, dissection, vasculitis, fibromuscular dysplasia or significant stenosis. IMA: Patent without evidence of aneurysm, dissection, vasculitis or significant stenosis. Inflow: Patent without evidence of aneurysm, dissection, vasculitis or significant stenosis. Veins: No obvious venous abnormality within the limitations of this arterial phase study. Review of the MIP images confirms the above findings. NON-VASCULAR Hepatobiliary: The liver is enlarged. The caudate lobe is enlarged. Liver surface appears nodular. Normal gallbladder.There is no biliary ductal dilation. Pancreas: Normal contours without ductal dilatation. No peripancreatic fluid collection. Spleen: The spleen is enlarged measuring approximately 15 cm craniocaudad. Adrenals/Urinary Tract: --Adrenal glands: Unremarkable. --Right kidney/ureter: There is mild right-sided hydroureteronephrosis secondary to an obstructing 6 mm stone in the proximal right ureter (axial series 6, image 190). --Left kidney/ureter: No hydronephrosis or radiopaque kidney stones. --Urinary bladder:  Unremarkable. Stomach/Bowel: --Stomach/Duodenum: There is a gastric band in place. --Small bowel: Unremarkable. --Colon: Rectosigmoid diverticulosis without acute inflammation. --Appendix: The patient appears to be status post prior appendectomy. Lymphatic: --No retroperitoneal lymphadenopathy. --No mesenteric lymphadenopathy. --No pelvic or inguinal lymphadenopathy. Reproductive: Status post hysterectomy. No adnexal mass. Other: No ascites or free air. There is a fat containing umbilical hernia. Musculoskeletal. No acute displaced fractures. Review of the MIP images confirms the above findings. IMPRESSION: 1. No evidence for aortic dissection or aneurysm. 2. Mild right-sided hydroureteronephrosis secondary to an obstructing 6 mm stone in the proximal right ureter. 3. Streaky, linear airspace opacities throughout both lung fields favored to represent atelectasis. 4. 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 5. There is a gastric band in place. 6. Rectosigmoid diverticulosis without acute inflammation. Aortic Atherosclerosis (ICD10-I70.0). Electronically Signed   By: Katherine Mantle M.D.   On: 10/09/2020 20:01   IR NEPHROSTOMY PLACEMENT RIGHT  Result Date: 10/10/2020 CLINICAL DATA:  Obstructing proximal right ureteral calculus and sepsis. The patient presents for right percutaneous nephrostomy tube placement for right renal decompression and treatment of presumed sepsis source. EXAM: 1. ULTRASOUND GUIDANCE FOR PUNCTURE OF THE RIGHT RENAL COLLECTING SYSTEM. 2. LEFT PERCUTANEOUS NEPHROSTOMY TUBE PLACEMENT. COMPARISON:  CTA on 10/09/2020 ANESTHESIA/SEDATION: 0.5 mg IV Versed; 75 mcg IV Fentanyl. Total Moderate Sedation Time 34 minutes. The patient's level of consciousness and physiologic status were continuously monitored during the procedure by Radiology nursing. CONTRAST:  8 ml Omnipaque 300 MEDICATIONS: 2 g IV Ancef. Antibiotic was administered in an appropriate time frame  prior to skin puncture. FLUOROSCOPY TIME:  9 minutes and 48 seconds. 135 mGy.  PROCEDURE: The procedure, risks, benefits, and alternatives were explained to the patient. Questions regarding the procedure were encouraged and answered. The patient understands and consents to the procedure. A time-out was performed prior to initiating the procedure. Ultrasound was used to localize the right kidney. The right flank region was prepped with chlorhexidine in a sterile fashion, and a sterile drape was applied covering the operative field. A sterile gown and sterile gloves were used for the procedure. Local anesthesia was provided with 1% Lidocaine. Ultrasound was used to localize the right kidney. Under direct ultrasound guidance, a 21 gauge needle was advanced into the renal collecting system. Ultrasound image documentation was performed. Aspiration of urine sample was performed followed by contrast injection. A transitional dilator was advanced over a guidewire. Guidewire advancement was then performed via a 5 French catheter. Percutaneous tract dilatation was then performed over the guidewire. A 10-French percutaneous nephrostomy tube was then advanced and formed in the collecting system. Catheter position was confirmed by fluoroscopy after contrast injection. The catheter was secured at the skin with a Prolene retention suture and Stat-Lock device. A gravity bag was placed. COMPLICATIONS: None. FINDINGS: Initial ultrasound demonstrates mild right-sided hydronephrosis and attempt at opacification of the collecting system under ultrasound guidance with a 21 gauge needle was unsuccessful. Under fluoroscopy, there was residual faint opacification of the collecting system from excreted contrast administered at the time of CTA yesterday. After lower pole puncture under fluoroscopy, access was gained to the collecting system allowing placement of a 5 French catheter followed by the nephrostomy tube. The nephrostomy tube is  formed in the renal pelvis. There was bleeding in the collecting system at the time of tube placement with clot present in the collecting system with contrast injection. IMPRESSION: Right-sided percutaneous nephrostomy tube placement with placement of 10 French catheter formed in the renal pelvis. There was some bleeding at the time of tube placement with clot formation in the collecting system. The tube will be flushed and irrigated. The nephrostomy tube was connected to a gravity drainage bag. Electronically Signed   By: Irish Lack M.D.   On: 10/10/2020 16:50    Labs:  CBC: Recent Labs    10/11/20 0144 10/11/20 0144 10/11/20 1415 10/12/20 0440 10/12/20 1319 10/13/20 0801  WBC 11.6*  --   --  SPECIMEN CLOTTED 9.0 10.4  HGB 12.3  --   --  SPECIMEN CLOTTED 11.1* 10.6*  HCT 38.0  --   --  SPECIMEN CLOTTED 33.7* 32.7*  PLT 32*   < > 23* SPECIMEN CLOTTED 23* 26*   < > = values in this interval not displayed.    COAGS: Recent Labs    10/09/20 1750 10/10/20 0133 10/11/20 1415  INR 1.5* 1.4* 1.3*  APTT 37*  --  32    BMP: Recent Labs    10/10/20 0429 10/10/20 1553 10/11/20 0144 10/12/20 0440  NA 139 137 139 138  K 4.9 4.7 5.1 4.1  CL 104 103 103 105  CO2 13* 18* 19* 23  GLUCOSE 120* 146* 156* 152*  BUN 44* 49* 57* 63*  CALCIUM 8.4* 8.2* 8.1* 8.5*  CREATININE 3.25* 2.97* 2.95* 1.94*  GFRNONAA 16* 17* 18* 29*    LIVER FUNCTION TESTS: Recent Labs    10/09/20 1958 10/10/20 0429 10/11/20 0144  BILITOT 0.8 1.0 1.1  AST 43* 49* 39  ALT 30 33 30  ALKPHOS 51 65 158*  PROT 5.4* 6.1* 6.1*  ALBUMIN 2.3* 2.6* 2.4*    Assessment and Plan:  History of sepsis and right hydronephrosis secondary to obstructing proximal right ureteral calculus s/p right percutaneous nephrostomy tube placement in IR 10/10/2020. Right nephrostomy tube stable with minimal amounts of clear yellow urine with bloody debris (additional 310 cc output from drain in past 24 hours per  chart). Continue current drain management- continue with Qshift flushes/monitor of output. Tube to be managed by urology on outpatient basis. Further plans per CCM/urology- appreciate and agree with management. IR to follow.   Electronically Signed: Elwin MochaAlexandra Laini Urick, PA-C 10/13/2020, 9:04 AM   I spent a total of 25 Minutes at the the patient's bedside AND on the patient's hospital floor or unit, greater than 50% of which was counseling/coordinating care for right hydronephrosis s/p right nephrostomy tube placement.

## 2020-10-13 NOTE — Progress Notes (Signed)
NAME:  Shirley Sullivan, MRN:  [id number], DOB:  [birth year], LOS: 2 ADMISSION DATE:  10/09/2020, CONSULTATION DATE:  10/09/2020 REFERRING MD: [place] Robinson, PA, CHIEF COMPLAINT:  Septic shock  Brief History   59 year old female presenting with abdominal/ flank pain, UTI symptoms, N/V/D, and intermittent chest pain since passing a kidney stone on 11/4.  Found to be in septic shock with UTI and obstructing right ureteral stone with mild hydronephrosis with AKI requiring vasopressor support.   History of present illness   59 year old female with DM, asthma, nephrolithiasis, lupus (no flare in last 5 years/ not on home, depression/ anxiety, arthritis, and COVID-19 (positive on 08/15/2020) presenting to ER with complaints of not feeling well since Thursday.  She thinks she passed a kidney stone on the right and since has continued to fell bad with suprapubic abdominal pain, bilateral flank pain, back pain, nausea and vomiting, decreased urinary output, dysuria, some confusion, generalized weakness, diarrhea, complaints of feeling hot, intermittent midsternum chest pain, and mild shortness of breath.   In ER, she was afebrile, tachycardic 120, hypotensive 75/59, and 93% on room air.   Labs noted for normal WBC, platelets 42, lactic 9.6-> 7.7, CO2 15, glucose 110, BUN 38, sCr 3.49, AG 18, albumin 2.3, AST 43, t. Protein 5.4, BNP 1372, troponin hs 429, INR 1.5, PT 17, UA with large Hgb, trace leukocytes, positive nitrates, protein 100, high SG, many bacteria, RBC > 50, > 50 WBC. EKG unchanged from prior.  CXR showed mild pulmonary vascular congestion, and given back and chest pain with concern for dissection, CTA chest/ abd/ pelvis was negative for dissection or aneurysm, but showed an obstructing 6 mm stone in the proximal right ureter with mild right sided hydroureteronephrosis; also noted for hepatosplenomegaly with likely underlying cirrhosis and portal hypertension.  She received a total of 3L of fluids with  persistent hypotension, therefore levophed was started.  Urology and IR were consulted.  At this time, IR is not recommending percutaneous nephrostomy tube due to mild hydronephrosis and body habitus.  PCCM called for admission.   Past Medical History  DM, asthma, nephrolithiasis, lupus, depression/ anxiety, COVID 19 (positive on 08/15/2020), arthritis, previous lap band placement, Non smoker, non drinker  Significant Hospital Events   11/7 admitted  11/8 IR perc nephrostomy tube  Consults:  Urology IR   Procedures:    Significant Diagnostic Tests:  10/09/2020 CTA chest/ abd/ pelvis >> 1. No evidence for aortic dissection or aneurysm. 2. Mild right-sided hydroureteronephrosis secondary to an obstructing 6 mm stone in the proximal right ureter. 3. Streaky, linear airspace opacities throughout both lung fields favored to represent atelectasis. 4. 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 5. There is a gastric band in place. 6. Rectosigmoid diverticulosis without acute inflammation.  11/8 Echo>>EF 55-60%, There is mildly elevated pulmonary artery systolic pressure.  Micro Data:  11/7 BCx2 >>gram neg rodsx2>>Enterobacterales, Enterobacter cloacae, Klebsiella oxytoca>>resistant to  11/7 UC >>100,000 gm neg rods>>resistant to cefazolin and ampicillin, intermediate nitrofurantoin 11/7 SARS 2 >> positive, Flu >> neg  Antimicrobials:  Ceftriaxone 11/7-11/8 Cefepime 11/8-  Interim history/subjective:  No AM labs for review  Remains off pressors Husband at bedside and feels that patient is significantly more alert this morning than previously   Objective   Blood pressure 116/73, pulse 94, temperature 98.8 F (37.1 C), temperature source Oral, resp. rate (!) 25, height 5\' 4"  (1.626 m), weight 88.5 kg, SpO2 98 %.  Intake/Output Summary (Last 24 hours) at 10/11/2020 0734 Last data filed at 10/11/2020 6283 Gross per 24 hour  Intake 3245.06 ml    Output 1026 ml  Net 2219.06 ml   Filed Weights   10/09/20 1733 10/11/20 0253  Weight: 88.5 kg 88.5 kg   Examination: General:  Obese middle aged appearing F, reclined in bed NAD  HEENT: NCAT pink mmm anicteric sclera Neuro: AAOx3 following commands, PERRLA CV: rrr s1s2 no rgm PULM: 1LNC, CTAb, even, unlabored respirations  GI: Obese soft round ndnt Extremities: No obvious joint deformity. No cyanosis or clubbing  Skin: c/d/w without rash  Psych: appropriate for age and situation. Calm, engaged   Resolved Hospital Problem list   Shock AGMA  Assessment & Plan:   Acute Encephalopathy, improving  -in setting of severe sepsis, AKI, CNS depressing medications  P: -delirium precautions -minimize CNS depressing agents as able -cont abx for sepsis   Hypoxia, mild - atelectasis noted on CTA Hx COVID-19 infection -positive 9/13 P:  -IS, pulm hygiene -Supplemental O2 as needed for SpO2 > 92%  Septic shock and Enterobacter/Klebsiella bacteremia secondary to UTI, pyelonephritis and obstructing R 12mm stone, s/p L nephrostomy tube by IR 11/8-- improved shock Shock improving and off pressors, Klebsiella and Enterobacter sensitive to cefepime.   P:  -nephrostomy tube care per IR -appreciate uro recs-- will need definitive stone tx after discharge, following improvement of acute infection -cont abx for 10-14d course  -PRN oxy and ultram for pain management   Acute Kidney Injury -likely obstructive in setting of obstructive nephrolithiasis, possible pre-renal component  P: -f/u CMP 11/11 when results  -minimize nephrotoxic agents  -trend UOP   Thrombocytopenia - suspect related to gram negative bacteremia vs underlying liver disease/ hepatic congestion -DIC panel with D-dimer 7.01 but normal PTT, INR and elevated fibrinogen.  P - f/u CBC when results 11/11 - ADAMS-13 pending - Transfuse for platelets <10k or signs of bleeding - hold Asa, chemical vte ppx   Elevated  troponin  Chest pain resolved Echo reassuring P:  -suspect demand ischemia related to critical illness -holding Asa secondary to thrombocytopenia  Hepatosplenomegaly  with possibly underlying cirrhosis and portal hypertension, denies ETOH P:  -CMP pending 11/11 -Will need further GI outpatient workup  DM P:  -Trend CBG q 4, add SSI prn  Best practice:  Diet: clears  Pain/Anxiety/Delirium protocol (if indicated): oxycodone, ultram  VAP protocol (if indicated): n/a DVT prophylaxis: SCDs given thrombocytopenia  GI prophylaxis: n/a Glucose control: CBG q 4 Mobility: will consult PT  Code Status: full  Family Communication: pt and husband updated 11/11 Disposition: likely nearing ability to transfer out of ICU   Labs   CBC: Recent Labs  Lab 10/09/20 1750 10/10/20 0133 10/10/20 1553 10/10/20 1752  WBC 6.6 14.6* 2.1*  --   NEUTROABS 5.3  --   --   --   HGB 12.5 12.1 12.6 11.2*  HCT 39.1 37.5 40.1 34.4*  MCV 87.7 85.6 87.6  --   PLT 42* 43* 34*  --     Basic Metabolic Panel: Recent Labs  Lab 10/09/20 1958 10/10/20 0429 10/10/20 1553  NA 136 139 137  K 4.0 4.9 4.7  CL 103 104 103  CO2 15* 13* 18*  GLUCOSE 110* 120* 146*  BUN 38* 44* 49*  CREATININE 3.49* 3.25* 2.97*  CALCIUM 8.0* 8.4* 8.2*   GFR: Estimated Creatinine Clearance: 21.4 mL/min (A) (by C-G formula based on SCr of 2.97 mg/dL (H)). Recent Labs  Lab 10/09/20 1750 10/09/20 1958 10/10/20 0126 10/10/20 0133 10/10/20 0428 10/10/20 1007 10/10/20 1553 10/11/20 0144  WBC 6.6  --   --  14.6*  --   --  2.1*  --   LATICACIDVEN 9.6*   < > 6.5*  --  5.5* 5.3*  --  5.0*   < > = values in this interval not displayed.    Liver Function Tests: Recent Labs  Lab 10/09/20 1958 10/10/20 0429  AST 43* 49*  ALT 30 33  ALKPHOS 51 65  BILITOT 0.8 1.0  PROT 5.4* 6.1*  ALBUMIN 2.3* 2.6*   No results for input(s): LIPASE, AMYLASE in the last 168 hours. No results for input(s): AMMONIA in the last 168  hours.  ABG No results found for: PHART, PCO2ART, PO2ART, HCO3, TCO2, ACIDBASEDEF, O2SAT   Coagulation Profile: Recent Labs  Lab 10/09/20 1750 10/10/20 0133  INR 1.5* 1.4*    Cardiac Enzymes: No results for input(s): CKTOTAL, CKMB, CKMBINDEX, TROPONINI in the last 168 hours.  HbA1C: Hgb A1c MFr Bld  Date/Time Value Ref Range Status  10/10/2020 01:33 AM 5.8 (H) 4.8 - 5.6 % Final    Comment:    (NOTE) Pre diabetes:          5.7%-6.4%  Diabetes:              >6.4%  Glycemic control for   <7.0% adults with diabetes     CBG: Recent Labs  Lab 10/10/20 1134 10/10/20 1553 10/10/20 1950 10/10/20 2349 10/11/20 0342  GLUCAP 118* 131* 71 112* 150*    CCT: n/a    Tessie Fass MSN, AGACNP-BC [facility] [phone] If no answer, [phone] 10/13/2020, 7:21 AM

## 2020-10-13 NOTE — Progress Notes (Signed)
CRITICAL VALUE ALERT  Critical Value: Platelets 26  Date & Time Notied:  10/13/20 0850 * Provider Notified: Delorise Shiner  Orders Received/Actions taken:none improving values

## 2020-10-13 NOTE — Progress Notes (Signed)
Patient ID: Shirley Sullivan, female   DOB: [birth year], 59 y.o.   MRN: [id number]    Subjective: No changes.  Objective: Vital signs in last 24 hours: Temp:  [97.8 F (36.6 C)-98.8 F (37.1 C)] 98 F (36.7 C) (11/11 0340) Pulse Rate:  [81-94] 83 (11/11 0600) Resp:  [15-22] 20 (11/11 0600) BP: (101-120)/(64-84) 110/73 (11/11 0600) SpO2:  [92 %-98 %] 97 % (11/11 0600) Weight:  [91.3 kg] 91.3 kg (11/11 0500)  Intake/Output from previous day: 11/10 0701 - 11/11 0700 In: 873 [P.O.:458; IV Piggyback:100] Out: 2155 [Urine:2155]  R PCN 360 cc Intake/Output this shift: No intake/output data recorded.    Lab Results: Recent Labs    10/11/20 0144 10/12/20 0440 10/12/20 1319  HGB 12.3 SPECIMEN CLOTTED 11.1*  HCT 38.0 SPECIMEN CLOTTED 33.7*   CBC Latest Ref Rng & Units 10/12/2020 10/12/2020 10/11/2020  WBC 4.0 - 10.5 K/uL 9.0 SPECIMEN CLOTTED -  Hemoglobin 12.0 - 15.0 g/dL 11.1(L) SPECIMEN CLOTTED -  Hematocrit 36 - 46 % 33.7(L) SPECIMEN CLOTTED -  Platelets 150 - 400 K/uL 23(LL) SPECIMEN CLOTTED 23(LL)     BMET Recent Labs    10/11/20 0144 10/12/20 0440  NA 139 138  K 5.1 4.1  CL 103 105  CO2 19* 23  GLUCOSE 156* 152*  BUN 57* 63*  CREATININE 2.95* 1.94*  CALCIUM 8.1* 8.5*     Studies/Results:  10/12/2020 FINAL   Organism ID, Bacteria ENTEROBACTER CLOACAEAbnormal   Organism ID, Bacteria KLEBSIELLA OXYTOCAAbnormal   Resulting Agency [facility]  Susceptibility   Enterobacter cloacae Klebsiella oxytoca    MIC MIC    AMPICILLIN   RESISTANT  Resistant    AMPICILLIN/SULBACTAM   4 SENSITIVE  Sensitive    CEFAZOLIN >=64 RESIST... Resistant <=4 SENSITIVE  Sensitive    CEFEPIME <=0.12 SENS... Sensitive <=0.12 SENS... Sensitive    CEFTRIAXONE   <=0.25 SENS... Sensitive    CIPROFLOXACIN <=0.25 SENS... Sensitive <=0.25 SENS... Sensitive    GENTAMICIN <=1 SENSITIVE  Sensitive <=1 SENSITIVE  Sensitive    IMIPENEM <=0.25 SENS... Sensitive <=0.25 SENS... Sensitive     NITROFURANTOIN 64 INTERMED... Intermediate <=16 SENSIT... Sensitive    PIP/TAZO <=4 SENSITIVE  Sensitive <=4 SENSITIVE  Sensitive    TRIMETH/SULFA <=20 SENSIT... Sensitive <=20 SENSIT... Sensitive        Assessment/Plan: 1) Right ureteral stone/sepsis: Continue right PCN drainage.  Will need 10-14 days of culture specific antibiotic therapy (ciprofloxacin or TMP/SMX).  Will arrange outpatient follow up in about two weeks to assess for definitive outpatient stone treatment.   LOS: 4 days   Crecencio Mc 10/13/2020, 7:17 AM

## 2020-10-14 DIAGNOSIS — N132 Hydronephrosis with renal and ureteral calculous obstruction: Secondary | ICD-10-CM

## 2020-10-14 DIAGNOSIS — A414 Sepsis due to anaerobes: Secondary | ICD-10-CM

## 2020-10-14 LAB — CBC
HCT: 30.8 % — ABNORMAL LOW (ref 36.0–46.0)
Hemoglobin: 10.1 g/dL — ABNORMAL LOW (ref 12.0–15.0)
MCH: 28.1 pg (ref 26.0–34.0)
MCHC: 32.8 g/dL (ref 30.0–36.0)
MCV: 85.8 fL (ref 80.0–100.0)
Platelets: 59 10*3/uL — ABNORMAL LOW (ref 150–400)
RBC: 3.59 MIL/uL — ABNORMAL LOW (ref 3.87–5.11)
RDW: 15.6 % — ABNORMAL HIGH (ref 11.5–15.5)
WBC: 11.7 10*3/uL — ABNORMAL HIGH (ref 4.0–10.5)
nRBC: 0.2 % (ref 0.0–0.2)

## 2020-10-14 LAB — BASIC METABOLIC PANEL
Anion gap: 10 (ref 5–15)
BUN: 38 mg/dL — ABNORMAL HIGH (ref 8–23)
CO2: 25 mmol/L (ref 22–32)
Calcium: 8.1 mg/dL — ABNORMAL LOW (ref 8.9–10.3)
Chloride: 103 mmol/L (ref 98–111)
Creatinine, Ser: 1.04 mg/dL — ABNORMAL HIGH (ref 0.44–1.00)
GFR, Estimated: >60 mL/min (ref >60)
Glucose, Bld: 119 mg/dL — ABNORMAL HIGH (ref 70–99)
Potassium: 3.6 mmol/L (ref 3.5–5.1)
Sodium: 138 mmol/L (ref 135–145)

## 2020-10-14 LAB — GLUCOSE, CAPILLARY
Glucose-Capillary: 107 mg/dL — ABNORMAL HIGH (ref 70–99)
Glucose-Capillary: 117 mg/dL — ABNORMAL HIGH (ref 70–99)
Glucose-Capillary: 123 mg/dL — ABNORMAL HIGH (ref 70–99)
Glucose-Capillary: 131 mg/dL — ABNORMAL HIGH (ref 70–99)

## 2020-10-14 MED ORDER — INSULIN ASPART 100 UNIT/ML ~~LOC~~ SOLN
0.0000 [IU] | Freq: Three times a day (TID) | SUBCUTANEOUS | Status: DC
  Administered 2020-10-14 – 2020-10-16 (×4): 1 [IU] via SUBCUTANEOUS

## 2020-10-14 MED ORDER — SODIUM CHLORIDE 0.9 % IV SOLN: INTRAVENOUS | Status: DC

## 2020-10-14 MED ORDER — GABAPENTIN 300 MG PO CAPS
300.0000 mg | ORAL_CAPSULE | Freq: Two times a day (BID) | ORAL | Status: DC
  Administered 2020-10-14 – 2020-10-16 (×5): 300 mg via ORAL
  Filled 2020-10-14 (×5): qty 1

## 2020-10-14 NOTE — Progress Notes (Signed)
PROGRESS NOTE    MALIKA DEMARIO   ENI:[id number]  DOB: 03/20/1961  DOA: 10/09/2020 PCP: Patient, No Pcp Per   Brief Narrative:  LAKECHIA NAY is a 59 y/o F with asthma, SLE, DM 2 , anxiety/depression, gastric banding, nephrolithiasis who presented to the ED with abdominal pain, flank pain,, nausea vomiting, frequency, urgency, dysuria and near syncope. She passed a renal stone on 11/4.  Of note she was positive for COVID 19 on 9/13. In ED > right 6 mm obstructing ureteral stone with mild hydronephrosis, tachycardic in 130s, hypotensive (75/59) requiring IVF boluses and then pressors.  Cr 3.49 (Cr~ 0.5- 0.7 in 2018) Lactic acid 7.7 Troponin 429 Plt 42 UA consistent with infection. Admitted to ICU with urology and consults. 11/8 IR placed nephrostomy tube  Subjective: Feeling very weak. No appetite and mild nausea. Tolerating clears but does not want solid food.     Assessment & Plan:   Principal Problem:   Septic shock POA due to K Oxytoca and E Cloacae bacteremia and UTI - shock resolved - sensitivities reviewed- she is on Cipro 500 BID and stable- will need 10-14 days of antibiotics - cont Nephrostomy drain with Q shift flushed- teach husband how to do this - will f/u with Dr Laverle Patter as outpatient  Active Problems:   Acute renal failure (HCC)   - related to obstruction and sepsis -  Cr 3.49 >> Cr improving and down to 1.04  Elevated Troponin/ NSTEMI - High sensitivity T max 898 - likely due to septic shock - ECHO 10/10/20 > EF 55-60%, no d CHF, mild pulm HTN  Nausea and vomiting - due to shock/ infection - poor oral intake- resume IVF at 75 cc/hr - advance to full liquids and follow oral intake  Severe thrombocytopenia - Plt up to 26 yesterday- CBC from today still in process- nadir was 23 on 11/11 - DIC panel negative - avoid anticoagulants  Anemia, normocytic -  Hb 12.5 on admission - now 10.6- drop may be dilutional  Acute metabolic encephalopathy - due to  septic shock - improved to baseline  DM 2, controlled - A1c 5.8 on 11/8 - she states she takes Metformin at home - sugars well controlled in the hospital - cont SSI   Neuropathy - states right first toe are is painful - cont Neurontin  SLE - not on any current treatment  Depression/anxiety - Xanax, Zoloft resumed  Hepatosplenomegaly - noted on CT imaging - ? Due to NASH- denies ETOH - will need outpt f/u with GI  Obesity Body mass index is 34.55 kg/m. - s/p gastric banding    Time spent in minutes: 35 min  DVT prophylaxis: SCDs Start: 10/09/20 2156  Code Status: Full code Family Communication: husband at bedside Disposition Plan:  Status is: Inpatient  Remains inpatient appropriate because:need to advance diet and ensure platelets continue to improve.    Dispo: The patient is from: Home              Anticipated d/c is to: Home              Anticipated d/c date is: 1 day              Patient currently is not medically stable to d/c.      Consultants:   Urology  IR Procedures:   IR guided nephrostomy tube Antimicrobials:  Anti-infectives (From admission, onward)   Start     Dose/Rate Route Frequency Ordered Stop  10/13/20 1130  ciprofloxacin (CIPRO) tablet 500 mg        500 mg Oral 2 times daily 10/13/20 1032     10/10/20 1745  cefTRIAXone (ROCEPHIN) 2 g in sodium chloride 0.9 % 100 mL IVPB  Status:  Discontinued        2 g 200 mL/hr over 30 Minutes Intravenous Every 24 hours 10/09/20 2207 10/10/20 0820   10/10/20 1422  ceFAZolin (ANCEF) 2-4 GM/100ML-% IVPB       Note to Pharmacy: Theressa Stamps   : cabinet override      10/10/20 1422 10/10/20 1500   10/10/20 0820  ceFEPIme (MAXIPIME) 2 g in sodium chloride 0.9 % 100 mL IVPB  Status:  Discontinued        2 g 200 mL/hr over 30 Minutes Intravenous Every 24 hours 10/10/20 0820 10/13/20 1032   10/09/20 2215  cefTRIAXone (ROCEPHIN) 1 g in sodium chloride 0.9 % 100 mL IVPB        1 g 200 mL/hr over  30 Minutes Intravenous STAT 10/09/20 2207 10/09/20 2319   10/09/20 1745  cefTRIAXone (ROCEPHIN) 1 g in sodium chloride 0.9 % 100 mL IVPB  Status:  Discontinued        1 g 200 mL/hr over 30 Minutes Intravenous Every 24 hours 10/09/20 1744 10/09/20 2207       Objective: Vitals:   10/13/20 1941 10/13/20 2341 10/14/20 0339 10/14/20 0900  BP: 119/66 123/69 124/64 118/67  Pulse: 87 90 91 80  Resp: 17 17 18    Temp: 98.2 F (36.8 C) 98.4 F (36.9 C) 97.9 F (36.6 C) 98.7 F (37.1 C)  TempSrc: Oral Oral  Oral  SpO2: 97% 94% 97% 95%  Weight:      Height:        Intake/Output Summary (Last 24 hours) at 10/14/2020 1250 Last data filed at 10/14/2020 1156 Gross per 24 hour  Intake 5 ml  Output 550 ml  Net -545 ml   Filed Weights   10/11/20 0253 10/12/20 0500 10/13/20 0500  Weight: 88.5 kg 89.7 kg 91.3 kg    Examination: General exam: Appears comfortable  HEENT: PERRLA, oral mucosa moist, no sclera icterus or thrush Respiratory system: Clear to auscultation. Respiratory effort normal. Cardiovascular system: S1 & S2 heard, RRR.   Gastrointestinal system: Abdomen soft, non-tender, nondistended. Normal bowel sounds. Central nervous system: Alert and oriented. No focal neurological deficits. Extremities: No cyanosis, clubbing or edema Skin: No rashes or ulcers Psychiatry:  Mood & affect appropriate.     Data Reviewed: I have personally reviewed following labs and imaging studies  CBC: Recent Labs  Lab 10/09/20 1750 10/10/20 0133 10/10/20 1553 10/10/20 1553 10/10/20 1752 10/11/20 0144 10/11/20 1415 10/12/20 0440 10/12/20 1319 10/13/20 0801  WBC 6.6   < > 2.1*  --   --  11.6*  --  SPECIMEN CLOTTED 9.0 10.4  NEUTROABS 5.3  --   --   --   --   --   --   --  7.5  --   HGB 12.5   < > 12.6   < > 11.2* 12.3  --  SPECIMEN CLOTTED 11.1* 10.6*  HCT 39.1   < > 40.1   < > 34.4* 38.0  --  SPECIMEN CLOTTED 33.7* 32.7*  MCV 87.7   < > 87.6  --   --  87.4  --  SPECIMEN CLOTTED 84.7  84.3  PLT 42*   < > 34*   < >  --  32* 23* SPECIMEN CLOTTED 23* 26*   < > = values in this interval not displayed.   Basic Metabolic Panel: Recent Labs  Lab 10/10/20 1553 10/11/20 0144 10/11/20 1415 10/12/20 0440 10/13/20 0801 10/14/20 0834  NA 137 139  --  138 140 138  K 4.7 5.1  --  4.1 3.7 3.6  CL 103 103  --  105 104 103  CO2 18* 19*  --  GLUCOSE 146* 156*  --  152* 124* 119*  BUN 49* 57*  --  63* 50* 38*  CREATININE 2.97* 2.95*  --  1.94* 1.28* 1.04*  CALCIUM 8.2* 8.1*  --  8.5* 8.3* 8.1*  MG  --   --  1.6* 2.4  --   --    GFR: Estimated Creatinine Clearance: 62.1 mL/min (A) (by C-G formula based on SCr of 1.04 mg/dL (H)). Liver Function Tests: Recent Labs  Lab 10/09/20 1958 10/10/20 0429 10/11/20 0144 10/13/20 0801  AST 43* 49* 39 31  ALT 30 33 30 21  ALKPHOS 51 65 158* 130*  BILITOT 0.8 1.0 1.1 1.1  PROT 5.4* 6.1* 6.1* 5.9*  ALBUMIN 2.3* 2.6* 2.4* 1.9*   No results for input(s): LIPASE, AMYLASE in the last 168 hours. Recent Labs  Lab 10/11/20 0749  AMMONIA 32   Coagulation Profile: Recent Labs  Lab 10/09/20 1750 10/10/20 0133 10/11/20 1415  INR 1.5* 1.4* 1.3*   Cardiac Enzymes: No results for input(s): CKTOTAL, CKMB, CKMBINDEX, TROPONINI in the last 168 hours. BNP (last 3 results) No results for input(s): PROBNP in the last 8760 hours. HbA1C: No results for input(s): HGBA1C in the last 72 hours. CBG: Recent Labs  Lab 10/13/20 1153 10/13/20 1657 10/14/20 0036 10/14/20 0359 10/14/20 1213  GLUCAP 118* 117* 117* 123* 107*   Lipid Profile: No results for input(s): CHOL, HDL, LDLCALC, TRIG, CHOLHDL, LDLDIRECT in the last 72 hours. Thyroid Function Tests: No results for input(s): TSH, T4TOTAL, FREET4, T3FREE, THYROIDAB in the last 72 hours. Anemia Panel: No results for input(s): VITAMINB12, FOLATE, FERRITIN, TIBC, IRON, RETICCTPCT in the last 72 hours. Urine analysis:    Component Value Date/Time   COLORURINE AMBER (A) 10/09/2020  2050   APPEARANCEUR CLOUDY (A) 10/09/2020 2050   LABSPEC 1.031 (H) 10/09/2020 2050   PHURINE 5.0 10/09/2020 2050   GLUCOSEU NEGATIVE 10/09/2020 2050   HGBUR LARGE (A) 10/09/2020 2050   BILIRUBINUR NEGATIVE 10/09/2020 2050   KETONESUR NEGATIVE 10/09/2020 2050   PROTEINUR 100 (A) 10/09/2020 2050   UROBILINOGEN 1.0 01/24/2015 0839   NITRITE POSITIVE (A) 10/09/2020 2050   LEUKOCYTESUR TRACE (A) 10/09/2020 2050   Sepsis Labs: (procalcitonin:4,lacticidven:4) ) Recent Results (from the past 240 hour(s))  Blood Culture (routine x 2)     Status: Abnormal   Collection Time: 10/09/20  5:48 PM   Specimen: BLOOD LEFT HAND  Result Value Ref Range Status   Specimen Description BLOOD LEFT HAND  Final   Special Requests   Final    BOTTLES DRAWN AEROBIC AND ANAEROBIC Blood Culture results may not be optimal due to an inadequate volume of blood received in culture bottles   Culture  Setup Time   Final    GRAM NEGATIVE RODS IN BOTH AEROBIC AND ANAEROBIC BOTTLES CRITICAL VALUE NOTED.  VALUE IS CONSISTENT WITH PREVIOUSLY REPORTED AND CALLED VALUE. Performed at [facility], [address], [place], [place] [address]    Culture ENTEROBACTER CLOACAE KLEBSIELLA OXYTOCA  (A)  Final   Report Status  10/13/2020 FINAL  Final   Organism ID, Bacteria ENTEROBACTER CLOACAE  Final   Organism ID, Bacteria KLEBSIELLA OXYTOCA  Final      Susceptibility   Enterobacter cloacae - MIC*    CEFAZOLIN >=64 RESISTANT Resistant     CEFEPIME <=0.12 SENSITIVE Sensitive     CEFTAZIDIME <=1 SENSITIVE Sensitive     CIPROFLOXACIN <=0.25 SENSITIVE Sensitive     GENTAMICIN <=1 SENSITIVE Sensitive     IMIPENEM <=0.25 SENSITIVE Sensitive     TRIMETH/SULFA <=20 SENSITIVE Sensitive     PIP/TAZO <=4 SENSITIVE Sensitive     * ENTEROBACTER CLOACAE   Klebsiella oxytoca - MIC*    AMPICILLIN RESISTANT Resistant     CEFAZOLIN <=4 SENSITIVE Sensitive     CEFEPIME <=0.12 SENSITIVE Sensitive     CEFTAZIDIME <=1  SENSITIVE Sensitive     CEFTRIAXONE <=0.25 SENSITIVE Sensitive     CIPROFLOXACIN <=0.25 SENSITIVE Sensitive     GENTAMICIN <=1 SENSITIVE Sensitive     IMIPENEM <=0.25 SENSITIVE Sensitive     TRIMETH/SULFA <=20 SENSITIVE Sensitive     AMPICILLIN/SULBACTAM 4 SENSITIVE Sensitive     PIP/TAZO <=4 SENSITIVE Sensitive     * KLEBSIELLA OXYTOCA  Blood Culture (routine x 2)     Status: Abnormal   Collection Time: 10/09/20  5:50 PM   Specimen: BLOOD RIGHT FOREARM  Result Value Ref Range Status   Specimen Description BLOOD RIGHT FOREARM  Final   Special Requests   Final    BOTTLES DRAWN AEROBIC AND ANAEROBIC Blood Culture results may not be optimal due to an inadequate volume of blood received in culture bottles   Culture  Setup Time   Final    GRAM NEGATIVE RODS IN BOTH AEROBIC AND ANAEROBIC BOTTLES Organism ID to follow CRITICAL RESULT CALLED TO, READ BACK BY AND VERIFIED WITH: Gay Filler PharmD 9:30 10/10/20 ([email or web address])    Culture (A)  Final    KLEBSIELLA OXYTOCA ENTEROBACTER CLOACAE SUSCEPTIBILITIES PERFORMED ON PREVIOUS CULTURE WITHIN THE LAST 5 DAYS. Performed at [facility], [address] [place], [place] [address]    Report Status 10/13/2020 FINAL  Final  Blood Culture ID Panel (Reflexed)     Status: Abnormal   Collection Time: 10/09/20  5:50 PM  Result Value Ref Range Status   Enterococcus faecalis NOT DETECTED NOT DETECTED Final   Enterococcus Faecium NOT DETECTED NOT DETECTED Final   Listeria monocytogenes NOT DETECTED NOT DETECTED Final   Staphylococcus species NOT DETECTED NOT DETECTED Final   Staphylococcus aureus (BCID) NOT DETECTED NOT DETECTED Final   Staphylococcus epidermidis NOT DETECTED NOT DETECTED Final   Staphylococcus lugdunensis NOT DETECTED NOT DETECTED Final   Streptococcus species NOT DETECTED NOT DETECTED Final   Streptococcus agalactiae NOT DETECTED NOT DETECTED Final   Streptococcus pneumoniae NOT DETECTED NOT DETECTED Final   Streptococcus pyogenes  NOT DETECTED NOT DETECTED Final   A.calcoaceticus-baumannii NOT DETECTED NOT DETECTED Final   Bacteroides fragilis NOT DETECTED NOT DETECTED Final   Enterobacterales DETECTED (A) NOT DETECTED Final    Comment: CRITICAL RESULT CALLED TO, READ BACK BY AND VERIFIED WITH: Gay Filler PharmD 9:30 10/10/20 ([email or web address])    Enterobacter cloacae complex DETECTED (A) NOT DETECTED Final    Comment: CRITICAL RESULT CALLED TO, READ BACK BY AND VERIFIED WITH: Gay Filler PharmD 9:30 10/10/20 ([email or web address])    Escherichia coli NOT DETECTED NOT DETECTED Final   Klebsiella aerogenes NOT DETECTED NOT DETECTED Final   Klebsiella oxytoca DETECTED (A) NOT DETECTED Final    Comment:  CRITICAL RESULT CALLED TO, READ BACK BY AND VERIFIED WITH: Gay Filler PharmD 9:30 10/10/20 ([email or web address])    Klebsiella pneumoniae NOT DETECTED NOT DETECTED Final   Proteus species NOT DETECTED NOT DETECTED Final   Salmonella species NOT DETECTED NOT DETECTED Final   Serratia marcescens NOT DETECTED NOT DETECTED Final   Haemophilus influenzae NOT DETECTED NOT DETECTED Final   Neisseria meningitidis NOT DETECTED NOT DETECTED Final   Pseudomonas aeruginosa NOT DETECTED NOT DETECTED Final   Stenotrophomonas maltophilia NOT DETECTED NOT DETECTED Final   Candida albicans NOT DETECTED NOT DETECTED Final   Candida auris NOT DETECTED NOT DETECTED Final   Candida glabrata NOT DETECTED NOT DETECTED Final   Candida krusei NOT DETECTED NOT DETECTED Final   Candida parapsilosis NOT DETECTED NOT DETECTED Final   Candida tropicalis NOT DETECTED NOT DETECTED Final   Cryptococcus neoformans/gattii NOT DETECTED NOT DETECTED Final   CTX-M ESBL NOT DETECTED NOT DETECTED Final   Carbapenem resistance IMP NOT DETECTED NOT DETECTED Final   Carbapenem resistance KPC NOT DETECTED NOT DETECTED Final   Carbapenem resistance NDM NOT DETECTED NOT DETECTED Final   Carbapenem resist OXA 48 LIKE NOT DETECTED NOT DETECTED Final   Carbapenem resistance VIM NOT DETECTED NOT DETECTED  Final    Comment: Performed at [facility], [address] [place], [place] [address]  Urine culture     Status: Abnormal   Collection Time: 10/09/20  8:50 PM   Specimen: In/Out Cath Urine  Result Value Ref Range Status   Specimen Description IN/OUT CATH URINE  Final   Special Requests   Final    NONE Performed at [facility], [address], [place], [place] [address]    Culture (A)  Final    >=100,000 COLONIES/mL ENTEROBACTER CLOACAE >=100,000 COLONIES/mL KLEBSIELLA OXYTOCA    Report Status 10/12/2020 FINAL  Final   Organism ID, Bacteria ENTEROBACTER CLOACAE (A)  Final   Organism ID, Bacteria KLEBSIELLA OXYTOCA (A)  Final      Susceptibility   Enterobacter cloacae - MIC*    CEFAZOLIN >=64 RESISTANT Resistant     CEFEPIME <=0.12 SENSITIVE Sensitive     CIPROFLOXACIN <=0.25 SENSITIVE Sensitive     GENTAMICIN <=1 SENSITIVE Sensitive     IMIPENEM <=0.25 SENSITIVE Sensitive     NITROFURANTOIN 64 INTERMEDIATE Intermediate     TRIMETH/SULFA <=20 SENSITIVE Sensitive     PIP/TAZO <=4 SENSITIVE Sensitive     * >=100,000 COLONIES/mL ENTEROBACTER CLOACAE   Klebsiella oxytoca - MIC*    AMPICILLIN RESISTANT Resistant     CEFAZOLIN <=4 SENSITIVE Sensitive     CEFEPIME <=0.12 SENSITIVE Sensitive     CEFTRIAXONE <=0.25 SENSITIVE Sensitive     CIPROFLOXACIN <=0.25 SENSITIVE Sensitive     GENTAMICIN <=1 SENSITIVE Sensitive     IMIPENEM <=0.25 SENSITIVE Sensitive     NITROFURANTOIN <=16 SENSITIVE Sensitive     TRIMETH/SULFA <=20 SENSITIVE Sensitive     AMPICILLIN/SULBACTAM 4 SENSITIVE Sensitive     PIP/TAZO <=4 SENSITIVE Sensitive     * >=100,000 COLONIES/mL KLEBSIELLA OXYTOCA  Respiratory Panel by RT PCR (Flu A&B, Covid) - Nasopharyngeal Swab     Status: Abnormal   Collection Time: 10/09/20  9:01 PM   Specimen: Nasopharyngeal Swab  Result Value Ref Range Status   SARS Coronavirus 2 by RT PCR POSITIVE (A) NEGATIVE Final    Comment: RESULT CALLED TO, READ BACK BY AND  VERIFIED WITH: A. DAVISON,RN 2228 10/09/2020 T. TYSOR (NOTE) SARS-CoV-2 target nucleic acids are  DETECTED.  SARS-CoV-2 RNA is generally detectable in upper respiratory specimens  during the acute phase of infection. Positive results are indicative of the presence of the identified virus, but do not rule out bacterial infection or co-infection with other pathogens not detected by the test. Clinical correlation with patient history and other diagnostic information is necessary to determine patient infection status. The expected result is Negative.  Fact Sheet for Patients:  https://www.moore.com/https://www.fda.gov/media/142436/download  Fact Sheet for Healthcare Providers: https://www.young.biz/https://www.fda.gov/media/142435/download  This test is not yet approved or cleared by the [place] FDA and  has been authorized for detection and/or diagnosis of SARS-CoV-2 by FDA under an Emergency Use Authorization (EUA).  This EUA will remain in effect (meaning this test can  be used) for the duration of  the COVID-19 declaration under Section 564(b)(1) of the Act, 21 U.S.C. section 360bbb-3(b)(1), unless the authorization is terminated or revoked sooner.      Influenza A by PCR NEGATIVE NEGATIVE Final   Influenza B by PCR NEGATIVE NEGATIVE Final    Comment: (NOTE) The Xpert Xpress SARS-CoV-2/FLU/RSV assay is intended as an aid in  the diagnosis of influenza from Nasopharyngeal swab specimens and  should not be used as a sole basis for treatment. Nasal washings and  aspirates are unacceptable for Xpert Xpress SARS-CoV-2/FLU/RSV  testing.  Fact Sheet for Patients: https://www.moore.com/https://www.fda.gov/media/142436/download  Fact Sheet for Healthcare Providers: https://www.young.biz/https://www.fda.gov/media/142435/download  This test is not yet approved or cleared by the [place] FDA and  has been authorized for detection and/or diagnosis of SARS-CoV-2 by  FDA under an Emergency Use Authorization (EUA). This EUA will remain  in effect (meaning this  test can be used) for the duration of the  Covid-19 declaration under Section 564(b)(1) of the Act, 21  U.S.C. section 360bbb-3(b)(1), unless the authorization is  terminated or revoked. Performed at [facility], [address] [place], [place] [address]   MRSA PCR Screening     Status: None   Collection Time: 10/10/20 12:30 AM   Specimen: Nasal Mucosa; Nasopharyngeal  Result Value Ref Range Status   MRSA by PCR NEGATIVE NEGATIVE Final    Comment:        The GeneXpert MRSA Assay (FDA approved for NASAL specimens only), is one component of a comprehensive MRSA colonization surveillance program. It is not intended to diagnose MRSA infection nor to guide or monitor treatment for MRSA infections. Performed at [facility], [address] [place], [place] [address]   C Difficile Quick Screen w PCR reflex     Status: None   Collection Time: 10/10/20  8:08 AM   Specimen: STOOL  Result Value Ref Range Status   C Diff antigen NEGATIVE NEGATIVE Final   C Diff toxin NEGATIVE NEGATIVE Final   C Diff interpretation No C. difficile detected.  Final    Comment: Performed at [facility], [address] [place], [place] [address]  Culture, blood (routine x 2)     Status: None (Preliminary result)   Collection Time: 10/12/20  8:02 AM   Specimen: BLOOD  Result Value Ref Range Status   Specimen Description BLOOD RIGHT ANTECUBITAL  Final   Special Requests   Final    BOTTLES DRAWN AEROBIC AND ANAEROBIC Blood Culture adequate volume   Culture   Final    NO GROWTH 2 DAYS Performed at [facility], [address] [place], [place] [address]    Report Status PENDING  Incomplete  Culture, blood (routine  x 2)     Status: None (Preliminary result)   Collection Time: 10/12/20  8:05 AM   Specimen: BLOOD  Result Value Ref Range Status   Specimen Description BLOOD RIGHT ANTECUBITAL  Final   Special Requests   Final    BOTTLES DRAWN AEROBIC AND ANAEROBIC Blood Culture  adequate volume   Culture   Final    NO GROWTH 2 DAYS Performed at [facility], [address], [place], [place] [address]    Report Status PENDING  Incomplete         Radiology Studies: No results found.    Scheduled Meds: . Chlorhexidine Gluconate Cloth  6 each Topical Q0600  . ciprofloxacin  500 mg Oral BID  . insulin aspart  0-9 Units Subcutaneous TID WC  . mouth rinse  15 mL Mouth Rinse BID  . sertraline  150 mg Oral Daily  . sodium chloride flush  5 mL Intracatheter Q8H   Continuous Infusions:   LOS: 5 days      Calvert Cantor, MD [facility] Pager: [email or web address] 10/14/2020, 12:50 PM

## 2020-10-14 NOTE — Progress Notes (Signed)
Referring Physician(s): Patience Muscaemzik, Alysen (urology)  Supervising Physician: Dr. Fredia SorrowYamagata  Patient Status:  [facility] - In-pt  Subjective:  History of sepsis and right hydronephrosis secondary to obstructing proximal right ureteral calculus s/p right percutaneous nephrostomy tube placement in IR 10/10/2020. [facility].. Right nephrostomy tube site c/d/i.   Allergies: Codeine, Strawberry extract, and Vibramycin [doxycycline calcium]  Medications: Current Outpatient Medications  Medication Instructions  . albuterol (PROVENTIL HFA;VENTOLIN HFA) 108 (90 BASE) MCG/ACT inhaler 1 puff, Inhalation, Every 4 hours PRN  . ALPRAZolam (XANAX) 0.5 mg, Oral, 2 times daily PRN  . aspirin EC 81 mg, Oral, Daily PRN  . benzonatate (TESSALON) 100 mg, Oral, 2 times daily PRN  . cetirizine (ZYRTEC) 10 mg, Daily at bedtime  . gabapentin (NEURONTIN) 300 mg, Oral, 2 times daily  . HYDROcodone-acetaminophen (NORCO/VICODIN) 5-325 MG per tablet 1-2 tablets, Oral, Every 4 hours PRN  . ibuprofen (ADVIL) 400 mg, Oral, Every 6 hours PRN  . ondansetron (ZOFRAN ODT) 4 MG disintegrating tablet 4mg  ODT q4 hours prn nausea/vomit  . oxyCODONE-acetaminophen (PERCOCET) 5-325 MG tablet 2 tablets, Oral, Every 4 hours PRN  . pantoprazole (PROTONIX) 20 mg, Oral, Daily  . sertraline (ZOLOFT) 150 mg, Oral, Daily at bedtime  . tamsulosin (FLOMAX) 0.4 mg, Oral, Daily after breakfast      Vital Signs: BP 118/67 (BP Location: Left Arm)   Pulse 80   Temp 98.7 F (37.1 C) (Oral)   Resp 18   Ht 5\' 4"  (1.626 m)   Wt 91.3 kg   SpO2 95%   BMI 34.55 kg/m   Physical Exam Vitals and nursing note reviewed.  Constitutional:      General: She is not in acute distress. Pulmonary:     Effort: Pulmonary effort is normal. No respiratory distress.  Genitourinary:    Comments: Right nephrostomy tube site without tenderness, erythema, drainage, or active bleeding. Bag full  of thin blood tinged urine Skin:    General: Skin is warm and dry.  Neurological:     Mental Status: She is alert and oriented to person, place, and time.     Imaging: ECHOCARDIOGRAM COMPLETE  Result Date: 10/10/2020    ECHOCARDIOGRAM REPORT   Patient Name:   Shirley Sullivan Date of Exam: 10/10/2020 Medical Rec #:  [id number]     Height:       64.0 in Accession #:    [id number]    Weight:       195.0 lb Date of Birth:  05/26/1961      BSA:          1.935 m Patient Age:    59 years      BP:           106/77 mmHg Patient Gender: F             HR:           105 bpm. Exam Location:  Inpatient Procedure: 2D Echo, Color Doppler, Cardiac Doppler and Intracardiac            Opacification Agent Indications:    Septic Shock  History:        Patient has no prior history of Echocardiogram examinations.                 Risk Factors:Diabetes and COVID+ 08/2020.  Sonographer:    Irving BurtonEmily Senior RDCS Referring Phys: [phone] PAULA B SIMPSON IMPRESSIONS  1. Left ventricular ejection fraction,  by estimation, is 55 to 60%. The left ventricle has normal function. The left ventricle has no regional wall motion abnormalities. Left ventricular diastolic parameters were normal.  2. Right ventricular systolic function is normal. The right ventricular size is normal. There is mildly elevated pulmonary artery systolic pressure.  3. The mitral valve is normal in structure. No evidence of mitral valve regurgitation. No evidence of mitral stenosis.  4. The aortic valve is normal in structure. Aortic valve regurgitation is not visualized. Mild to moderate aortic valve sclerosis/calcification is present, without any evidence of aortic stenosis.  5. The inferior vena cava is normal in size with greater than 50% respiratory variability, suggesting right atrial pressure of 3 mmHg. FINDINGS  Left Ventricle: Left ventricular ejection fraction, by estimation, is 55 to 60%. The left ventricle has normal function. The left ventricle has no regional wall  motion abnormalities. Definity contrast agent was given IV to delineate the left ventricular  endocardial borders. The left ventricular internal cavity size was normal in size. There is no left ventricular hypertrophy. Left ventricular diastolic parameters were normal. Right Ventricle: The right ventricular size is normal. No increase in right ventricular wall thickness. Right ventricular systolic function is normal. There is mildly elevated pulmonary artery systolic pressure. The tricuspid regurgitant velocity is 2.39  m/s, and with an assumed right atrial pressure of 15 mmHg, the estimated right ventricular systolic pressure is 37.8 mmHg. Left Atrium: Left atrial size was normal in size. Right Atrium: Right atrial size was normal in size. Pericardium: There is no evidence of pericardial effusion. Mitral Valve: The mitral valve is normal in structure. There is moderate thickening of the mitral valve leaflet(s). There is moderate calcification of the mitral valve leaflet(s). No evidence of mitral valve regurgitation. No evidence of mitral valve stenosis. Tricuspid Valve: The tricuspid valve is normal in structure. Tricuspid valve regurgitation is trivial. No evidence of tricuspid stenosis. Aortic Valve: The aortic valve is normal in structure. Aortic valve regurgitation is not visualized. Mild to moderate aortic valve sclerosis/calcification is present, without any evidence of aortic stenosis. Pulmonic Valve: The pulmonic valve was normal in structure. Pulmonic valve regurgitation is not visualized. No evidence of pulmonic stenosis. Aorta: The aortic root is normal in size and structure. Venous: The inferior vena cava is normal in size with greater than 50% respiratory variability, suggesting right atrial pressure of 3 mmHg. IAS/Shunts: No atrial level shunt detected by color flow Doppler.  LEFT VENTRICLE PLAX 2D LVIDd:         3.80 cm  Diastology LVIDs:         2.80 cm  LV e' medial:    5.22 cm/s LV PW:          1.10 cm  LV E/e' medial:  11.3 LV IVS:        1.10 cm  LV e' lateral:   6.31 cm/s LVOT diam:     2.00 cm  LV E/e' lateral: 9.4 LV SV:         41 LV SV Index:   21 LVOT Area:     3.14 cm  RIGHT VENTRICLE             IVC RV S prime:     10.00 cm/s  IVC diam: 2.75 cm TAPSE (M-mode): 2.3 cm LEFT ATRIUM           Index       RIGHT ATRIUM           Index LA diam:  3.40 cm 1.76 cm/m  RA Area:     17.90 cm LA Vol (A2C): 45.5 ml 23.51 ml/m RA Volume:   52.90 ml  27.33 ml/m LA Vol (A4C): 62.1 ml 32.09 ml/m  AORTIC VALVE LVOT Vmax:   73.10 cm/s LVOT Vmean:  59.100 cm/s LVOT VTI:    0.130 m  AORTA Ao Root diam: 3.10 cm Ao Asc diam:  3.10 cm MITRAL VALVE               TRICUSPID VALVE MV Area (PHT): 3.19 cm    TR Peak grad:   22.8 mmHg MV Decel Time: 238 msec    TR Vmax:        239.00 cm/s MV E velocity: 59.10 cm/s MV A velocity: 60.80 cm/s  SHUNTS MV E/A ratio:  0.97        Systemic VTI:  0.13 m                            Systemic Diam: 2.00 cm Charlton Haws MD Electronically signed by Charlton Haws MD Signature Date/Time: 10/10/2020/1:14:53 PM    Final    IR NEPHROSTOMY PLACEMENT RIGHT  Result Date: 10/10/2020 CLINICAL DATA:  Obstructing proximal right ureteral calculus and sepsis. The patient presents for right percutaneous nephrostomy tube placement for right renal decompression and treatment of presumed sepsis source. EXAM: 1. ULTRASOUND GUIDANCE FOR PUNCTURE OF THE RIGHT RENAL COLLECTING SYSTEM. 2. LEFT PERCUTANEOUS NEPHROSTOMY TUBE PLACEMENT. COMPARISON:  CTA on 10/09/2020 ANESTHESIA/SEDATION: 0.5 mg IV Versed; 75 mcg IV Fentanyl. Total Moderate Sedation Time 34 minutes. The patient's level of consciousness and physiologic status were continuously monitored during the procedure by Radiology nursing. CONTRAST:  8 ml Omnipaque 300 MEDICATIONS: 2 g IV Ancef. Antibiotic was administered in an appropriate time frame prior to skin puncture. FLUOROSCOPY TIME:  9 minutes and 48 seconds. 135 mGy. PROCEDURE: The  procedure, risks, benefits, and alternatives were explained to the patient. Questions regarding the procedure were encouraged and answered. The patient understands and consents to the procedure. A time-out was performed prior to initiating the procedure. Ultrasound was used to localize the right kidney. The right flank region was prepped with chlorhexidine in a sterile fashion, and a sterile drape was applied covering the operative field. A sterile gown and sterile gloves were used for the procedure. Local anesthesia was provided with 1% Lidocaine. Ultrasound was used to localize the right kidney. Under direct ultrasound guidance, a 21 gauge needle was advanced into the renal collecting system. Ultrasound image documentation was performed. Aspiration of urine sample was performed followed by contrast injection. A transitional dilator was advanced over a guidewire. Guidewire advancement was then performed via a 5 French catheter. Percutaneous tract dilatation was then performed over the guidewire. A 10-French percutaneous nephrostomy tube was then advanced and formed in the collecting system. Catheter position was confirmed by fluoroscopy after contrast injection. The catheter was secured at the skin with a Prolene retention suture and Stat-Lock device. A gravity bag was placed. COMPLICATIONS: None. FINDINGS: Initial ultrasound demonstrates mild right-sided hydronephrosis and attempt at opacification of the collecting system under ultrasound guidance with a 21 gauge needle was unsuccessful. Under fluoroscopy, there was residual faint opacification of the collecting system from excreted contrast administered at the time of CTA yesterday. After lower pole puncture under fluoroscopy, access was gained to the collecting system allowing placement of a 5 French catheter followed by the nephrostomy tube. The nephrostomy tube is formed in  the renal pelvis. There was bleeding in the collecting system at the time of tube  placement with clot present in the collecting system with contrast injection. IMPRESSION: Right-sided percutaneous nephrostomy tube placement with placement of 10 French catheter formed in the renal pelvis. There was some bleeding at the time of tube placement with clot formation in the collecting system. The tube will be flushed and irrigated. The nephrostomy tube was connected to a gravity drainage bag. Electronically Signed   By: Irish Lack M.D.   On: 10/10/2020 16:50    Labs:  CBC: Recent Labs    10/11/20 0144 10/11/20 0144 10/11/20 1415 10/12/20 0440 10/12/20 1319 10/13/20 0801  WBC 11.6*  --   --  SPECIMEN CLOTTED 9.0 10.4  HGB 12.3  --   --  SPECIMEN CLOTTED 11.1* 10.6*  HCT 38.0  --   --  SPECIMEN CLOTTED 33.7* 32.7*  PLT 32*   < > 23* SPECIMEN CLOTTED 23* 26*   < > = values in this interval not displayed.    COAGS: Recent Labs    10/09/20 1750 10/10/20 0133 10/11/20 1415  INR 1.5* 1.4* 1.3*  APTT 37*  --  32    BMP: Recent Labs    10/11/20 0144 10/12/20 0440 10/13/20 0801 10/14/20 0834  NA 139 138 140 138  K 5.1 4.1 3.7 3.6  CL 103 105 104 103  CO2 19* 23 26 25   GLUCOSE 156* 152* 124* 119*  BUN 57* 63* 50* 38*  CALCIUM 8.1* 8.5* 8.3* 8.1*  CREATININE 2.95* 1.94* 1.28* 1.04*  GFRNONAA 18* 29* 48* >60    LIVER FUNCTION TESTS: Recent Labs    10/09/20 1958 10/10/20 0429 10/11/20 0144 10/13/20 0801  BILITOT 0.8 1.0 1.1 1.1  AST 43* 49* 39 31  ALT 30 33 30 21  ALKPHOS 51 65 158* 130*  PROT 5.4* 6.1* 6.1* 5.9*  ALBUMIN 2.3* 2.6* 2.4* 1.9*    Assessment and Plan:  History of sepsis and right hydronephrosis secondary to obstructing proximal right ureteral calculus s/p right percutaneous nephrostomy tube placement in IR 10/10/2020. Right nephrostomy tube stable with good urine output. Serosanguinous but clearing, no clots Continue current drain management- continue with Qshift flushes/monitor of output. Further plans per CCM/urology- appreciate  and agree with management. IR to follow.   Electronically Signed: 13/07/2020, PA-C 10/14/2020, 11:03 AM   I spent a total of 25 Minutes at the the patient's bedside AND on the patient's hospital floor or unit, greater than 50% of which was counseling/coordinating care for right hydronephrosis s/p right nephrostomy tube placement.

## 2020-10-14 NOTE — Evaluation (Signed)
Physical Therapy Evaluation Patient Details Name: Shirley Sullivan MRN: [id number] DOB: [birth year] Today's Date: 10/14/2020   History of Present Illness  Patient is a 59 y/o female with PMH of DM, asthma, nephrolithiasis, lupus, depression/anxiety, arthritis, and COVID-19 in 08/2020. Patient presented to the ED with abdominal/flank pain, UTI symptoms, N/V, and intermittent chest pain after passing kidney stone on 11/4. Found to be in septic shock with UTI and obstructing R ureteral stone with mild hydronephrosis with AKI.  Clinical Impression  PTA, patient lives with husband and 2 adult children and reports independence with mobility. Patient required modA for supine to sit and minA for sit to supine. Patient performed sit to stand with minA and RW, assist for boost up and cues for hand placement. Stand pivot transfer bed>BSC with RW and minA for management of RW. Patient ambulated 5' with RW and min guard towards HOB. Patient with increased hesitancy due to fear of falling. Patient presents with generalized weakness, impaired balance, decreased activity tolerance, and impaired functional mobility. Patient will benefit from skilled PT services during acute stay to address listed deficits. Recommend HHPT at this time to maximize functional mobility, however patient may decline services due to no insurance.    Follow Up Recommendations Home health PT;Supervision for mobility/OOB (pt may decline due to no insurance)    Equipment Recommendations  Rolling Makayle Krahn with 5" wheels;3in1 (PT)    Recommendations for Other Services       Precautions / Restrictions Precautions Precautions: Fall Restrictions Weight Bearing Restrictions: No      Mobility  Bed Mobility Overal bed mobility: Needs Assistance Bed Mobility: Supine to Sit;Sit to Supine     Supine to sit: Mod assist;HOB elevated Sit to supine: Min assist   General bed mobility comments: modA for supine to sit for trunk elevation and  reposition hips on EOB. MinA for sit to supine for assist with B LEs    Transfers Overall transfer level: Needs assistance Equipment used: Rolling Tineka Uriegas (2 wheeled) Transfers: Sit to/from [employer] Sit to Stand: Min assist Stand pivot transfers: Min assist       General transfer comment: minA for stand pivot bed>BSC for RW management. Cues for hand placement during sit to stand transfer   Ambulation/Gait Ambulation/Gait assistance: Min guard Gait Distance (Feet): 5 Feet Assistive device: Rolling Geraldine Tesar (2 wheeled) Gait Pattern/deviations: Step-through pattern;Decreased stride length;Wide base of support Gait velocity: decreased   General Gait Details: ambulated 5' towards [facility] with RW and min guard for safety. Patient hesitant due to fear of falling, however does not require extensive assistance  Stairs            Wheelchair Mobility    Modified Rankin (Stroke Patients Only)       Balance Overall balance assessment: Needs assistance Sitting-balance support: No upper extremity supported;Feet supported Sitting balance-Leahy Scale: Good     Standing balance support: Bilateral upper extremity supported;During functional activity Standing balance-Leahy Scale: Poor Standing balance comment: reliant on UE support                             Pertinent Vitals/Pain Pain Assessment: No/denies pain    Home Living Family/patient expects to be discharged to:: Private residence Living Arrangements: Spouse/significant other;Children Available Help at Discharge: Family;Available 24 hours/day Type of Home: House Home Access: Stairs to enter Entrance Stairs-Rails: None Entrance Stairs-Number of Steps: 2 (small) Home Layout: One level Home Equipment: None Additional Comments: 2 falls  in the past 2 weeks, no injuries    Prior Function Level of Independence: Independent         Comments: progresively got weaker before she got to hospital      Hand Dominance        Extremity/Trunk Assessment   Upper Extremity Assessment Upper Extremity Assessment: Overall WFL for tasks assessed    Lower Extremity Assessment Lower Extremity Assessment: Generalized weakness       Communication   Communication: No difficulties  Cognition Arousal/Alertness: Awake/alert Behavior During Therapy: WFL for tasks assessed/performed Overall Cognitive Status: Within Functional Limits for tasks assessed                                        General Comments General comments (skin integrity, edema, etc.): Husband present during session    Exercises     Assessment/Plan    PT Assessment Patient needs continued PT services  PT Problem List Decreased strength;Decreased activity tolerance;Decreased balance;Decreased mobility       PT Treatment Interventions DME instruction;Gait training;Stair training;Functional mobility training;Therapeutic activities;Therapeutic exercise;Balance training;Patient/family education    PT Goals (Current goals can be found in the Care Plan section)  Acute Rehab PT Goals Patient Stated Goal: to go home  PT Goal Formulation: With patient/family Time For Goal Achievement: 10/28/20 Potential to Achieve Goals: Good    Frequency Min 3X/week   Barriers to discharge        Co-evaluation               AM-PAC PT "6 Clicks" Mobility  Outcome Measure Help needed turning from your back to your side while in a flat bed without using bedrails?: A Lot Help needed moving from lying on your back to sitting on the side of a flat bed without using bedrails?: A Lot Help needed moving to and from a bed to a chair (including a wheelchair)?: A Little Help needed standing up from a chair using your arms (e.g., wheelchair or bedside chair)?: A Little Help needed to walk in hospital room?: A Little Help needed climbing 3-5 steps with a railing? : A Little 6 Click Score: 16    End of Session  Equipment Utilized During Treatment: Gait belt Activity Tolerance: Patient tolerated treatment well Patient left: in bed;with call bell/phone within reach;with family/visitor present Nurse Communication: Mobility status PT Visit Diagnosis: Unsteadiness on feet (R26.81);Other abnormalities of gait and mobility (R26.89);Muscle weakness (generalized) (M62.81);History of falling (Z91.81)    Time: 3086-5784 PT Time Calculation (min) (ACUTE ONLY): 46 min   Charges:   PT Evaluation $PT Eval Moderate Complexity: 1 Mod PT Treatments $Therapeutic Activity: 23-37 mins        Gregor Hams, PT, DPT Acute Rehabilitation Services Pager [phone] Office [phone]   Zannie Kehr Allred 10/14/2020, 11:34 AM

## 2020-10-14 NOTE — TOC Progression Note (Signed)
Transition of Care [facility]) - Progression Note    Patient Details  Name: Shirley Sullivan MRN: [id number] Date of Birth: Nov 01, 1961  [facility] CM/SW Contact  Epifanio Lesches, RN Phone Number: 10/14/2020, 10:00 AM  Clinical Narrative:    Transferred to 5N 11/11 from ICU Admitted 11/7 with abd/flank pain/ UTI...obstructing right ureteral stone with mild hydronephrosis with AKI .        - s/p right percutaneous nephrostomy tube placement in IR 10/10/2020  Per MD not medically ready for d/c. Pt with resulted BCID: klebsiella oxytoca and enterobacter cloacae. Pt requiring IV ABX therapy.  PT evaluation pending .Marland Kitchen... TOC team will continue to follow and monitor for TOC needs.....     Expected Discharge Plan: Home/Self Care Barriers to Discharge: Continued Medical Work up  Expected Discharge Plan and Services Expected Discharge Plan: Home/Self Care   Discharge Planning Services: CM Consult                                           Social Determinants of Health (SDOH) Interventions    Readmission Risk Interventions No flowsheet data found.

## 2020-10-15 DIAGNOSIS — E669 Obesity, unspecified: Secondary | ICD-10-CM

## 2020-10-15 DIAGNOSIS — E1169 Type 2 diabetes mellitus with other specified complication: Secondary | ICD-10-CM

## 2020-10-15 DIAGNOSIS — A414 Sepsis due to anaerobes: Secondary | ICD-10-CM

## 2020-10-15 DIAGNOSIS — N179 Acute kidney failure, unspecified: Secondary | ICD-10-CM

## 2020-10-15 DIAGNOSIS — R7881 Bacteremia: Secondary | ICD-10-CM

## 2020-10-15 DIAGNOSIS — N390 Urinary tract infection, site not specified: Secondary | ICD-10-CM

## 2020-10-15 LAB — CBC
HCT: 33 % — ABNORMAL LOW (ref 36.0–46.0)
Hemoglobin: 10.6 g/dL — ABNORMAL LOW (ref 12.0–15.0)
MCH: 27.7 pg (ref 26.0–34.0)
MCHC: 32.1 g/dL (ref 30.0–36.0)
MCV: 86.4 fL (ref 80.0–100.0)
Platelets: DECREASED 10*3/uL (ref 150–400)
RBC: 3.82 MIL/uL — ABNORMAL LOW (ref 3.87–5.11)
RDW: 15.9 % — ABNORMAL HIGH (ref 11.5–15.5)
WBC: 10.3 10*3/uL (ref 4.0–10.5)
nRBC: 0 % (ref 0.0–0.2)

## 2020-10-15 LAB — BASIC METABOLIC PANEL
Anion gap: 9 (ref 5–15)
BUN: 23 mg/dL (ref 8–23)
CO2: 24 mmol/L (ref 22–32)
Calcium: 7.8 mg/dL — ABNORMAL LOW (ref 8.9–10.3)
Chloride: 106 mmol/L (ref 98–111)
Creatinine, Ser: 0.9 mg/dL (ref 0.44–1.00)
GFR, Estimated: >60 mL/min (ref >60)
Glucose, Bld: 135 mg/dL — ABNORMAL HIGH (ref 70–99)
Potassium: 3.4 mmol/L — ABNORMAL LOW (ref 3.5–5.1)
Sodium: 139 mmol/L (ref 135–145)

## 2020-10-15 LAB — GLUCOSE, CAPILLARY
Glucose-Capillary: 104 mg/dL — ABNORMAL HIGH (ref 70–99)
Glucose-Capillary: 113 mg/dL — ABNORMAL HIGH (ref 70–99)
Glucose-Capillary: 127 mg/dL — ABNORMAL HIGH (ref 70–99)
Glucose-Capillary: 127 mg/dL — ABNORMAL HIGH (ref 70–99)
Glucose-Capillary: 132 mg/dL — ABNORMAL HIGH (ref 70–99)
Glucose-Capillary: 140 mg/dL — ABNORMAL HIGH (ref 70–99)

## 2020-10-15 NOTE — Progress Notes (Signed)
Physical Therapy Treatment Patient Details Name: Shirley Sullivan MRN: [id number] DOB: 1961/10/30 Today's Date: 10/15/2020    History of Present Illness Patient is a 59 y/o female with PMH of DM, asthma, nephrolithiasis, lupus, depression/anxiety, arthritis, and COVID-19 in 08/2020. Patient presented to the ED with abdominal/flank pain, UTI symptoms, N/V, and intermittent chest pain after passing kidney stone on 11/4. Found to be in septic shock with UTI and obstructing R ureteral stone with mild hydronephrosis with AKI.    PT Comments    The pt is progressing well with mobility and PT goals this morning. She was able to complete initial bed mobility and transfers with minG only for safety, and completed 2 short bouts of ambulation in the room with use of RW and minG for safety. The pt and her husband were educated on safety with mobility at home, energy conservation, car transfers, and basic HEP for LE strength and ROM. Both verbalized understanding and had no further questions. The pt will be safe to d/c home with family support when medically cleared, but will continue to benefit from skilled PT to progress strength, endurance, stability, and facilitate return to prior level of independence and mobility.      Follow Up Recommendations  Home health PT;Supervision for mobility/OOB     Equipment Recommendations  Rolling walker with 5" wheels;3in1 (PT)    Recommendations for Other Services       Precautions / Restrictions Precautions Precautions: Fall Restrictions Weight Bearing Restrictions: No    Mobility  Bed Mobility Overal bed mobility: Needs Assistance Bed Mobility: Supine to Sit     Supine to sit: Min guard     General bed mobility comments: minG for safety, pt did not need physical assist  Transfers Overall transfer level: Needs assistance Equipment used: Rolling walker (2 wheeled) Transfers: Sit to/from Stand Sit to Stand: Min guard         General transfer  comment: minA initially, but able to progress to minG for safety and VC for hand placement through session  Ambulation/Gait Ambulation/Gait assistance: Min guard Gait Distance (Feet): 25 Feet Assistive device: Rolling walker (2 wheeled) Gait Pattern/deviations: Step-through pattern;Decreased stride length;Wide base of support Gait velocity: decreased Gait velocity interpretation: <1.31 ft/sec, indicative of household ambulator General Gait Details: completed 2 bouts of 25 ft within room, pt with improved stability with continued gait. Husband present and supportive. Able to steady independently when minor LOB with gait       Balance Overall balance assessment: Needs assistance Sitting-balance support: No upper extremity supported;Feet supported Sitting balance-Leahy Scale: Good     Standing balance support: Bilateral upper extremity supported;During functional activity Standing balance-Leahy Scale: Poor Standing balance comment: reliant on UE support                            Cognition Arousal/Alertness: Awake/alert Behavior During Therapy: WFL for tasks assessed/performed Overall Cognitive Status: Within Functional Limits for tasks assessed                                        Exercises General Exercises - Lower Extremity Long Arc Quad: AROM;Both;10 reps;Seated Heel Raises: AROM;Both;10 reps;Seated    General Comments General comments (skin integrity, edema, etc.): husband present and supportive      Pertinent Vitals/Pain Pain Assessment: No/denies pain  PT Goals (current goals can now be found in the care plan section) Acute Rehab PT Goals Patient Stated Goal: to go home  PT Goal Formulation: With patient/family Time For Goal Achievement: 10/28/20 Potential to Achieve Goals: Good Progress towards PT goals: Progressing toward goals    Frequency    Min 3X/week      PT Plan Current plan remains appropriate        AM-PAC PT "6 Clicks" Mobility   Outcome Measure  Help needed turning from your back to your side while in a flat bed without using bedrails?: None Help needed moving from lying on your back to sitting on the side of a flat bed without using bedrails?: A Little Help needed moving to and from a bed to a chair (including a wheelchair)?: A Little Help needed standing up from a chair using your arms (e.g., wheelchair or bedside chair)?: A Little Help needed to walk in hospital room?: A Little Help needed climbing 3-5 steps with a railing? : A Little 6 Click Score: 19    End of Session Equipment Utilized During Treatment: Gait belt Activity Tolerance: Patient tolerated treatment well Patient left: in chair;with chair alarm set;with family/visitor present Nurse Communication: Mobility status PT Visit Diagnosis: Unsteadiness on feet (R26.81);Other abnormalities of gait and mobility (R26.89);Muscle weakness (generalized) (M62.81);History of falling (Z91.81)     Time: 2595-6387 PT Time Calculation (min) (ACUTE ONLY): 23 min  Charges:  $Gait Training: 23-37 mins                     Rolm Baptise, PT, DPT   Acute Rehabilitation Department Pager #: [phone]   Gaetana Michaelis 10/15/2020, 1:33 PM

## 2020-10-15 NOTE — Plan of Care (Signed)
  Problem: Activity: Goal: Risk for activity intolerance will decrease Outcome: Progressing   Problem: Coping: Goal: Level of anxiety will decrease Outcome: Progressing   Problem: Elimination: Goal: Will not experience complications related to bowel motility Outcome: Progressing   Problem: Pain Managment: Goal: General experience of comfort will improve Outcome: Progressing   Problem: Safety: Goal: Ability to remain free from injury will improve Outcome: Progressing   

## 2020-10-15 NOTE — Plan of Care (Signed)

## 2020-10-15 NOTE — Progress Notes (Signed)
PROGRESS NOTE    Shirley Sullivan  ZOX:[id number] DOB: 10/15/1961 DOA: 10/09/2020 PCP: Patient, No Pcp Per   Brief Narrative:  HPI on 10/10/2020 by [name], PA 59 year old female with DM, asthma, nephrolithiasis, lupus (no flare in last 5 years/ not on home, depression/ anxiety, arthritis, and COVID-19 (positive on 08/15/2020) presenting to ER with complaints of not feeling well since Thursday.  She thinks she passed a kidney stone on the right and since has continued to fell bad with suprapubic abdominal pain, bilateral flank pain, back pain, nausea and vomiting, decreased urinary output, dysuria, some confusion, generalized weakness, diarrhea, complaints of feeling hot, intermittent midsternum chest pain, and mild shortness of breath.   Interim history Patient admitted with Enterobacter/Klebsiella bacteremia secondary to obstructing renal stone along with sepsis and shock. Patient did require pressors which have now been discontinued as this did improve. Patient also with acute kidney injury and metabolic acidosis. Urology consulted recommended IR consult for nephrostomy tube placement which occurred on 11/8. Patient on full liquids at this time and tolerating well, will advance to solid foods and see if she is able to tolerate this. Assessment & Plan   Septic shock secondary to Klebsiella oxytocin and Enterobacter cloacae bacteremia/UTI -Sepsis was present on admission -Shock has now resolved (patient did require pressors on admission) -Blood and urine cultures from 11/7: Positive for Klebsiella and Enterobacter -Repeat blood cultures on 11/10 showed no growth to date -Currently on ciprofloxacin 500 mg twice daily. Will need 10 to 14 days of antibiotics -Interventional radiology consulted, status post nephrostomy drain placement and recommending flushes with every shift. Husband tells me that he has been trained on how to do this -Patient will need to follow-up with drain clinic as well  as urology, Dr. Laverle PatterBorden  Acute kidney injury -Creatinine peaked to 3.49 -Suspect secondary to obstruction as well as sepsis -Creatinine down to 0.9  Elevated troponin/NSTEMI -High-sensitivity troponin peaked at 898 -Suspect secondary to septic shock -Echocardiogram 10/10/2020 showed an EF of 55 to 60%, LV diastolic parameters normal. Mildly elevated pulmonary artery systolic pressure  Nausea and vomiting -Suspect secondary to infection as well as shock -Denies current nausea or vomiting -Has had poor oral intake, will continue IV fluids and advance diet to soft. If she is able to tolerate, will discontinue IV fluids -Continue antiemetics as needed  Acute metabolic encephalopathy  -Resolved, patient currently alert and oriented x3 -Suspect secondary to septic shock  Severe thrombocytopenia -Likely secondary to septic shock -DIC panel was unremarkable -Platelets improved to 59 -Continue to monitor CBC  Normocytic anemia -Hemoglobin currently 10. Baseline approximately 12 -Suspect drop likely dilutional -Continue to monitor CBC  Diabetes mellitus, type II with neuropathy -Controlled -Hemoglobin A1c 5.8 -Takes Metformin at home however this has been -Continue insulin sliding scale with CBG monitoring -Continue gabapentin  SLE -Currently not on any treatment  Depression/anxiety -Continue Xanax and Zoloft  Hepatosplenomegaly -Noted on CT imaging -? [facility] as [facility] denies [facility] use -Patient will need to follow-up with gastroenterology as an outpatient  Obesity -BMI 34.5 -Status post gastric banding -Patient to follow up with her primary care physician to discuss lifestyle modifications  DVT Prophylaxis SCDs  Code Status: Full  Family Communication: Husband at bedside  Disposition Plan:  Status is: Inpatient  Remains inpatient appropriate because:Will need to advance diet and make sure patient is able to tolerate this   Dispo: The patient is  from: Home  Anticipated d/c is to: Home              Anticipated d/c date is: 1 day              Patient currently is not medically stable to d/c.   Consultants PCCM Urology Interventional radiology  Procedures  Echocardiogram IR guided nephrostomy tube placement  Antibiotics   Anti-infectives (From admission, onward)   Start     Dose/Rate Route Frequency Ordered Stop   10/13/20 1130  ciprofloxacin (CIPRO) tablet 500 mg        500 mg Oral 2 times daily 10/13/20 1032     10/10/20 1745  cefTRIAXone (ROCEPHIN) 2 g in sodium chloride 0.9 % 100 mL IVPB  Status:  Discontinued        2 g 200 mL/hr over 30 Minutes Intravenous Every 24 hours 10/09/20 2207 10/10/20 0820   10/10/20 1422  ceFAZolin (ANCEF) 2-4 GM/100ML-% IVPB       Note to Pharmacy: Theressa Stamps   : cabinet override      10/10/20 1422 10/10/20 1500   10/10/20 0820  ceFEPIme (MAXIPIME) 2 g in sodium chloride 0.9 % 100 mL IVPB  Status:  Discontinued        2 g 200 mL/hr over 30 Minutes Intravenous Every 24 hours 10/10/20 0820 10/13/20 1032   10/09/20 2215  cefTRIAXone (ROCEPHIN) 1 g in sodium chloride 0.9 % 100 mL IVPB        1 g 200 mL/hr over 30 Minutes Intravenous STAT 10/09/20 2207 10/09/20 2319   10/09/20 1745  cefTRIAXone (ROCEPHIN) 1 g in sodium chloride 0.9 % 100 mL IVPB  Status:  Discontinued        1 g 200 mL/hr over 30 Minutes Intravenous Every 24 hours 10/09/20 1744 10/09/20 2207      Subjective:   Salvadore Farber seen and examined today. Patient with mild pain at the tube site today. States she has not been very hungry. Is willing to try eating solid food today. Denies current chest pain or shortness of breath, abdominal pain, nausea or vomiting, dizziness or headache.  Objective:   Vitals:   10/14/20 1500 10/14/20 1924 10/15/20 0401 10/15/20 0754  BP: 117/80 114/75 131/72 132/76  Pulse: 74 81 89 84  Resp:  17 16 16   Temp: 98.7 F (37.1 C) 99.7 F (37.6 C) 98.5 F (36.9 C) 98.7 F (37.1 C)   TempSrc: Oral Oral Oral Oral  SpO2: 98% 97% 94% 95%  Weight:      Height:        Intake/Output Summary (Last 24 hours) at 10/15/2020 10/17/2020 Last data filed at 10/15/2020 0400 Gross per 24 hour  Intake 1440.67 ml  Output 475 ml  Net 965.67 ml   Filed Weights   10/11/20 0253 10/12/20 0500 10/13/20 0500  Weight: 88.5 kg 89.7 kg 91.3 kg    Exam  General: Well developed, well nourished, NAD, appears stated age  HEENT: NCAT,, mucous membranes moist.   Cardiovascular: S1 S2 auscultated, RRR  Respiratory: Clear to auscultation bilaterally with equal chest rise  Abdomen: Soft, obese, nontender, nondistended, + bowel sounds  Extremities: warm dry without cyanosis clubbing or edema  Neuro: AAOx3, nonfocal  Psych: appropriate mood and affect, pleasant   Data Reviewed: I have personally reviewed following labs and imaging studies  CBC: Recent Labs  Lab 10/09/20 1750 10/10/20 0133 10/11/20 0144 10/11/20 0144 10/11/20 1415 10/12/20 0440 10/12/20 1319 10/13/20 0801 10/14/20 1145  WBC 6.6   < >  11.6*  --   --  SPECIMEN CLOTTED 9.0 10.4 11.7*  NEUTROABS 5.3  --   --   --   --   --  7.5  --   --   HGB 12.5   < > 12.3  --   --  SPECIMEN CLOTTED 11.1* 10.6* 10.1*  HCT 39.1   < > 38.0  --   --  SPECIMEN CLOTTED 33.7* 32.7* 30.8*  MCV 87.7   < > 87.4  --   --  SPECIMEN CLOTTED 84.7 84.3 85.8  PLT 42*   < > 32*   < > 23* SPECIMEN CLOTTED 23* 26* 59*   < > = values in this interval not displayed.   Basic Metabolic Panel: Recent Labs  Lab 10/10/20 1553 10/11/20 0144 10/11/20 1415 10/12/20 0440 10/13/20 0801 10/14/20 0834  NA 137 139  --  138 140 138  K 4.7 5.1  --  4.1 3.7 3.6  CL 103 103  --  105 104 103  CO2 18* 19*  --  GLUCOSE 146* 156*  --  152* 124* 119*  BUN 49* 57*  --  63* 50* 38*  CREATININE 2.97* 2.95*  --  1.94* 1.28* 1.04*  CALCIUM 8.2* 8.1*  --  8.5* 8.3* 8.1*  MG  --   --  1.6* 2.4  --   --    GFR: Estimated Creatinine Clearance: 62.1  mL/min (A) (by C-G formula based on SCr of 1.04 mg/dL (H)). Liver Function Tests: Recent Labs  Lab 10/09/20 1958 10/10/20 0429 10/11/20 0144 10/13/20 0801  AST 43* 49* 39 31  ALT 30 33 30 21  ALKPHOS 51 65 158* 130*  BILITOT 0.8 1.0 1.1 1.1  PROT 5.4* 6.1* 6.1* 5.9*  ALBUMIN 2.3* 2.6* 2.4* 1.9*   No results for input(s): LIPASE, AMYLASE in the last 168 hours. Recent Labs  Lab 10/11/20 0749  AMMONIA 32   Coagulation Profile: Recent Labs  Lab 10/09/20 1750 10/10/20 0133 10/11/20 1415  INR 1.5* 1.4* 1.3*   Cardiac Enzymes: No results for input(s): CKTOTAL, CKMB, CKMBINDEX, TROPONINI in the last 168 hours. BNP (last 3 results) No results for input(s): PROBNP in the last 8760 hours. HbA1C: No results for input(s): HGBA1C in the last 72 hours. CBG: Recent Labs  Lab 10/14/20 1213 10/14/20 1651 10/15/20 0008 10/15/20 0357 10/15/20 0807  GLUCAP 107* 131* 127* 113* 127*   Lipid Profile: No results for input(s): CHOL, HDL, LDLCALC, TRIG, CHOLHDL, LDLDIRECT in the last 72 hours. Thyroid Function Tests: No results for input(s): TSH, T4TOTAL, FREET4, T3FREE, THYROIDAB in the last 72 hours. Anemia Panel: No results for input(s): VITAMINB12, FOLATE, FERRITIN, TIBC, IRON, RETICCTPCT in the last 72 hours. Urine analysis:    Component Value Date/Time   COLORURINE AMBER (A) 10/09/2020 2050   APPEARANCEUR CLOUDY (A) 10/09/2020 2050   LABSPEC 1.031 (H) 10/09/2020 2050   PHURINE 5.0 10/09/2020 2050   GLUCOSEU NEGATIVE 10/09/2020 2050   HGBUR LARGE (A) 10/09/2020 2050   BILIRUBINUR NEGATIVE 10/09/2020 2050   KETONESUR NEGATIVE 10/09/2020 2050   PROTEINUR 100 (A) 10/09/2020 2050   UROBILINOGEN 1.0 01/24/2015 0839   NITRITE POSITIVE (A) 10/09/2020 2050   LEUKOCYTESUR TRACE (A) 10/09/2020 2050   Sepsis Labs: (procalcitonin:4,lacticidven:4)  ) Recent Results (from the past 240 hour(s))  Blood Culture (routine x 2)     Status: Abnormal   Collection Time: 10/09/20   5:48 PM   Specimen: BLOOD LEFT HAND  Result Value  Ref Range Status   Specimen Description BLOOD LEFT HAND  Final   Special Requests   Final    BOTTLES DRAWN AEROBIC AND ANAEROBIC Blood Culture results may not be optimal due to an inadequate volume of blood received in culture bottles   Culture  Setup Time   Final    GRAM NEGATIVE RODS IN BOTH AEROBIC AND ANAEROBIC BOTTLES CRITICAL VALUE NOTED.  VALUE IS CONSISTENT WITH PREVIOUSLY REPORTED AND CALLED VALUE. Performed at [facility], [address] [place], [place] [address]    Culture ENTEROBACTER CLOACAE KLEBSIELLA OXYTOCA  (A)  Final   Report Status 10/13/2020 FINAL  Final   Organism ID, Bacteria ENTEROBACTER CLOACAE  Final   Organism ID, Bacteria KLEBSIELLA OXYTOCA  Final      Susceptibility   Enterobacter cloacae - MIC*    CEFAZOLIN >=64 RESISTANT Resistant     CEFEPIME <=0.12 SENSITIVE Sensitive     CEFTAZIDIME <=1 SENSITIVE Sensitive     CIPROFLOXACIN <=0.25 SENSITIVE Sensitive     GENTAMICIN <=1 SENSITIVE Sensitive     IMIPENEM <=0.25 SENSITIVE Sensitive     TRIMETH/SULFA <=20 SENSITIVE Sensitive     PIP/TAZO <=4 SENSITIVE Sensitive     * ENTEROBACTER CLOACAE   Klebsiella oxytoca - MIC*    AMPICILLIN RESISTANT Resistant     CEFAZOLIN <=4 SENSITIVE Sensitive     CEFEPIME <=0.12 SENSITIVE Sensitive     CEFTAZIDIME <=1 SENSITIVE Sensitive     CEFTRIAXONE <=0.25 SENSITIVE Sensitive     CIPROFLOXACIN <=0.25 SENSITIVE Sensitive     GENTAMICIN <=1 SENSITIVE Sensitive     IMIPENEM <=0.25 SENSITIVE Sensitive     TRIMETH/SULFA <=20 SENSITIVE Sensitive     AMPICILLIN/SULBACTAM 4 SENSITIVE Sensitive     PIP/TAZO <=4 SENSITIVE Sensitive     * KLEBSIELLA OXYTOCA  Blood Culture (routine x 2)     Status: Abnormal   Collection Time: 10/09/20  5:50 PM   Specimen: BLOOD RIGHT FOREARM  Result Value Ref Range Status   Specimen Description BLOOD RIGHT FOREARM  Final   Special Requests   Final    BOTTLES DRAWN AEROBIC AND  ANAEROBIC Blood Culture results may not be optimal due to an inadequate volume of blood received in culture bottles   Culture  Setup Time   Final    GRAM NEGATIVE RODS IN BOTH AEROBIC AND ANAEROBIC BOTTLES Organism ID to follow CRITICAL RESULT CALLED TO, READ BACK BY AND VERIFIED WITH: Gay Filler PharmD 9:30 10/10/20 ([email or web address])    Culture (A)  Final    KLEBSIELLA OXYTOCA ENTEROBACTER CLOACAE SUSCEPTIBILITIES PERFORMED ON PREVIOUS CULTURE WITHIN THE LAST 5 DAYS. Performed at [facility], [address], [place], [place] [address]    Report Status 10/13/2020 FINAL  Final  Blood Culture ID Panel (Reflexed)     Status: Abnormal   Collection Time: 10/09/20  5:50 PM  Result Value Ref Range Status   Enterococcus faecalis NOT DETECTED NOT DETECTED Final   Enterococcus Faecium NOT DETECTED NOT DETECTED Final   Listeria monocytogenes NOT DETECTED NOT DETECTED Final   Staphylococcus species NOT DETECTED NOT DETECTED Final   Staphylococcus aureus (BCID) NOT DETECTED NOT DETECTED Final   Staphylococcus epidermidis NOT DETECTED NOT DETECTED Final   Staphylococcus lugdunensis NOT DETECTED NOT DETECTED Final   Streptococcus species NOT DETECTED NOT DETECTED Final   Streptococcus agalactiae NOT DETECTED NOT DETECTED Final   Streptococcus pneumoniae NOT DETECTED NOT DETECTED Final   Streptococcus pyogenes NOT DETECTED NOT DETECTED Final  A.calcoaceticus-baumannii NOT DETECTED NOT DETECTED Final   Bacteroides fragilis NOT DETECTED NOT DETECTED Final   Enterobacterales DETECTED (A) NOT DETECTED Final    Comment: CRITICAL RESULT CALLED TO, READ BACK BY AND VERIFIED WITH: Gay Filler PharmD 9:30 10/10/20 ([email or web address])    Enterobacter cloacae complex DETECTED (A) NOT DETECTED Final    Comment: CRITICAL RESULT CALLED TO, READ BACK BY AND VERIFIED WITH: Gay Filler PharmD 9:30 10/10/20 ([email or web address])    Escherichia coli NOT DETECTED NOT DETECTED Final   Klebsiella aerogenes NOT DETECTED NOT DETECTED Final    Klebsiella oxytoca DETECTED (A) NOT DETECTED Final    Comment: CRITICAL RESULT CALLED TO, READ BACK BY AND VERIFIED WITH: Gay Filler PharmD 9:30 10/10/20 ([email or web address])    Klebsiella pneumoniae NOT DETECTED NOT DETECTED Final   Proteus species NOT DETECTED NOT DETECTED Final   Salmonella species NOT DETECTED NOT DETECTED Final   Serratia marcescens NOT DETECTED NOT DETECTED Final   Haemophilus influenzae NOT DETECTED NOT DETECTED Final   Neisseria meningitidis NOT DETECTED NOT DETECTED Final   Pseudomonas aeruginosa NOT DETECTED NOT DETECTED Final   Stenotrophomonas maltophilia NOT DETECTED NOT DETECTED Final   Candida albicans NOT DETECTED NOT DETECTED Final   Candida auris NOT DETECTED NOT DETECTED Final   Candida glabrata NOT DETECTED NOT DETECTED Final   Candida krusei NOT DETECTED NOT DETECTED Final   Candida parapsilosis NOT DETECTED NOT DETECTED Final   Candida tropicalis NOT DETECTED NOT DETECTED Final   Cryptococcus neoformans/gattii NOT DETECTED NOT DETECTED Final   CTX-M ESBL NOT DETECTED NOT DETECTED Final   Carbapenem resistance IMP NOT DETECTED NOT DETECTED Final   Carbapenem resistance KPC NOT DETECTED NOT DETECTED Final   Carbapenem resistance NDM NOT DETECTED NOT DETECTED Final   Carbapenem resist OXA 48 LIKE NOT DETECTED NOT DETECTED Final   Carbapenem resistance VIM NOT DETECTED NOT DETECTED Final    Comment: Performed at [facility], [address], [place], [place] [address]  Urine culture     Status: Abnormal   Collection Time: 10/09/20  8:50 PM   Specimen: In/Out Cath Urine  Result Value Ref Range Status   Specimen Description IN/OUT CATH URINE  Final   Special Requests   Final    NONE Performed at [facility], [address] [place], [place] [address]    Culture (A)  Final    >=100,000 COLONIES/mL ENTEROBACTER CLOACAE >=100,000 COLONIES/mL KLEBSIELLA OXYTOCA    Report Status 10/12/2020 FINAL  Final   Organism ID, Bacteria ENTEROBACTER CLOACAE (A)   Final   Organism ID, Bacteria KLEBSIELLA OXYTOCA (A)  Final      Susceptibility   Enterobacter cloacae - MIC*    CEFAZOLIN >=64 RESISTANT Resistant     CEFEPIME <=0.12 SENSITIVE Sensitive     CIPROFLOXACIN <=0.25 SENSITIVE Sensitive     GENTAMICIN <=1 SENSITIVE Sensitive     IMIPENEM <=0.25 SENSITIVE Sensitive     NITROFURANTOIN 64 INTERMEDIATE Intermediate     TRIMETH/SULFA <=20 SENSITIVE Sensitive     PIP/TAZO <=4 SENSITIVE Sensitive     * >=100,000 COLONIES/mL ENTEROBACTER CLOACAE   Klebsiella oxytoca - MIC*    AMPICILLIN RESISTANT Resistant     CEFAZOLIN <=4 SENSITIVE Sensitive     CEFEPIME <=0.12 SENSITIVE Sensitive     CEFTRIAXONE <=0.25 SENSITIVE Sensitive     CIPROFLOXACIN <=0.25 SENSITIVE Sensitive     GENTAMICIN <=1 SENSITIVE Sensitive     IMIPENEM <=0.25 SENSITIVE Sensitive     NITROFURANTOIN <=16 SENSITIVE Sensitive  TRIMETH/SULFA <=20 SENSITIVE Sensitive     AMPICILLIN/SULBACTAM 4 SENSITIVE Sensitive     PIP/TAZO <=4 SENSITIVE Sensitive     * >=100,000 COLONIES/mL KLEBSIELLA OXYTOCA  Respiratory Panel by RT PCR (Flu A&B, Covid) - Nasopharyngeal Swab     Status: Abnormal   Collection Time: 10/09/20  9:01 PM   Specimen: Nasopharyngeal Swab  Result Value Ref Range Status   SARS Coronavirus 2 by RT PCR POSITIVE (A) NEGATIVE Final    Comment: RESULT CALLED TO, READ BACK BY AND VERIFIED WITH: A. DAVISON,RN 2228 10/09/2020 T. TYSOR (NOTE) SARS-CoV-2 target nucleic acids are DETECTED.  SARS-CoV-2 RNA is generally detectable in upper respiratory specimens  during the acute phase of infection. Positive results are indicative of the presence of the identified virus, but do not rule out bacterial infection or co-infection with other pathogens not detected by the test. Clinical correlation with patient history and other diagnostic information is necessary to determine patient infection status. The expected result is Negative.  Fact Sheet for Patients:    https://www.moore.com/  Fact Sheet for Healthcare Providers: https://www.young.biz/  This test is not yet approved or cleared by the [place] FDA and  has been authorized for detection and/or diagnosis of SARS-CoV-2 by FDA under an Emergency Use Authorization (EUA).  This EUA will remain in effect (meaning this test can  be used) for the duration of  the COVID-19 declaration under Section 564(b)(1) of the Act, 21 U.S.C. section 360bbb-3(b)(1), unless the authorization is terminated or revoked sooner.      Influenza A by PCR NEGATIVE NEGATIVE Final   Influenza B by PCR NEGATIVE NEGATIVE Final    Comment: (NOTE) The Xpert Xpress SARS-CoV-2/FLU/RSV assay is intended as an aid in  the diagnosis of influenza from Nasopharyngeal swab specimens and  should not be used as a sole basis for treatment. Nasal washings and  aspirates are unacceptable for Xpert Xpress SARS-CoV-2/FLU/RSV  testing.  Fact Sheet for Patients: https://www.moore.com/  Fact Sheet for Healthcare Providers: https://www.young.biz/  This test is not yet approved or cleared by the [place] FDA and  has been authorized for detection and/or diagnosis of SARS-CoV-2 by  FDA under an Emergency Use Authorization (EUA). This EUA will remain  in effect (meaning this test can be used) for the duration of the  Covid-19 declaration under Section 564(b)(1) of the Act, 21  U.S.C. section 360bbb-3(b)(1), unless the authorization is  terminated or revoked. Performed at [facility], [address], [place], [place] [address]   MRSA PCR Screening     Status: None   Collection Time: 10/10/20 12:30 AM   Specimen: Nasal Mucosa; Nasopharyngeal  Result Value Ref Range Status   MRSA by PCR NEGATIVE NEGATIVE Final    Comment:        The GeneXpert MRSA Assay (FDA approved for NASAL specimens only), is one component of a comprehensive MRSA  colonization surveillance program. It is not intended to diagnose MRSA infection nor to guide or monitor treatment for MRSA infections. Performed at [facility], [address], [place], [place] [address]   C Difficile Quick Screen w PCR reflex     Status: None   Collection Time: 10/10/20  8:08 AM   Specimen: STOOL  Result Value Ref Range Status   C Diff antigen NEGATIVE NEGATIVE Final   C Diff toxin NEGATIVE NEGATIVE Final   C Diff interpretation No C. difficile detected.  Final    Comment: Performed at [facility],  [address] [place], [place] [address]  Culture, blood (routine x 2)     Status: None (Preliminary result)   Collection Time: 10/12/20  8:02 AM   Specimen: BLOOD  Result Value Ref Range Status   Specimen Description BLOOD RIGHT ANTECUBITAL  Final   Special Requests   Final    BOTTLES DRAWN AEROBIC AND ANAEROBIC Blood Culture adequate volume   Culture   Final    NO GROWTH 2 DAYS Performed at [facility], [address], [place], [place] [address]    Report Status PENDING  Incomplete  Culture, blood (routine x 2)     Status: None (Preliminary result)   Collection Time: 10/12/20  8:05 AM   Specimen: BLOOD  Result Value Ref Range Status   Specimen Description BLOOD RIGHT ANTECUBITAL  Final   Special Requests   Final    BOTTLES DRAWN AEROBIC AND ANAEROBIC Blood Culture adequate volume   Culture   Final    NO GROWTH 2 DAYS Performed at [facility], [address], [place], [place] [address]    Report Status PENDING  Incomplete      Radiology Studies: No results found.   Scheduled Meds: . Chlorhexidine Gluconate Cloth  6 each Topical Q0600  . ciprofloxacin  500 mg Oral BID  . gabapentin  300 mg Oral BID  . insulin aspart  0-9 Units Subcutaneous TID WC  . mouth rinse  15 mL Mouth Rinse BID  . sertraline  150 mg Oral Daily  . sodium chloride flush  5 mL Intracatheter Q8H   Continuous Infusions: . sodium chloride 75 mL/hr at  10/15/20 0400     LOS: 6 days   Time Spent in minutes   45 minutes  Reham Slabaugh D.O. on 10/15/2020 at 9:21 AM  Between 7am to 7pm - Please see pager noted on [email or web address]  After 7pm go to [email or web address]  And look for the night coverage person covering for me after hours  [facility] Office  [phone]

## 2020-10-15 NOTE — Progress Notes (Signed)
PT is recommending HHPT, supervision for mobility and OOB, and DME (RW and 3-in-1 BSC). Met with pt and husband. She reports that her 2 sons live with them and she has good family support. Informed them that I won't be able to arrange HHPT because her dx won't qualify her for charity care. Discussed outpt rehab but she declined. Her husband reports that he can assist with the therapy at home. She reports that she has DME (RW, 3-in-1 BSC, and transport chair) from her mother.

## 2020-10-16 DIAGNOSIS — N1330 Unspecified hydronephrosis: Secondary | ICD-10-CM

## 2020-10-16 LAB — BASIC METABOLIC PANEL
Anion gap: 10 (ref 5–15)
BUN: 15 mg/dL (ref 8–23)
CO2: 23 mmol/L (ref 22–32)
Calcium: 7.4 mg/dL — ABNORMAL LOW (ref 8.9–10.3)
Chloride: 107 mmol/L (ref 98–111)
Creatinine, Ser: 0.81 mg/dL (ref 0.44–1.00)
GFR, Estimated: >60 mL/min (ref >60)
Glucose, Bld: 124 mg/dL — ABNORMAL HIGH (ref 70–99)
Potassium: 3.3 mmol/L — ABNORMAL LOW (ref 3.5–5.1)
Sodium: 140 mmol/L (ref 135–145)

## 2020-10-16 LAB — CBC
HCT: 32.3 % — ABNORMAL LOW (ref 36.0–46.0)
Hemoglobin: 10.3 g/dL — ABNORMAL LOW (ref 12.0–15.0)
MCH: 27.7 pg (ref 26.0–34.0)
MCHC: 31.9 g/dL (ref 30.0–36.0)
MCV: 86.8 fL (ref 80.0–100.0)
Platelets: 113 10*3/uL — ABNORMAL LOW (ref 150–400)
RBC: 3.72 MIL/uL — ABNORMAL LOW (ref 3.87–5.11)
RDW: 15.7 % — ABNORMAL HIGH (ref 11.5–15.5)
WBC: 8.7 10*3/uL (ref 4.0–10.5)
nRBC: 0 % (ref 0.0–0.2)

## 2020-10-16 LAB — GLUCOSE, CAPILLARY
Glucose-Capillary: 114 mg/dL — ABNORMAL HIGH (ref 70–99)
Glucose-Capillary: 117 mg/dL — ABNORMAL HIGH (ref 70–99)
Glucose-Capillary: 121 mg/dL — ABNORMAL HIGH (ref 70–99)
Glucose-Capillary: 131 mg/dL — ABNORMAL HIGH (ref 70–99)

## 2020-10-16 LAB — MAGNESIUM: Magnesium: 1.3 mg/dL — ABNORMAL LOW (ref 1.7–2.4)

## 2020-10-16 MED ORDER — POTASSIUM CHLORIDE CRYS ER 20 MEQ PO TBCR
40.0000 meq | EXTENDED_RELEASE_TABLET | ORAL | Status: AC
  Administered 2020-10-16 (×2): 40 meq via ORAL
  Filled 2020-10-16 (×2): qty 2

## 2020-10-16 MED ORDER — ALPRAZOLAM 0.5 MG PO TABS: 0.2500 mg | ORAL_TABLET | Freq: Two times a day (BID) | ORAL | 0 refills | Status: DC | PRN

## 2020-10-16 MED ORDER — MAGNESIUM SULFATE 4 GM/100ML IV SOLN
4.0000 g | Freq: Once | INTRAVENOUS | Status: AC
  Administered 2020-10-16: 4 g via INTRAVENOUS
  Filled 2020-10-16: qty 100

## 2020-10-16 MED ORDER — TRAMADOL HCL 50 MG PO TABS: 50.0000 mg | ORAL_TABLET | Freq: Four times a day (QID) | ORAL | 0 refills | Status: DC | PRN

## 2020-10-16 MED ORDER — CIPROFLOXACIN HCL 500 MG PO TABS: 500.0000 mg | ORAL_TABLET | Freq: Two times a day (BID) | ORAL | 0 refills | Status: AC

## 2020-10-16 MED ORDER — ONDANSETRON 4 MG PO TBDP: ORAL_TABLET | ORAL | 0 refills | Status: DC

## 2020-10-16 NOTE — Plan of Care (Signed)

## 2020-10-16 NOTE — Progress Notes (Signed)
Pt IV removed, belongings gathered, AVS reviewed, all questions answered. Pt dressed and states they have needed DME at home, RN reinforced education on drain and gave needed supplies. Pt wheeled down by NT

## 2020-10-16 NOTE — Discharge Summary (Signed)
Physician Discharge Summary  EBONEY CLAYBROOK ZOX:[id number] DOB: 07-06-1961 DOA: 10/09/2020  PCP: Patient, No Pcp Per  Admit date: 10/09/2020 Discharge date: 10/16/2020  Time spent: 45 minutes  Recommendations for Outpatient Follow-up:  Patient will be discharged to home.  Patient will need to follow up with primary care provider within one week of discharge, repeat CBC, BMP, and magnesium.  Follow-up with the drain clinic in 2 weeks.  Continue daily flushes.  Follow-up with Dr. Laverle Patter, urology in 2 weeks.  Patient should continue medications as prescribed.  Patient should follow a soft diet.   Discharge Diagnoses:  Septic shock secondary to Klebsiella oxytocin and Enterobacter cloacae bacteremia/UTI Acute kidney injury Elevated troponin/NSTEMI Nausea and vomiting Acute metabolic encephalopathy  Severe thrombocytopenia Normocytic anemia Diabetes mellitus, type II with neuropathy SLE Depression/anxiety Hepatosplenomegaly Obesity  Discharge Condition: Stable  Diet recommendation: Soft  Filed Weights   10/11/20 0253 10/12/20 0500 10/13/20 0500  Weight: 88.5 kg 89.7 kg 91.3 kg    History of present illness:  on 10/10/2020 by Ms. Vernona Rieger Gleason, PA 59 year old female with DM, asthma, nephrolithiasis, lupus (no flare in last 5 years/ not on home, depression/ anxiety, arthritis, and COVID-19 (positive on 08/15/2020) presenting to ER with complaints of not feeling well since Thursday. She thinks she passed a kidney stone on the right and since has continued to fell bad with suprapubic abdominal pain, bilateral flank pain, back pain, nausea and vomiting, decreased urinary output, dysuria, some confusion, generalized weakness, diarrhea, complaints of feeling hot, intermittent midsternum chest pain, and mild shortness of breath.   Hospital Course:  Septic shock secondary to Klebsiella oxytocin and Enterobacter cloacae bacteremia/UTI -Sepsis was present on admission -Shock has now resolved  (patient did require pressors on admission) -Blood and urine cultures from 11/7: Positive for Klebsiella and Enterobacter -Repeat blood cultures on 11/10 showed no growth to date -Currently on ciprofloxacin 500 mg twice daily. Will need 10 to 14 days of antibiotics -Interventional radiology consulted, status post nephrostomy drain placement.  Discussed with Dr. Fredia Sorrow, continue daily flushes once patient is home.  Patient will need outpatient follow-up with intern clinic in 2 weeks.  Husband tells me that he has been trained on how to do this -Patient will need to follow-up with urology, Dr. Laverle Patter  Acute kidney injury -Creatinine peaked to 3.49 -Suspect secondary to obstruction as well as sepsis -Creatinine down to 0.9  Elevated troponin/NSTEMI -High-sensitivity troponin peaked at 898 -Suspect secondary to septic shock -Echocardiogram 10/10/2020 showed an EF of 55 to 60%, LV diastolic parameters normal. Mildly elevated pulmonary artery systolic pressure  Nausea and vomiting -Suspect secondary to infection as well as shock -Denies current nausea or vomiting -Was able to tolerate soft diet but continues to have nausea intermittently -Continue antiemetics as needed  Acute metabolic encephalopathy  -Resolved, patient currently alert and oriented x3 -Suspect secondary to septic shock  Severe thrombocytopenia -Likely secondary to septic shock -DIC panel was unremarkable -Platelets improved to 113 -Repeat CBC in 1 week  Normocytic anemia -Hemoglobin currently 103. Baseline approximately 12 -Suspect drop likely dilutional  Diabetes mellitus, type II with neuropathy -Controlled -Hemoglobin A1c 5.8 -Takes Metformin at home however was held during hospitalization (not seen on [facility]- however patient states she takes 500mg  BID), patient can continue this on discharge -Continue gabapentin  SLE -Currently not on any treatment  Depression/anxiety -Continue Xanax and  Zoloft  Hepatosplenomegaly -Noted on CT imaging -? Secondary to [facility] as patient denies alcohol use -Patient will need to  follow-up with gastroenterology as an outpatient  Obesity -BMI 34.5 -Status post gastric banding -Patient to follow up with her primary care physician to discuss lifestyle modifications  Hypokalemia/hypomagnesemia -Replaced, patient should repeat BMP and magnesium level in 1 week  Consultants PCCM Urology Interventional radiology  Procedures  Echocardiogram IR guided nephrostomy tube placement   Discharge Exam: Vitals:   10/16/20 0400 10/16/20 0753  BP: 124/71 127/69  Pulse: 79 75  Resp: 16 17  Temp: 98.9 F (37.2 C) 98.5 F (36.9 C)  SpO2: 96% 97%     General: Well developed, well nourished, NAD, appears stated age  HEENT: NCAT, mucous membranes moist.  Cardiovascular: S1 S2 auscultated, RRR  Respiratory: Clear to auscultation bilaterally with equal chest rise  Abdomen: Soft, obese, nontender, nondistended, + bowel sounds  Extremities: warm dry without cyanosis clubbing or edema  Neuro: AAOx3, nonfocal  Psych: appropriate mood and affect, pleasant  Discharge Instructions Discharge Instructions    Diet - low sodium heart healthy   Complete by: As directed    Discharge instructions   Complete by: As directed    Patient will be discharged to home.  Patient will need to follow up with primary care provider within one week of discharge, repeat CBC, BMP, and magnesium.  Follow-up with the drain clinic in 2 weeks.  Continue daily flushes.  Follow-up with Dr. Laverle Patter, urology in 2 weeks.  Patient should continue medications as prescribed.  Patient should follow a soft diet.   Discharge wound care:   Complete by: As directed    Keep clean and dry     Allergies as of 10/16/2020      Reactions   Codeine Itching   Strawberry Extract Hives, Itching, Other (See Comments)   Can't breathe   Vibramycin [doxycycline Calcium] Nausea And  Vomiting      Medication List    STOP taking these medications   benzonatate 100 MG capsule Commonly known as: TESSALON   HYDROcodone-acetaminophen 5-325 MG tablet Commonly known as: NORCO/VICODIN   oxyCODONE-acetaminophen 5-325 MG tablet Commonly known as: Percocet   pantoprazole 20 MG tablet Commonly known as: PROTONIX   tamsulosin 0.4 MG Caps capsule Commonly known as: Flomax     TAKE these medications   albuterol 108 (90 Base) MCG/ACT inhaler Commonly known as: VENTOLIN HFA Inhale 1 puff into the lungs every 4 (four) hours as needed for wheezing or shortness of breath.   ALPRAZolam 0.5 MG tablet Commonly known as: XANAX Take 0.5 tablets (0.25 mg total) by mouth 2 (two) times daily as needed for anxiety. What changed: how much to take   aspirin EC 81 MG tablet Take 81 mg by mouth daily as needed (chest pain).   cetirizine 10 MG tablet Commonly known as: ZYRTEC Take 10 mg by mouth at bedtime.   ciprofloxacin 500 MG tablet Commonly known as: CIPRO Take 1 tablet (500 mg total) by mouth 2 (two) times daily for 11 days.   gabapentin 300 MG capsule Commonly known as: NEURONTIN Take 300 mg by mouth 2 (two) times daily.   ibuprofen 200 MG tablet Commonly known as: ADVIL Take 400 mg by mouth every 6 (six) hours as needed for moderate pain (right knee pain).   ondansetron 4 MG disintegrating tablet Commonly known as: Zofran ODT 4mg  ODT q4 hours prn nausea/vomit   sertraline 100 MG tablet Commonly known as: ZOLOFT Take 150 mg by mouth at bedtime.   traMADol 50 MG tablet Commonly known as: ULTRAM Take 1 tablet (50  mg total) by mouth every 6 (six) hours as needed for moderate pain.            Discharge Care Instructions  (From admission, onward)         Start     Ordered   10/16/20 0000  Discharge wound care:       Comments: Keep clean and dry   10/16/20 0951         Allergies  Allergen Reactions  . Codeine Itching  . Strawberry Extract Hives,  Itching and Other (See Comments)    Can't breathe  . Vibramycin [Doxycycline Calcium] Nausea And Vomiting    Follow-up Information    [facility]. Go on 10/26/2020.   Why: Hospital follow up scheduled for 10/26/2020 at 4:10 pm with Georgian Co PA Contact information: [address] [place] [address] [place] [address] [phone]       Irish Lack, MD. Schedule an appointment as soon as possible for a visit in 2 week(s).   Specialties: Interventional Radiology, Radiology Why: [facility] clinic Contact information: [address] [place] [address] [phone]        Heloise Purpura, MD. Schedule an appointment as soon as possible for a visit in 1 week(s).   Specialty: Urology Why: Hospital follow up Contact information: [address] [place] [address] [phone]                The results of significant diagnostics from this hospitalization (including imaging, microbiology, ancillary and laboratory) are listed below for reference.    Significant Diagnostic Studies: DG Chest Port 1 View  Result Date: 10/09/2020 CLINICAL DATA:  Questionable sepsis EXAM: PORTABLE CHEST 1 VIEW COMPARISON:  March 10, 2017 FINDINGS: The heart size and mediastinal contours are within normal limits. There is mild prominence of the central pulmonary vasculature. The visualized skeletal structures are unremarkable. IMPRESSION: Mild pulmonary vascular congestion Electronically Signed   By: Jonna Clark M.D.   On: 10/09/2020 18:36   ECHOCARDIOGRAM COMPLETE  Result Date: 10/10/2020    ECHOCARDIOGRAM REPORT   Patient Name:   MINA BABULA Date of Exam: 10/10/2020 Medical Rec #:  [id number]     Height:       64.0 in Accession #:    [id number]    Weight:       195.0 lb Date of Birth:  [birth year]      BSA:          1.935 m Patient Age:    59 years      BP:           106/77 mmHg Patient Gender: F             HR:           105 bpm. Exam Location:   Inpatient Procedure: 2D Echo, Color Doppler, Cardiac Doppler and Intracardiac            Opacification Agent Indications:    Septic Shock  History:        Patient has no prior history of Echocardiogram examinations.                 Risk Factors:Diabetes and COVID+ 08/2020.  Sonographer:    Irving Burton Senior RDCS Referring Phys: [phone] PAULA B SIMPSON IMPRESSIONS  1. Left ventricular ejection fraction, by estimation, is 55 to 60%. The left ventricle has normal function. The left ventricle has no regional wall motion abnormalities. Left ventricular diastolic parameters were  normal.  2. Right ventricular systolic function is normal. The right ventricular size is normal. There is mildly elevated pulmonary artery systolic pressure.  3. The mitral valve is normal in structure. No evidence of mitral valve regurgitation. No evidence of mitral stenosis.  4. The aortic valve is normal in structure. Aortic valve regurgitation is not visualized. Mild to moderate aortic valve sclerosis/calcification is present, without any evidence of aortic stenosis.  5. The inferior vena cava is normal in size with greater than 50% respiratory variability, suggesting right atrial pressure of 3 mmHg. FINDINGS  Left Ventricle: Left ventricular ejection fraction, by estimation, is 55 to 60%. The left ventricle has normal function. The left ventricle has no regional wall motion abnormalities. Definity contrast agent was given IV to delineate the left ventricular  endocardial borders. The left ventricular internal cavity size was normal in size. There is no left ventricular hypertrophy. Left ventricular diastolic parameters were normal. Right Ventricle: The right ventricular size is normal. No increase in right ventricular wall thickness. Right ventricular systolic function is normal. There is mildly elevated pulmonary artery systolic pressure. The tricuspid regurgitant velocity is 2.39  m/s, and with an assumed right atrial pressure of 15 mmHg, the  estimated right ventricular systolic pressure is 37.8 mmHg. Left Atrium: Left atrial size was normal in size. Right Atrium: Right atrial size was normal in size. Pericardium: There is no evidence of pericardial effusion. Mitral Valve: The mitral valve is normal in structure. There is moderate thickening of the mitral valve leaflet(s). There is moderate calcification of the mitral valve leaflet(s). No evidence of mitral valve regurgitation. No evidence of mitral valve stenosis. Tricuspid Valve: The tricuspid valve is normal in structure. Tricuspid valve regurgitation is trivial. No evidence of tricuspid stenosis. Aortic Valve: The aortic valve is normal in structure. Aortic valve regurgitation is not visualized. Mild to moderate aortic valve sclerosis/calcification is present, without any evidence of aortic stenosis. Pulmonic Valve: The pulmonic valve was normal in structure. Pulmonic valve regurgitation is not visualized. No evidence of pulmonic stenosis. Aorta: The aortic root is normal in size and structure. Venous: The inferior vena cava is normal in size with greater than 50% respiratory variability, suggesting right atrial pressure of 3 mmHg. IAS/Shunts: No atrial level shunt detected by color flow Doppler.  LEFT VENTRICLE PLAX 2D LVIDd:         3.80 cm  Diastology LVIDs:         2.80 cm  LV e' medial:    5.22 cm/s LV PW:         1.10 cm  LV E/e' medial:  11.3 LV IVS:        1.10 cm  LV e' lateral:   6.31 cm/s LVOT diam:     2.00 cm  LV E/e' lateral: 9.4 LV SV:         41 LV SV Index:   21 LVOT Area:     3.14 cm  RIGHT VENTRICLE             IVC RV S prime:     10.00 cm/s  IVC diam: 2.75 cm TAPSE (M-mode): 2.3 cm LEFT ATRIUM           Index       RIGHT ATRIUM           Index LA diam:      3.40 cm 1.76 cm/m  RA Area:     17.90 cm LA Vol (A2C): 45.5 ml 23.51 ml/m RA Volume:  52.90 ml  27.33 ml/m LA Vol (A4C): 62.1 ml 32.09 ml/m  AORTIC VALVE LVOT Vmax:   73.10 cm/s LVOT Vmean:  59.100 cm/s LVOT VTI:     0.130 m  AORTA Ao Root diam: 3.10 cm Ao Asc diam:  3.10 cm MITRAL VALVE               TRICUSPID VALVE MV Area (PHT): 3.19 cm    TR Peak grad:   22.8 mmHg MV Decel Time: 238 msec    TR Vmax:        239.00 cm/s MV E velocity: 59.10 cm/s MV A velocity: 60.80 cm/s  SHUNTS MV E/A ratio:  0.97        Systemic VTI:  0.13 m                            Systemic Diam: 2.00 cm Charlton Haws MD Electronically signed by Charlton Haws MD Signature Date/Time: 10/10/2020/1:14:53 PM    Final    CT Angio Chest/Abd/Pel for Dissection W and/or W/WO  Result Date: 10/09/2020 CLINICAL DATA:  Chest pain.  Concern for aortic dissection. EXAM: CT ANGIOGRAPHY CHEST, ABDOMEN AND PELVIS TECHNIQUE: Non-contrast CT of the chest was initially obtained. Multidetector CT imaging through the chest, abdomen and pelvis was performed using the standard protocol during bolus administration of intravenous contrast. Multiplanar reconstructed images and MIPs were obtained and reviewed to evaluate the vascular anatomy. CONTRAST:  OMNIPAQUE IOHEXOL 350 MG/ML SOLN COMPARISON:  CT dated February 19, 2016. FINDINGS: CTA CHEST FINDINGS Cardiovascular: There is no evidence for a thoracic aortic dissection or aneurysm. Mild atherosclerotic changes are noted. There is no significant pericardial effusion. The heart size is normal. There is no large centrally located pulmonary embolism. Mediastinum/Nodes: --mild mediastinal adenopathy is noted. --mild hilar adenopathy is noted. -- No axillary lymphadenopathy. -- No supraclavicular lymphadenopathy. -- Normal thyroid gland where visualized. -  Unremarkable esophagus. Lungs/Pleura: Evaluation of the lung fields is limited by respiratory motion artifact. There are streaky, linear airspace opacities throughout both lung fields without evidence for large focal infiltrate. There is atelectasis at the lung bases. There is no pneumothorax. No large pleural effusion. Musculoskeletal: No chest wall abnormality. No bony  spinal canal stenosis. Review of the MIP images confirms the above findings. CTA ABDOMEN AND PELVIS FINDINGS VASCULAR Aorta: Normal caliber aorta without aneurysm, dissection, vasculitis or significant stenosis. Celiac: Patent without evidence of aneurysm, dissection, vasculitis or significant stenosis. SMA: Patent without evidence of aneurysm, dissection, vasculitis or significant stenosis. Renals: Both renal arteries are patent without evidence of aneurysm, dissection, vasculitis, fibromuscular dysplasia or significant stenosis. IMA: Patent without evidence of aneurysm, dissection, vasculitis or significant stenosis. Inflow: Patent without evidence of aneurysm, dissection, vasculitis or significant stenosis. Veins: No obvious venous abnormality within the limitations of this arterial phase study. Review of the MIP images confirms the above findings. NON-VASCULAR Hepatobiliary: The liver is enlarged. The caudate lobe is enlarged. Liver surface appears nodular. Normal gallbladder.There is no biliary ductal dilation. Pancreas: Normal contours without ductal dilatation. No peripancreatic fluid collection. Spleen: The spleen is enlarged measuring approximately 15 cm craniocaudad. Adrenals/Urinary Tract: --Adrenal glands: Unremarkable. --Right kidney/ureter: There is mild right-sided hydroureteronephrosis secondary to an obstructing 6 mm stone in the proximal right ureter (axial series 6, image 190). --Left kidney/ureter: No hydronephrosis or radiopaque kidney stones. --Urinary bladder: Unremarkable. Stomach/Bowel: --Stomach/Duodenum: There is a gastric band in place. --Small bowel: Unremarkable. --Colon: Rectosigmoid diverticulosis without acute inflammation. --Appendix:  The patient appears to be status post prior appendectomy. Lymphatic: --No retroperitoneal lymphadenopathy. --No mesenteric lymphadenopathy. --No pelvic or inguinal lymphadenopathy. Reproductive: Status post hysterectomy. No adnexal mass. Other: No  ascites or free air. There is a fat containing umbilical hernia. Musculoskeletal. No acute displaced fractures. Review of the MIP images confirms the above findings. IMPRESSION: 1. No evidence for aortic dissection or aneurysm. 2. Mild right-sided hydroureteronephrosis secondary to an obstructing 6 mm stone in the proximal right ureter. 3. Streaky, linear airspace opacities throughout both lung fields favored to represent atelectasis. 4. 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 5. There is a gastric band in place. 6. Rectosigmoid diverticulosis without acute inflammation. Aortic Atherosclerosis (ICD10-I70.0). Electronically Signed   By: Katherine Mantle M.D.   On: 10/09/2020 20:01   IR NEPHROSTOMY PLACEMENT RIGHT  Result Date: 10/10/2020 CLINICAL DATA:  Obstructing proximal right ureteral calculus and sepsis. The patient presents for right percutaneous nephrostomy tube placement for right renal decompression and treatment of presumed sepsis source. EXAM: 1. ULTRASOUND GUIDANCE FOR PUNCTURE OF THE RIGHT RENAL COLLECTING SYSTEM. 2. LEFT PERCUTANEOUS NEPHROSTOMY TUBE PLACEMENT. COMPARISON:  CTA on 10/09/2020 ANESTHESIA/SEDATION: 0.5 mg IV Versed; 75 mcg IV Fentanyl. Total Moderate Sedation Time 34 minutes. The patient's level of consciousness and physiologic status were continuously monitored during the procedure by Radiology nursing. CONTRAST:  8 ml Omnipaque 300 MEDICATIONS: 2 g IV Ancef. Antibiotic was administered in an appropriate time frame prior to skin puncture. FLUOROSCOPY TIME:  9 minutes and 48 seconds. 135 mGy. PROCEDURE: The procedure, risks, benefits, and alternatives were explained to the patient. Questions regarding the procedure were encouraged and answered. The patient understands and consents to the procedure. A time-out was performed prior to initiating the procedure. Ultrasound was used to localize the right kidney. The right flank region was prepped with  chlorhexidine in a sterile fashion, and a sterile drape was applied covering the operative field. A sterile gown and sterile gloves were used for the procedure. Local anesthesia was provided with 1% Lidocaine. Ultrasound was used to localize the right kidney. Under direct ultrasound guidance, a 21 gauge needle was advanced into the renal collecting system. Ultrasound image documentation was performed. Aspiration of urine sample was performed followed by contrast injection. A transitional dilator was advanced over a guidewire. Guidewire advancement was then performed via a 5 French catheter. Percutaneous tract dilatation was then performed over the guidewire. A 10-French percutaneous nephrostomy tube was then advanced and formed in the collecting system. Catheter position was confirmed by fluoroscopy after contrast injection. The catheter was secured at the skin with a Prolene retention suture and Stat-Lock device. A gravity bag was placed. COMPLICATIONS: None. FINDINGS: Initial ultrasound demonstrates mild right-sided hydronephrosis and attempt at opacification of the collecting system under ultrasound guidance with a 21 gauge needle was unsuccessful. Under fluoroscopy, there was residual faint opacification of the collecting system from excreted contrast administered at the time of CTA yesterday. After lower pole puncture under fluoroscopy, access was gained to the collecting system allowing placement of a 5 French catheter followed by the nephrostomy tube. The nephrostomy tube is formed in the renal pelvis. There was bleeding in the collecting system at the time of tube placement with clot present in the collecting system with contrast injection. IMPRESSION: Right-sided percutaneous nephrostomy tube placement with placement of 10 French catheter formed in the renal pelvis. There was some bleeding at the time of tube placement with clot formation in the collecting system. The tube will be flushed and  irrigated. The  nephrostomy tube was connected to a gravity drainage bag. Electronically Signed   By: Irish Lack M.D.   On: 10/10/2020 16:50    Microbiology: Recent Results (from the past 240 hour(s))  Blood Culture (routine x 2)     Status: Abnormal   Collection Time: 10/09/20  5:48 PM   Specimen: BLOOD LEFT HAND  Result Value Ref Range Status   Specimen Description BLOOD LEFT HAND  Final   Special Requests   Final    BOTTLES DRAWN AEROBIC AND ANAEROBIC Blood Culture results may not be optimal due to an inadequate volume of blood received in culture bottles   Culture  Setup Time   Final    GRAM NEGATIVE RODS IN BOTH AEROBIC AND ANAEROBIC BOTTLES CRITICAL VALUE NOTED.  VALUE IS CONSISTENT WITH PREVIOUSLY REPORTED AND CALLED VALUE. Performed at [facility], [address], [place], [place] [address]    Culture ENTEROBACTER CLOACAE KLEBSIELLA OXYTOCA  (A)  Final   Report Status 10/13/2020 FINAL  Final   Organism ID, Bacteria ENTEROBACTER CLOACAE  Final   Organism ID, Bacteria KLEBSIELLA OXYTOCA  Final      Susceptibility   Enterobacter cloacae - MIC*    CEFAZOLIN >=64 RESISTANT Resistant     CEFEPIME <=0.12 SENSITIVE Sensitive     CEFTAZIDIME <=1 SENSITIVE Sensitive     CIPROFLOXACIN <=0.25 SENSITIVE Sensitive     GENTAMICIN <=1 SENSITIVE Sensitive     IMIPENEM <=0.25 SENSITIVE Sensitive     TRIMETH/SULFA <=20 SENSITIVE Sensitive     PIP/TAZO <=4 SENSITIVE Sensitive     * ENTEROBACTER CLOACAE   Klebsiella oxytoca - MIC*    AMPICILLIN RESISTANT Resistant     CEFAZOLIN <=4 SENSITIVE Sensitive     CEFEPIME <=0.12 SENSITIVE Sensitive     CEFTAZIDIME <=1 SENSITIVE Sensitive     CEFTRIAXONE <=0.25 SENSITIVE Sensitive     CIPROFLOXACIN <=0.25 SENSITIVE Sensitive     GENTAMICIN <=1 SENSITIVE Sensitive     IMIPENEM <=0.25 SENSITIVE Sensitive     TRIMETH/SULFA <=20 SENSITIVE Sensitive     AMPICILLIN/SULBACTAM 4 SENSITIVE Sensitive     PIP/TAZO <=4 SENSITIVE Sensitive     * KLEBSIELLA  OXYTOCA  Blood Culture (routine x 2)     Status: Abnormal   Collection Time: 10/09/20  5:50 PM   Specimen: BLOOD RIGHT FOREARM  Result Value Ref Range Status   Specimen Description BLOOD RIGHT FOREARM  Final   Special Requests   Final    BOTTLES DRAWN AEROBIC AND ANAEROBIC Blood Culture results may not be optimal due to an inadequate volume of blood received in culture bottles   Culture  Setup Time   Final    GRAM NEGATIVE RODS IN BOTH AEROBIC AND ANAEROBIC BOTTLES Organism ID to follow CRITICAL RESULT CALLED TO, READ BACK BY AND VERIFIED WITH: Gay Filler PharmD 9:30 10/10/20 ([email or web address])    Culture (A)  Final    KLEBSIELLA OXYTOCA ENTEROBACTER CLOACAE SUSCEPTIBILITIES PERFORMED ON PREVIOUS CULTURE WITHIN THE LAST 5 DAYS. Performed at [facility] [facility], [address], [place], [place] [address]    Report Status 10/13/2020 FINAL  Final  Blood Culture ID Panel (Reflexed)     Status: Abnormal   Collection Time: 10/09/20  5:50 PM  Result Value Ref Range Status   Enterococcus faecalis NOT DETECTED NOT DETECTED Final   Enterococcus Faecium NOT DETECTED NOT DETECTED Final   Listeria monocytogenes NOT DETECTED NOT DETECTED Final   Staphylococcus species NOT DETECTED NOT DETECTED  Final   Staphylococcus aureus (BCID) NOT DETECTED NOT DETECTED Final   Staphylococcus epidermidis NOT DETECTED NOT DETECTED Final   Staphylococcus lugdunensis NOT DETECTED NOT DETECTED Final   Streptococcus species NOT DETECTED NOT DETECTED Final   Streptococcus agalactiae NOT DETECTED NOT DETECTED Final   Streptococcus pneumoniae NOT DETECTED NOT DETECTED Final   Streptococcus pyogenes NOT DETECTED NOT DETECTED Final   A.calcoaceticus-baumannii NOT DETECTED NOT DETECTED Final   Bacteroides fragilis NOT DETECTED NOT DETECTED Final   Enterobacterales DETECTED (A) NOT DETECTED Final    Comment: CRITICAL RESULT CALLED TO, READ BACK BY AND VERIFIED WITH: Gay Filler PharmD 9:30 10/10/20 ([email or web address])    Enterobacter cloacae  complex DETECTED (A) NOT DETECTED Final    Comment: CRITICAL RESULT CALLED TO, READ BACK BY AND VERIFIED WITH: Gay Filler PharmD 9:30 10/10/20 ([email or web address])    Escherichia coli NOT DETECTED NOT DETECTED Final   Klebsiella aerogenes NOT DETECTED NOT DETECTED Final   Klebsiella oxytoca DETECTED (A) NOT DETECTED Final    Comment: CRITICAL RESULT CALLED TO, READ BACK BY AND VERIFIED WITH: Gay Filler PharmD 9:30 10/10/20 ([email or web address])    Klebsiella pneumoniae NOT DETECTED NOT DETECTED Final   Proteus species NOT DETECTED NOT DETECTED Final   Salmonella species NOT DETECTED NOT DETECTED Final   Serratia marcescens NOT DETECTED NOT DETECTED Final   Haemophilus influenzae NOT DETECTED NOT DETECTED Final   Neisseria meningitidis NOT DETECTED NOT DETECTED Final   Pseudomonas aeruginosa NOT DETECTED NOT DETECTED Final   Stenotrophomonas maltophilia NOT DETECTED NOT DETECTED Final   Candida albicans NOT DETECTED NOT DETECTED Final   Candida auris NOT DETECTED NOT DETECTED Final   Candida glabrata NOT DETECTED NOT DETECTED Final   Candida krusei NOT DETECTED NOT DETECTED Final   Candida parapsilosis NOT DETECTED NOT DETECTED Final   Candida tropicalis NOT DETECTED NOT DETECTED Final   Cryptococcus neoformans/gattii NOT DETECTED NOT DETECTED Final   CTX-M ESBL NOT DETECTED NOT DETECTED Final   Carbapenem resistance IMP NOT DETECTED NOT DETECTED Final   Carbapenem resistance KPC NOT DETECTED NOT DETECTED Final   Carbapenem resistance NDM NOT DETECTED NOT DETECTED Final   Carbapenem resist OXA 48 LIKE NOT DETECTED NOT DETECTED Final   Carbapenem resistance VIM NOT DETECTED NOT DETECTED Final    Comment: Performed at [facility], [address] [place], [place] [address]  Urine culture     Status: Abnormal   Collection Time: 10/09/20  8:50 PM   Specimen: In/Out Cath Urine  Result Value Ref Range Status   Specimen Description IN/OUT CATH URINE  Final   Special Requests   Final    NONE Performed at [facility], [address], [place], [place] [address]    Culture (A)  Final    >=100,000 COLONIES/mL ENTEROBACTER CLOACAE >=100,000 COLONIES/mL KLEBSIELLA OXYTOCA    Report Status 10/12/2020 FINAL  Final   Organism ID, Bacteria ENTEROBACTER CLOACAE (A)  Final   Organism ID, Bacteria KLEBSIELLA OXYTOCA (A)  Final      Susceptibility   Enterobacter cloacae - MIC*    CEFAZOLIN >=64 RESISTANT Resistant     CEFEPIME <=0.12 SENSITIVE Sensitive     CIPROFLOXACIN <=0.25 SENSITIVE Sensitive     GENTAMICIN <=1 SENSITIVE Sensitive     IMIPENEM <=0.25 SENSITIVE Sensitive     NITROFURANTOIN 64 INTERMEDIATE Intermediate     TRIMETH/SULFA <=20 SENSITIVE Sensitive     PIP/TAZO <=4 SENSITIVE Sensitive     * >=100,000 COLONIES/mL ENTEROBACTER CLOACAE  Klebsiella oxytoca - MIC*    AMPICILLIN RESISTANT Resistant     CEFAZOLIN <=4 SENSITIVE Sensitive     CEFEPIME <=0.12 SENSITIVE Sensitive     CEFTRIAXONE <=0.25 SENSITIVE Sensitive     CIPROFLOXACIN <=0.25 SENSITIVE Sensitive     GENTAMICIN <=1 SENSITIVE Sensitive     IMIPENEM <=0.25 SENSITIVE Sensitive     NITROFURANTOIN <=16 SENSITIVE Sensitive     TRIMETH/SULFA <=20 SENSITIVE Sensitive     AMPICILLIN/SULBACTAM 4 SENSITIVE Sensitive     PIP/TAZO <=4 SENSITIVE Sensitive     * >=100,000 COLONIES/mL KLEBSIELLA OXYTOCA  Respiratory Panel by RT PCR (Flu A&B, Covid) - Nasopharyngeal Swab     Status: Abnormal   Collection Time: 10/09/20  9:01 PM   Specimen: Nasopharyngeal Swab  Result Value Ref Range Status   SARS Coronavirus 2 by RT PCR POSITIVE (A) NEGATIVE Final    Comment: RESULT CALLED TO, READ BACK BY AND VERIFIED WITH: A. DAVISON,RN 2228 10/09/2020 T. TYSOR (NOTE) SARS-CoV-2 target nucleic acids are DETECTED.  SARS-CoV-2 RNA is generally detectable in upper respiratory specimens  during the acute phase of infection. Positive results are indicative of the presence of the identified virus, but do not rule out bacterial infection or  co-infection with other pathogens not detected by the test. Clinical correlation with patient history and other diagnostic information is necessary to determine patient infection status. The expected result is Negative.  Fact Sheet for Patients:  https://www.moore.com/  Fact Sheet for Healthcare Providers: https://www.young.biz/  This test is not yet approved or cleared by the [place] FDA and  has been authorized for detection and/or diagnosis of SARS-CoV-2 by FDA under an Emergency Use Authorization (EUA).  This EUA will remain in effect (meaning this test can  be used) for the duration of  the COVID-19 declaration under Section 564(b)(1) of the Act, 21 U.S.C. section 360bbb-3(b)(1), unless the authorization is terminated or revoked sooner.      Influenza A by PCR NEGATIVE NEGATIVE Final   Influenza B by PCR NEGATIVE NEGATIVE Final    Comment: (NOTE) The Xpert Xpress SARS-CoV-2/FLU/RSV assay is intended as an aid in  the diagnosis of influenza from Nasopharyngeal swab specimens and  should not be used as a sole basis for treatment. Nasal washings and  aspirates are unacceptable for Xpert Xpress SARS-CoV-2/FLU/RSV  testing.  Fact Sheet for Patients: https://www.moore.com/  Fact Sheet for Healthcare Providers: https://www.young.biz/  This test is not yet approved or cleared by the [place] FDA and  has been authorized for detection and/or diagnosis of SARS-CoV-2 by  FDA under an Emergency Use Authorization (EUA). This EUA will remain  in effect (meaning this test can be used) for the duration of the  Covid-19 declaration under Section 564(b)(1) of the Act, 21  U.S.C. section 360bbb-3(b)(1), unless the authorization is  terminated or revoked. Performed at [facility], [address], [place], [place] [address]   MRSA PCR Screening     Status: None   Collection Time: 10/10/20  12:30 AM   Specimen: Nasal Mucosa; Nasopharyngeal  Result Value Ref Range Status   MRSA by PCR NEGATIVE NEGATIVE Final    Comment:        The GeneXpert MRSA Assay (FDA approved for NASAL specimens only), is one component of a comprehensive MRSA colonization surveillance program. It is not intended to diagnose MRSA infection nor to guide or monitor treatment for MRSA infections. Performed at [facility], [address] [place], [place] [address]  C Difficile Quick Screen w PCR reflex     Status: None   Collection Time: 10/10/20  8:08 AM   Specimen: STOOL  Result Value Ref Range Status   C Diff antigen NEGATIVE NEGATIVE Final   C Diff toxin NEGATIVE NEGATIVE Final   C Diff interpretation No C. difficile detected.  Final    Comment: Performed at [facility], [address] [place], [place] [address]  Culture, blood (routine x 2)     Status: None (Preliminary result)   Collection Time: 10/12/20  8:02 AM   Specimen: BLOOD  Result Value Ref Range Status   Specimen Description BLOOD RIGHT ANTECUBITAL  Final   Special Requests   Final    BOTTLES DRAWN AEROBIC AND ANAEROBIC Blood Culture adequate volume   Culture   Final    NO GROWTH 3 DAYS Performed at [facility], [address] [place], [place] [address]    Report Status PENDING  Incomplete  Culture, blood (routine x 2)     Status: None (Preliminary result)   Collection Time: 10/12/20  8:05 AM   Specimen: BLOOD  Result Value Ref Range Status   Specimen Description BLOOD RIGHT ANTECUBITAL  Final   Special Requests   Final    BOTTLES DRAWN AEROBIC AND ANAEROBIC Blood Culture adequate volume   Culture   Final    NO GROWTH 3 DAYS Performed at [facility], [address], [place], [place] [address]    Report Status PENDING  Incomplete     Labs: Basic Metabolic Panel: Recent Labs  Lab 10/11/20 0144 10/11/20 1415 10/12/20 0440 10/13/20 0801 10/14/20 0834 10/15/20 0832 10/16/20 0238  NA    < >  --  138 140 138 139 140  K   < >  --  4.1 3.7 3.6 3.4* 3.3*  CL   < >  --  105 104 103 106 107  CO2   < >  --  GLUCOSE   < >  --  152* 124* 119* 135* 124*  BUN   < >  --  63* 50* 38* 23 15  CREATININE   < >  --  1.94* 1.28* 1.04* 0.90 0.81  CALCIUM   < >  --  8.5* 8.3* 8.1* 7.8* 7.4*  MG  --  1.6* 2.4  --   --   --  1.3*   < > = values in this interval not displayed.   Liver Function Tests: Recent Labs  Lab 10/09/20 1958 10/10/20 0429 10/11/20 0144 10/13/20 0801  AST 43* 49* 39 31  ALT 30 33 30 21  ALKPHOS 51 65 158* 130*  BILITOT 0.8 1.0 1.1 1.1  PROT 5.4* 6.1* 6.1* 5.9*  ALBUMIN 2.3* 2.6* 2.4* 1.9*   No results for input(s): LIPASE, AMYLASE in the last 168 hours. Recent Labs  Lab 10/11/20 0749  AMMONIA 32   CBC: Recent Labs  Lab 10/09/20 1750 10/10/20 0133 10/12/20 1319 10/13/20 0801 10/14/20 1145 10/15/20 0832 10/16/20 0238  WBC 6.6   < > 9.0 10.4 11.7* 10.3 8.7  NEUTROABS 5.3  --  7.5  --   --   --   --   HGB 12.5   < > 11.1* 10.6* 10.1* 10.6* 10.3*  HCT 39.1   < > 33.7* 32.7* 30.8* 33.0* 32.3*  MCV 87.7   < > 84.7 84.3 85.8 86.4 86.8  PLT 42*   < >  23* 26* 59* PLATELET CLUMPS NOTED ON SMEAR, COUNT APPEARS DECREASED 113*   < > = values in this interval not displayed.   Cardiac Enzymes: No results for input(s): CKTOTAL, CKMB, CKMBINDEX, TROPONINI in the last 168 hours. BNP: BNP (last 3 results) Recent Labs    10/09/20 1958  BNP 1,372.1*    ProBNP (last 3 results) No results for input(s): PROBNP in the last 8760 hours.  CBG: Recent Labs  Lab 10/15/20 1619 10/15/20 1945 10/16/20 0028 10/16/20 0359 10/16/20 0750  GLUCAP 104* 140* 131* 117* 114*       Signed:  Keshawn Fiorito  [facility] 10/16/2020, 9:56 AM

## 2020-10-17 ENCOUNTER — Other Ambulatory Visit: Payer: Self-pay

## 2020-10-17 ENCOUNTER — Emergency Department (HOSPITAL_COMMUNITY)
Admission: EM | Admit: 2020-10-17 | Discharge: 2020-10-17 | Disposition: A | Discharge status: Home / Self Care | Payer: Self-pay | Attending: Emergency Medicine | Admitting: Emergency Medicine

## 2020-10-17 ENCOUNTER — Emergency Department (HOSPITAL_COMMUNITY): Payer: Self-pay

## 2020-10-17 DIAGNOSIS — Y738 Miscellaneous gastroenterology and urology devices associated with adverse incidents, not elsewhere classified: Secondary | ICD-10-CM | POA: Insufficient documentation

## 2020-10-17 DIAGNOSIS — J45909 Unspecified asthma, uncomplicated: Secondary | ICD-10-CM | POA: Insufficient documentation

## 2020-10-17 DIAGNOSIS — T83012A Breakdown (mechanical) of nephrostomy catheter, initial encounter: Secondary | ICD-10-CM | POA: Insufficient documentation

## 2020-10-17 DIAGNOSIS — Z79899 Other long term (current) drug therapy: Secondary | ICD-10-CM | POA: Insufficient documentation

## 2020-10-17 DIAGNOSIS — T83022A Displacement of nephrostomy catheter, initial encounter: Secondary | ICD-10-CM

## 2020-10-17 DIAGNOSIS — E119 Type 2 diabetes mellitus without complications: Secondary | ICD-10-CM | POA: Insufficient documentation

## 2020-10-17 DIAGNOSIS — T83098A Other mechanical complication of other indwelling urethral catheter, initial encounter: Secondary | ICD-10-CM

## 2020-10-17 HISTORY — PX: IR NEPHROSTOMY EXCHANGE RIGHT: IMG6070

## 2020-10-17 LAB — CULTURE, BLOOD (ROUTINE X 2)
Culture: NO GROWTH
Culture: NO GROWTH
Special Requests: ADEQUATE
Special Requests: ADEQUATE

## 2020-10-17 MED ORDER — IOHEXOL 300 MG/ML  SOLN
50.0000 mL | Freq: Once | INTRAMUSCULAR | Status: AC | PRN
  Administered 2020-10-17: 10 mL

## 2020-10-17 MED ORDER — LIDOCAINE HCL 1 % IJ SOLN
INTRAMUSCULAR | Status: AC
  Filled 2020-10-17: qty 20

## 2020-10-17 MED ORDER — LIDOCAINE HCL 1 % IJ SOLN
INTRAMUSCULAR | Status: DC | PRN
  Administered 2020-10-17: 10 mL

## 2020-10-17 NOTE — Procedures (Signed)
Interventional Radiology Procedure Note  Procedure: Right nephrostomy tube check and exchange  Findings: Please refer to procedural dictation for full description. Cap fractured on indwelling tube, pigtail portion retracted into minor calyx.  Exchanged over wire for new, 10.2 Fr nephrostomy, pigtail portion in renal pelvis. Persistent obstructed proximal right ureter.  Complications: None immediate  Estimated Blood Loss: <5 mL  Recommendations: Keep to bag drainage. Follow up in 4 weeks for routine check/change.   Marliss Coots, MD Pager: [phone]

## 2020-10-17 NOTE — ED Notes (Signed)
PT transported to IR 

## 2020-10-17 NOTE — ED Provider Notes (Signed)
Nephrostomy replaced by IR.  Pt without complaints and is ready to go home.   Linwood Dibbles, MD 10/17/20 [phone]

## 2020-10-17 NOTE — ED Provider Notes (Signed)
[facility] EMERGENCY DEPARTMENT Provider Note   CSN: [id number] Arrival date & time: 10/17/20  0100     History Chief Complaint  Patient presents with  . Post-op Problem    Shirley Sullivan is a 59 y.o. female.  HPI    Patient presents with malfunctioning nephrostomy tube.  Patient was just discharged from the hospital for sepsis.  She had a nephrostomy tube on the right side.  She reports it was freely draining prior to discharge.  After going home it started draining.  Her husband attempted to flush it with saline, and appeared to break. No other new complaints.  No fevers or vomiting.  No chest pain shortness of breath.  No abdominal pain.  She is making urine.  She does report fatigue. Her course is stable. Past Medical History:  Diagnosis Date  . Allergy   . Anxiety   . Arthritis   . Asthma    perfumes triggers attacks. Has inhalers for rescue  . COVID-19   . Depression   . Diabetes mellitus without complication (HCC)   . History of kidney stones 1998, 2015  . Lupus [facility])     Patient Active Problem List   Diagnosis Date Noted  . Acute renal failure (HCC)   . Hydronephrosis   . Septic shock (HCC) 10/09/2020    Past Surgical History:  Procedure Laterality Date  . ABDOMINAL HYSTERECTOMY  2004  . APPENDECTOMY  1992  . CYSTOSCOPY WITH RETROGRADE PYELOGRAM, URETEROSCOPY AND STENT PLACEMENT Left 08/11/2014   Procedure: CYSTOSCOPY WITH RETROGRADE PYELOGRAM, URETEROSCOPY AND STENT PLACEMENT,  DIGITAL FLEXIBLE URETEROSCOPE;  Surgeon: Heloise Purpura, MD;  Location: [facility];  Service: Urology;  Laterality: Left;  request digital flexible ureteroscope  . CYSTOSCOPY WITH RETROGRADE PYELOGRAM, URETEROSCOPY AND STENT PLACEMENT Left 08/23/2014   Procedure: CYSTOSCOPY WITH RETROGRADE PYELOGRAM, URETEROSCOPY AND STENT EXCHANGE;  Surgeon: Heloise Purpura, MD;  Location: [facility];  Service: Urology;  Laterality: Left;  . HOLMIUM LASER APPLICATION Left 08/23/2014    Procedure: HOLMIUM LASER APPLICATION;  Surgeon: Heloise Purpura, MD;  Location: [facility];  Service: Urology;  Laterality: Left;  . IR NEPHROSTOMY PLACEMENT RIGHT  10/10/2020  . ROTATOR CUFF REPAIR Right 2007     OB History   No obstetric history on file.     Family History  Problem Relation Age of Onset  . Hyperlipidemia Mother     Social History   Tobacco Use  . Smoking status: Never Smoker  . Smokeless tobacco: Never Used  Substance Use Topics  . Alcohol use: No    Alcohol/week: 0.0 standard drinks  . Drug use: No    Home Medications Prior to Admission medications   Medication Sig Start Date End Date Taking? Authorizing Provider  albuterol (PROVENTIL HFA;VENTOLIN HFA) 108 (90 BASE) MCG/ACT inhaler Inhale 1 puff into the lungs every 4 (four) hours as needed for wheezing or shortness of breath. 01/20/15   Andrena Mews, DO  ALPRAZolam Prudy Feeler) 0.5 MG tablet Take 0.5 tablets (0.25 mg total) by mouth 2 (two) times daily as needed for anxiety. 10/16/20   Edsel Petrin, DO  aspirin EC 81 MG tablet Take 81 mg by mouth daily as needed (chest pain).     [provider]  cetirizine (ZYRTEC) 10 MG tablet Take 10 mg by mouth at bedtime.     [provider]  ciprofloxacin (CIPRO) 500 MG tablet Take 1 tablet (500 mg total) by mouth 2 (two) times daily for 11 days. 10/16/20 10/27/20  Mikhail, Nita Sells, DO  gabapentin (NEURONTIN) 300 MG capsule Take 300 mg by mouth 2 (two) times daily.  09/23/20   [provider]  ibuprofen (ADVIL,MOTRIN) 200 MG tablet Take 400 mg by mouth every 6 (six) hours as needed for moderate pain (right knee pain).     [provider]  ondansetron (ZOFRAN ODT) 4 MG disintegrating tablet 4mg  ODT q4 hours prn nausea/vomit 10/16/20   Mikhail, 10/18/20, DO  sertraline (ZOLOFT) 100 MG tablet Take 150 mg by mouth at bedtime.     [provider]  traMADol (ULTRAM) 50 MG tablet Take 1 tablet (50 mg total) by mouth every 6 (six) hours  as needed for moderate pain. 10/16/20   10/18/20, DO    Allergies    Codeine, Strawberry extract, and Vibramycin [doxycycline calcium]  Review of Systems   Review of Systems  Constitutional: Positive for fatigue. Negative for fever.  Respiratory: Negative for shortness of breath.   Cardiovascular: Negative for chest pain.  Gastrointestinal: Negative for vomiting.  All other systems reviewed and are negative.   Physical Exam Updated Vital Signs BP 120/61   Pulse 77   Temp 98.5 F (36.9 C) (Oral)   Resp (!) 21   Ht 1.626 m (5\' 4" )   Wt 90.7 kg   SpO2 96%   BMI 34.33 kg/m   Physical Exam CONSTITUTIONAL: Well developed/well nourished HEAD: Normocephalic/atraumatic EYES: EOMI ENMT: Mucous membranes moist NECK: supple no meningeal signs CV: S1/S2 noted, no murmurs/rubs/gallops noted LUNGS: Lungs are clear to auscultation bilaterally, no apparent distress ABDOMEN: soft, nontender, no rebound or guarding, bowel sounds noted throughout abdomen GU nephrostomy tube noted on the right.  There is no bag connected to the tubing. NEURO: Pt is awake/alert/appropriate, moves all extremitiesx4.  No facial droop.   EXTREMITIES: full ROM SKIN: warm, color normal PSYCH: no abnormalities of mood noted, alert and oriented to situation  ED Results / Procedures / Treatments   Labs (all labs ordered are listed, but only abnormal results are displayed) Labs Reviewed - No data to display  EKG None  Radiology No results found.  Procedures Procedures Medications Ordered in ED Medications - No data to display  ED Course  I have reviewed the triage vital signs and the nursing notes.  Pertinent labs & imaging results that were available during my care of the patient were reviewed by me and considered in my medical decision making (see chart for details).    MDM Rules/Calculators/A&P                          2:33 AM Plan to consult urology. 3:09 AM Discussed with Dr.  Edsel Petrin with urology.  Since patient is overall stable with no acute issues, she should be monitored in the ER overnight and in the morning IR can do nephrostomy exchange. Patient updated on plan. She will remain NPO. Patient agreeable with plan. Patient reports she was diagnosed with COVID-19 2 months ago.  She recently tested positive in the hospital, but reports she was told this was a false positive She has no cough or fever at this time Final Clinical Impression(s) / ED Diagnoses Final diagnoses:  Nephrostomy tube displaced (HCC)  Malfunction of nephrostomy tube [facility])    Rx / DC Orders ED Discharge Orders    None       Berneice Heinrich, MD 10/17/20 [phone]

## 2020-10-17 NOTE — ED Notes (Signed)
Patient verbalizes understanding of discharge instructions. Opportunity for questioning and answers were provided. Armband removed by staff, pt discharged from ED via wheelchair to return to lobby to go home.   

## 2020-10-17 NOTE — ED Triage Notes (Signed)
Pt was discharged from hospital today from being sepsis. Pt had drained tubed placed in her right lower  back due to kidney stone that was blocked. Pt said she is not in pain. Pt said the drain was not working well today.

## 2020-10-17 NOTE — ED Notes (Signed)
Nurse attempted to call husband , Kalenna Millett to let him know wife went to IR. Unable to leave voicemail due to it being full.

## 2020-10-18 ENCOUNTER — Other Ambulatory Visit (HOSPITAL_COMMUNITY): Payer: Self-pay | Admitting: Interventional Radiology

## 2020-10-18 DIAGNOSIS — N200 Calculus of kidney: Secondary | ICD-10-CM

## 2020-10-19 NOTE — Progress Notes (Deleted)
Patient ID: Shirley Sullivan, female   DOB: 05/17/1961, 59 y.o.   MRN: [id number]    After hospitalization 11/7-11/14/2021 Recommendations for Outpatient Follow-up:  Patient will be discharged to home.  Patient will need to follow up with primary care provider within one week of discharge, repeat CBC, BMP, and magnesium.  Follow-up with the drain clinic in 2 weeks.  Continue daily flushes.  Follow-up with Dr. Laverle Patter, urology in 2 weeks.  Patient should continue medications as prescribed.  Patient should follow a soft diet.   Discharge Diagnoses:  Septic shock secondary to Klebsiella oxytocin and Enterobacter cloacae bacteremia/UTI Acute kidney injury Elevated troponin/NSTEMI Nausea and vomiting Acute metabolic encephalopathy Severe thrombocytopenia Normocytic anemia Diabetes mellitus, type II with neuropathy SLE Depression/anxiety Hepatosplenomegaly Obesity  Discharge Condition: Stable  Diet recommendation: Soft       Filed Weights   10/11/20 0253 10/12/20 0500 10/13/20 0500  Weight: 88.5 kg 89.7 kg 91.3 kg    History of present illness:  on 10/10/2020 by Ms. Vernona Rieger Gleason, PA 59 year old female with DM, asthma, nephrolithiasis, lupus (no flare in last 5 years/ not on home, depression/ anxiety, arthritis, and COVID-19 (positive on 08/15/2020) presenting to ER with complaints of not feeling well since Thursday. She thinks she passed a kidney stone on the right and since has continued to fell bad with suprapubic abdominal pain, bilateral flank pain, back pain, nausea and vomiting, decreased urinary output, dysuria, some confusion, generalized weakness, diarrhea, complaints of feeling hot, intermittent midsternum chest pain, and mild shortness of breath.  Hospital Course:  Septic shock secondary to Klebsiella oxytocin and Enterobacter cloacae bacteremia/UTI -Sepsis was present on admission -Shock has now resolved (patient did require pressors on admission) -Blood and urine  culturesfrom 11/7:Positive for Klebsiella and Enterobacter -Repeat blood cultures on 11/10 showed no growth to date -Currently on ciprofloxacin 500 mg twice daily.Will need 10 to 14 days of antibiotics -Interventional radiology consulted, status post nephrostomy drain placement.  Discussed with Dr. Fredia Sorrow, continue daily flushes once patient is home.  Patient will need outpatient follow-up with intern clinic in 2 weeks.  Husband tells me that he has been trained on how to do this -Patient will need to follow-up with urology, Dr. Laverle Patter  Acute kidney injury -Creatinine peaked to 3.49 -Suspect secondary to obstruction as well as sepsis -Creatinine down to0.9  Elevated troponin/NSTEMI -High-sensitivity troponin peaked at 898 -Suspect secondary to septic shock -Echocardiogram 10/10/2020 showed an EF of 55 to 60%, LV diastolic parameters normal. Mildly elevated pulmonary artery systolic pressure  Nausea and vomiting -Suspect secondary to infection as well as shock -Denies current nausea or vomiting -Was able to tolerate soft diet but continues to have nausea intermittently -Continue antiemetics as needed  Acute metabolic encephalopathy -Resolved, patient currently alert and oriented x3 -Suspect secondary to septic shock  Severe thrombocytopenia -Likely secondary to septic shock -DIC panel was unremarkable -Platelets improved to113 -Repeat CBC in 1 week  Normocytic anemia -Hemoglobin currently 103. Baseline approximately 12 -Suspect drop likely dilutional  Diabetes mellitus, type II with neuropathy -Controlled -Hemoglobin A1c 5.8 -Takes Metformin at home however was held during hospitalization (not seen on [facility]- however patient states she takes 500mg  BID), patient can continue this on discharge -Continue gabapentin  SLE -Currently not on any treatment  Depression/anxiety -Continue Xanax and Zoloft  Hepatosplenomegaly -Noted on CT imaging -?Secondary to  [facility] as patient denies alcohol use -Patient will need to follow-up with gastroenterology as an outpatient  Obesity -BMI 34.5 -Status post gastric  banding -Patient to follow up with her primary care physician to discuss lifestyle modifications  Hypokalemia/hypomagnesemia -Replaced, patient should repeat BMP and magnesium level in 1 week  Consultants [facility] Urology Interventional radiology  Procedures Echocardiogram IR guided nephrostomy tube placement

## 2020-10-20 LAB — ADAMTS13 ACTIVITY: Adamts 13 Activity: 25.9 % — CL (ref >66.8)

## 2020-10-20 LAB — ADAMTS13 ANTIBODY: ADAMTS13 Antibody: 6 Units/mL (ref <12)

## 2020-10-26 ENCOUNTER — Inpatient Hospital Stay: Payer: Self-pay | Admitting: Physician Assistant

## 2020-10-31 ENCOUNTER — Other Ambulatory Visit: Payer: Self-pay | Admitting: Urology

## 2020-11-01 ENCOUNTER — Other Ambulatory Visit: Payer: Self-pay

## 2020-11-01 ENCOUNTER — Encounter (HOSPITAL_COMMUNITY): Payer: Self-pay | Admitting: Urology

## 2020-11-01 NOTE — H&P (Signed)
Office Visit Report     10/25/2020   --------------------------------------------------------------------------------   Shirley Sullivan  MRN: [id number]  DOB: 23-May-1961, 59 year old Female  SSN: [id number]   PRIMARY CARE:  Barry Brunner, PA  REFERRING:    PROVIDER:  Heloise Purpura, M.D.  LOCATION:  [facility] [phone]     --------------------------------------------------------------------------------   CC/HPI: Right ureteral calculus   Shirley Sullivan is a 59 year old female who had previously seen for a ureteral stone multiple years ago. She was treated with ureteroscopic therapy. She recently presented to the hospital with sepsis and a 6 mm obstructing proximal right ureteral calculus. Due to the fact that she was hemodynamically unstable, she underwent right nephrostomy tube placement to relieve her obstruction. She subsequently improved clinically but was quite sick on presentation. She was discharged on ciprofloxacin based on her hospital culture. She is scheduled to finish this in 4 days. She presents today stating that she has been somewhat nauseated and has not been drinking a lot of fluid recently. Her urine output both from her nephrostomy tube and bladder have been decreased. She is scheduled to be seen in interventional radiology apparently tomorrow. She denies any recent fever. She is still somewhat weak but improving otherwise.     ALLERGIES: Codeine Derivatives Vibramycin CAPS    MEDICATIONS: Cipro  Metformin Hcl  Zyrtec  Xanax  Zoloft     GU PSH: Cysto Uretero Lithotripsy - 2015 Cystoscopy Insert Stent - 2015, 2015 Cystoscopy Ureteroscopy - 2015       PSH Notes: Cystoscopy With Ureteroscopy With Lithotripsy, Cystoscopy With Insertion Of Ureteral Stent Left, Cystoscopy With Insertion Of Ureteral Stent Left, Cystoscopy With Ureteroscopy, Appendectomy, Cesarean Section, Tonsillectomy, Laparosc Restrictive Proc Adjustable Gastric Band Placement    NON-GU PSH: Appendectomy - 2015 Cesarean Delivery Only - 2015 Lap, Place Gastr Adjust Band - 2015 Remove Tonsils - 2015     GU PMH: Renal calculus, Renal calculi - 2016 Ureteral obstruction secondary to calculous, Ureteral stone with hydronephrosis - 2015    NON-GU PMH: Encounter for general adult medical examination without abnormal findings, Encounter for preventive health examination - 2015 Anxiety, Anxiety - 2015 Personal history of other diseases of the digestive system, History of esophageal reflux - 2015 Personal history of other diseases of the musculoskeletal system and connective tissue, History of arthritis - 2015, History of systemic lupus erythematosus (SLE), - 2015 Personal history of other diseases of the respiratory system, History of asthma - 2015 Personal history of other mental and behavioral disorders, History of depression - 2015    FAMILY HISTORY: 1 Daughter - Runs in Family 2 sons - Runs in Family cardiac disorder - Runs In Family Diabetes - Runs In Family No significant past medical history - Runs In Family   SOCIAL HISTORY: Marital Status: Married Ethnicity: Not Hispanic Or Latino; Race: White Current Smoking Status: Patient has never smoked.   Tobacco Use Assessment Completed: Used Tobacco in last 30 days? Has never drank.  Drinks 1 caffeinated drink per day.     Notes: Married, Never a smoker, Alcohol use   REVIEW OF SYSTEMS:    GU Review Female:   Patient denies frequent urination, hard to postpone urination, burning /pain with urination, get up at night to urinate, leakage of urine, stream starts and stops, trouble starting your stream, have to strain to urinate, and currently pregnant.  Gastrointestinal (Upper):   Patient reports nausea. Patient denies vomiting.  Gastrointestinal (Lower):   Patient denies diarrhea and  constipation.  Constitutional:   Patient reports fatigue. Patient denies fever, night sweats, and weight loss.  Skin:   Patient  denies skin rash/ lesion and itching.  Eyes:   Patient denies blurred vision and double vision.  Ears/ Nose/ Throat:   Patient denies sore throat and sinus problems.  Hematologic/Lymphatic:   Patient denies swollen glands and easy bruising.  Cardiovascular:   Patient denies leg swelling and chest pains.  Respiratory:   Patient denies cough and shortness of breath.  Endocrine:   Patient denies excessive thirst.  Musculoskeletal:   Patient denies back pain and joint pain.  Neurological:   Patient denies headaches and dizziness.  Psychologic:   Patient denies depression and anxiety.   VITAL SIGNS:      10/25/2020 10:51 AM  Weight 195 lb / 88.45 kg  Height 64 in / 162.56 cm  BP 115/77 mmHg  Pulse 116 /min  Temperature 97.3 F / 36.2 C  BMI 33.5 kg/m   MULTI-SYSTEM PHYSICAL EXAMINATION:    Constitutional: Well-nourished. No physical deformities. Normally developed. Good grooming.  Respiratory: No labored breathing, no use of accessory muscles.   Cardiovascular: Normal temperature, normal extremity pulses, no swelling, no varicosities.  Gastrointestinal: She has an indwelling right nephrostomy tube. This is draining a small amount of grossly clear urine. Her tube was clamped and a specimen for culture was obtained.     Complexity of Data:  Records Review:   Previous Patient Records   PROCEDURES: None   ASSESSMENT:      ICD-10 Details  1 GU:   Ureteral calculus - N20.1    PLAN:           Orders Labs Urine Culture CATH, BMP          Schedule Return Visit/Planned Activity: Other See Visit Notes             Note: Will call to schedule surgery.          Document Letter(s):  Created for Patient: Clinical Summary         Notes:   1. 6 mm right proximal ureteral calculus: Her nephrostomy urine has been cultured today. She will complete her course of ciprofloxacin in the meantime. After reviewing options for definitive treatment of her stone, she is agreeable to proceed with right  ureteroscopic laser lithotripsy and ureteral stent placement. We would plan to remove her nephrostomy tube at that time under fluoroscopy. We reviewed the potential risks, complications, and expected recovery process of this procedure. She is well familiar with this considering she underwent this same procedure a few years ago. Her renal function will be checked today as well.   Cc: Barry Brunner, PA-C     * Signed by Heloise Purpura, M.D. on 10/25/20 at 9:01 PM (EST)*

## 2020-11-01 NOTE — Progress Notes (Addendum)
COVID Vaccine Completed: Yes Date COVID Vaccine completed: 03/2020 COVID vaccine manufacturer: [employer]   PCP - Sandre Kitty, PA-C Cardiologist - N/A  Chest x-ray - 10/09/20 in epic EKG - 10/10/20 in epic Stress Test - N/A ECHO - 10/10/20 in epic Cardiac Cath - N/A Pacemaker/ICD device last checked:N/A  Sleep Study - N/A CPAP - N/A  Fasting Blood Sugar - 110-130 Checks Blood Sugar ___5__ times a day Hgb A1C (5.8) 10/10/20 in epic  Blood Thinner Instructions: N/A Aspirin Instructions: N/A Last Dose: N/A  Activity level:  Able to exercise without symptoms     Anesthesia review: N/A  Patient denies shortness of breath, fever, cough and chest pain at PAT appointment   Patient verbalized understanding of instructions that were given to them at the PAT appointment. Patient was also instructed that they will need to review over the PAT instructions again at home before surgery.

## 2020-11-02 ENCOUNTER — Encounter (HOSPITAL_COMMUNITY)
Admission: RE | Disposition: A | Discharge status: Home / Self Care | Payer: Self-pay | Source: Home / Self Care | Attending: Urology

## 2020-11-02 ENCOUNTER — Ambulatory Visit (HOSPITAL_COMMUNITY): Payer: Self-pay | Admitting: Anesthesiology

## 2020-11-02 ENCOUNTER — Ambulatory Visit (HOSPITAL_COMMUNITY): Payer: Self-pay

## 2020-11-02 ENCOUNTER — Encounter (HOSPITAL_COMMUNITY): Payer: Self-pay | Admitting: Urology

## 2020-11-02 ENCOUNTER — Ambulatory Visit (HOSPITAL_COMMUNITY)
Admission: RE | Admit: 2020-11-02 | Discharge: 2020-11-02 | Disposition: A | Discharge status: Home / Self Care | Payer: Self-pay | Attending: Urology | Admitting: Urology

## 2020-11-02 DIAGNOSIS — E119 Type 2 diabetes mellitus without complications: Secondary | ICD-10-CM | POA: Insufficient documentation

## 2020-11-02 DIAGNOSIS — Z885 Allergy status to narcotic agent status: Secondary | ICD-10-CM | POA: Insufficient documentation

## 2020-11-02 DIAGNOSIS — M329 Systemic lupus erythematosus, unspecified: Secondary | ICD-10-CM | POA: Insufficient documentation

## 2020-11-02 DIAGNOSIS — K219 Gastro-esophageal reflux disease without esophagitis: Secondary | ICD-10-CM | POA: Insufficient documentation

## 2020-11-02 DIAGNOSIS — Z881 Allergy status to other antibiotic agents status: Secondary | ICD-10-CM | POA: Insufficient documentation

## 2020-11-02 DIAGNOSIS — Z7984 Long term (current) use of oral hypoglycemic drugs: Secondary | ICD-10-CM | POA: Insufficient documentation

## 2020-11-02 DIAGNOSIS — E669 Obesity, unspecified: Secondary | ICD-10-CM | POA: Insufficient documentation

## 2020-11-02 DIAGNOSIS — Z6830 Body mass index (BMI) 30.0-30.9, adult: Secondary | ICD-10-CM | POA: Insufficient documentation

## 2020-11-02 DIAGNOSIS — Z87442 Personal history of urinary calculi: Secondary | ICD-10-CM | POA: Insufficient documentation

## 2020-11-02 DIAGNOSIS — Z79899 Other long term (current) drug therapy: Secondary | ICD-10-CM | POA: Insufficient documentation

## 2020-11-02 DIAGNOSIS — Z8249 Family history of ischemic heart disease and other diseases of the circulatory system: Secondary | ICD-10-CM | POA: Insufficient documentation

## 2020-11-02 DIAGNOSIS — Z9884 Bariatric surgery status: Secondary | ICD-10-CM | POA: Insufficient documentation

## 2020-11-02 DIAGNOSIS — N132 Hydronephrosis with renal and ureteral calculous obstruction: Secondary | ICD-10-CM | POA: Insufficient documentation

## 2020-11-02 DIAGNOSIS — Z833 Family history of diabetes mellitus: Secondary | ICD-10-CM | POA: Insufficient documentation

## 2020-11-02 DIAGNOSIS — J45909 Unspecified asthma, uncomplicated: Secondary | ICD-10-CM | POA: Insufficient documentation

## 2020-11-02 HISTORY — PX: CYSTOSCOPY/URETEROSCOPY/HOLMIUM LASER/STENT PLACEMENT: SHX6546

## 2020-11-02 HISTORY — DX: Sepsis, unspecified organism: A41.9

## 2020-11-02 LAB — CBC
HCT: 37.3 % (ref 36.0–46.0)
Hemoglobin: 11.7 g/dL — ABNORMAL LOW (ref 12.0–15.0)
MCH: 26.8 pg (ref 26.0–34.0)
MCHC: 31.4 g/dL (ref 30.0–36.0)
MCV: 85.4 fL (ref 80.0–100.0)
Platelets: 205 10*3/uL (ref 150–400)
RBC: 4.37 MIL/uL (ref 3.87–5.11)
RDW: 14.4 % (ref 11.5–15.5)
WBC: 4.7 10*3/uL (ref 4.0–10.5)
nRBC: 0 % (ref 0.0–0.2)

## 2020-11-02 LAB — BASIC METABOLIC PANEL
Anion gap: 12 (ref 5–15)
BUN: 14 mg/dL (ref 8–23)
CO2: 25 mmol/L (ref 22–32)
Calcium: 9.3 mg/dL (ref 8.9–10.3)
Chloride: 97 mmol/L — ABNORMAL LOW (ref 98–111)
Creatinine, Ser: 0.96 mg/dL (ref 0.44–1.00)
GFR, Estimated: >60 mL/min (ref >60)
Glucose, Bld: 129 mg/dL — ABNORMAL HIGH (ref 70–99)
Potassium: 3.4 mmol/L — ABNORMAL LOW (ref 3.5–5.1)
Sodium: 134 mmol/L — ABNORMAL LOW (ref 135–145)

## 2020-11-02 SURGERY — CYSTOSCOPY/URETEROSCOPY/HOLMIUM LASER/STENT PLACEMENT
Anesthesia: General | Site: Bladder | Laterality: Right

## 2020-11-02 MED ORDER — 0.9 % SODIUM CHLORIDE (POUR BTL) OPTIME
TOPICAL | Status: DC | PRN
  Administered 2020-11-02: 1000 mL

## 2020-11-02 MED ORDER — EPHEDRINE 5 MG/ML INJ
INTRAVENOUS | Status: AC
  Filled 2020-11-02: qty 10

## 2020-11-02 MED ORDER — PROPOFOL 10 MG/ML IV BOLUS
INTRAVENOUS | Status: AC
  Filled 2020-11-02: qty 20

## 2020-11-02 MED ORDER — ORAL CARE MOUTH RINSE: 15.0000 mL | Freq: Once | OROMUCOSAL | Status: AC

## 2020-11-02 MED ORDER — PHENYLEPHRINE 40 MCG/ML (10ML) SYRINGE FOR IV PUSH (FOR BLOOD PRESSURE SUPPORT)
PREFILLED_SYRINGE | INTRAVENOUS | Status: AC
  Filled 2020-11-02: qty 10

## 2020-11-02 MED ORDER — OXYCODONE HCL 5 MG/5ML PO SOLN: 5.0000 mg | Freq: Once | ORAL | Status: DC | PRN

## 2020-11-02 MED ORDER — LACTATED RINGERS IV SOLN: INTRAVENOUS | Status: DC

## 2020-11-02 MED ORDER — DEXAMETHASONE SODIUM PHOSPHATE 10 MG/ML IJ SOLN
INTRAMUSCULAR | Status: DC | PRN
  Administered 2020-11-02: 10 mg via INTRAVENOUS

## 2020-11-02 MED ORDER — SODIUM CHLORIDE 0.9 % IV SOLN
2.0000 g | Freq: Once | INTRAVENOUS | Status: AC
  Administered 2020-11-02: 2 g via INTRAVENOUS
  Filled 2020-11-02: qty 20

## 2020-11-02 MED ORDER — LIDOCAINE HCL (PF) 2 % IJ SOLN
INTRAMUSCULAR | Status: AC
  Filled 2020-11-02: qty 5

## 2020-11-02 MED ORDER — FENTANYL CITRATE (PF) 100 MCG/2ML IJ SOLN
INTRAMUSCULAR | Status: AC
  Filled 2020-11-02: qty 2

## 2020-11-02 MED ORDER — CIPROFLOXACIN IN D5W 400 MG/200ML IV SOLN
400.0000 mg | Freq: Once | INTRAVENOUS | Status: AC
  Administered 2020-11-02: 400 mg via INTRAVENOUS
  Filled 2020-11-02: qty 200

## 2020-11-02 MED ORDER — ONDANSETRON HCL 4 MG/2ML IJ SOLN
INTRAMUSCULAR | Status: DC | PRN
  Administered 2020-11-02: 4 mg via INTRAVENOUS

## 2020-11-02 MED ORDER — MIDAZOLAM HCL 2 MG/2ML IJ SOLN
INTRAMUSCULAR | Status: DC | PRN
  Administered 2020-11-02: 2 mg via INTRAVENOUS

## 2020-11-02 MED ORDER — PROPOFOL 10 MG/ML IV BOLUS
INTRAVENOUS | Status: DC | PRN
  Administered 2020-11-02: 200 mg via INTRAVENOUS

## 2020-11-02 MED ORDER — FENTANYL CITRATE (PF) 100 MCG/2ML IJ SOLN: 25.0000 ug | INTRAMUSCULAR | Status: DC | PRN

## 2020-11-02 MED ORDER — EPHEDRINE SULFATE-NACL 50-0.9 MG/10ML-% IV SOSY
PREFILLED_SYRINGE | INTRAVENOUS | Status: DC | PRN
  Administered 2020-11-02: 10 mg via INTRAVENOUS

## 2020-11-02 MED ORDER — LIDOCAINE 2% (20 MG/ML) 5 ML SYRINGE
INTRAMUSCULAR | Status: DC | PRN
  Administered 2020-11-02: 60 mg via INTRAVENOUS

## 2020-11-02 MED ORDER — MIDAZOLAM HCL 2 MG/2ML IJ SOLN
INTRAMUSCULAR | Status: AC
  Filled 2020-11-02: qty 2

## 2020-11-02 MED ORDER — OXYCODONE HCL 5 MG PO TABS: 5.0000 mg | ORAL_TABLET | Freq: Once | ORAL | Status: DC | PRN

## 2020-11-02 MED ORDER — PHENYLEPHRINE 40 MCG/ML (10ML) SYRINGE FOR IV PUSH (FOR BLOOD PRESSURE SUPPORT)
PREFILLED_SYRINGE | INTRAVENOUS | Status: DC | PRN
  Administered 2020-11-02 (×2): 80 ug via INTRAVENOUS

## 2020-11-02 MED ORDER — SODIUM CHLORIDE 0.9 % IR SOLN
Status: DC | PRN
  Administered 2020-11-02: 3000 mL

## 2020-11-02 MED ORDER — CHLORHEXIDINE GLUCONATE 0.12 % MT SOLN
15.0000 mL | Freq: Once | OROMUCOSAL | Status: AC
  Administered 2020-11-02: 15 mL via OROMUCOSAL

## 2020-11-02 MED ORDER — IOHEXOL 300 MG/ML  SOLN
INTRAMUSCULAR | Status: DC | PRN
  Administered 2020-11-02: 3.5 mL

## 2020-11-02 MED ORDER — FENTANYL CITRATE (PF) 100 MCG/2ML IJ SOLN
INTRAMUSCULAR | Status: DC | PRN
  Administered 2020-11-02: 50 ug via INTRAVENOUS
  Administered 2020-11-02 (×2): 25 ug via INTRAVENOUS

## 2020-11-02 MED ORDER — DEXAMETHASONE SODIUM PHOSPHATE 10 MG/ML IJ SOLN
INTRAMUSCULAR | Status: AC
  Filled 2020-11-02: qty 1

## 2020-11-02 MED ORDER — ONDANSETRON HCL 4 MG/2ML IJ SOLN
INTRAMUSCULAR | Status: AC
  Filled 2020-11-02: qty 2

## 2020-11-02 MED ORDER — PROMETHAZINE HCL 25 MG/ML IJ SOLN: 6.2500 mg | INTRAMUSCULAR | Status: DC | PRN

## 2020-11-02 SURGICAL SUPPLY — 22 items
BAG URO CATCHER STRL LF (MISCELLANEOUS) ×3 IMPLANT
BASKET ZERO TIP NITINOL 2.4FR (BASKET) IMPLANT
BSKT STON RTRVL ZERO TP 2.4FR (BASKET)
CATH INTERMIT  6FR 70CM (CATHETERS) IMPLANT
CLOTH BEACON ORANGE TIMEOUT ST (SAFETY) ×3 IMPLANT
GLOVE BIOGEL M STRL SZ7.5 (GLOVE) ×3 IMPLANT
GOWN STRL REUS W/TWL LRG LVL3 (GOWN DISPOSABLE) ×3 IMPLANT
GUIDEWIRE STR DUAL SENSOR (WIRE) ×3 IMPLANT
GUIDEWIRE ZIPWRE .038 STRAIGHT (WIRE) IMPLANT
IV NS 1000ML (IV SOLUTION) ×3
IV NS 1000ML BAXH (IV SOLUTION) ×1 IMPLANT
KIT TURNOVER KIT A (KITS) IMPLANT
LASER FIB FLEXIVA PULSE ID 365 (Laser) IMPLANT
MANIFOLD NEPTUNE II (INSTRUMENTS) ×3 IMPLANT
PACK CYSTO (CUSTOM PROCEDURE TRAY) ×3 IMPLANT
SHEATH URETERAL 12FRX35CM (MISCELLANEOUS) IMPLANT
STENT URET 6FRX24 CONTOUR (STENTS) ×2 IMPLANT
TRACTIP FLEXIVA PULS ID 200XHI (Laser) IMPLANT
TRACTIP FLEXIVA PULSE ID 200 (Laser)
TUBING CONNECTING 10 (TUBING) ×2 IMPLANT
TUBING CONNECTING 10' (TUBING) ×1
TUBING UROLOGY SET (TUBING) ×3 IMPLANT

## 2020-11-02 NOTE — Anesthesia Postprocedure Evaluation (Signed)
Anesthesia Post Note  Patient: Shirley Sullivan  Procedure(s) Performed: CYSTOSCOPY/URETEROSCOPY/HOLMIUM LASER/STENT PLACEMENT/ REMOVAL OF RIGHT NEPHROSTOMY TUBE (Right Bladder)     Patient location during evaluation: PACU Anesthesia Type: General Level of consciousness: awake and alert Pain management: pain level controlled Vital Signs Assessment: post-procedure vital signs reviewed and stable Respiratory status: spontaneous breathing, nonlabored ventilation and respiratory function stable Cardiovascular status: blood pressure returned to baseline and stable Postop Assessment: no apparent nausea or vomiting Anesthetic complications: no   No complications documented.  Last Vitals:  Vitals:   11/02/20 1415 11/02/20 1430  BP: (!) 144/86 (!) 143/86  Pulse: (!) 105 (!) 105  Resp: 13 13  Temp: 37.2 C 37.2 C  SpO2: 96% 96%    Last Pain:  Vitals:   11/02/20 1430  TempSrc:   PainSc: 0-No pain                 Beryle Lathe

## 2020-11-02 NOTE — Anesthesia Procedure Notes (Signed)
Procedure Name: LMA Insertion Date/Time: 11/02/2020 12:55 PM Performed by: Tytan Sandate D, CRNA Pre-anesthesia Checklist: Patient identified, Emergency Drugs available, Suction available and Patient being monitored Patient Re-evaluated:Patient Re-evaluated prior to induction Oxygen Delivery Method: Circle system utilized Preoxygenation: Pre-oxygenation with 100% oxygen Induction Type: IV induction Ventilation: Mask ventilation without difficulty LMA: LMA inserted LMA Size: 4.0 Tube type: Oral Number of attempts: 1 Placement Confirmation: positive ETCO2 and breath sounds checked- equal and bilateral Tube secured with: Tape Dental Injury: Teeth and Oropharynx as per pre-operative assessment

## 2020-11-02 NOTE — Discharge Instructions (Signed)

## 2020-11-02 NOTE — Op Note (Signed)
Preoperative diagnosis: Right ureteral calculus  Postoperative diagnosis: Right ureteral calculus  Procedure:  1. Cystoscopy 2. Right ureteroscopy and stone removal 3. Ureteroscopic laser lithotripsy 4. Right ureteral stent placement (6 x 24 - no string) 5. Right retrograde pyelography with interpretation 6. Removal of right nephrostomy tube  Surgeon: Moody Bruins. M.D.  Anesthesia: General  Complications: None  Intraoperative findings: Right retrograde pyelography demonstrated a filling defect within the proximal right ureter consistent with the patient's known calculus without other abnormalities.  EBL: Minimal  Specimens: 1. Right ureteral calculus  Disposition of specimens: [facility] for stone analysis  Indication: Shirley Sullivan is a 59 y.o. year old patient with urolithiasis who recently developed sepsis with an obstructed right kidney due to a 6 mm proximal ureteral calculus.  She underwent urgent right nephrostomy placement and her infection was treated with appropriate antibiotic therapy.  After reviewing the management options for treatment, the patient elected to proceed with the above surgical procedure(s). We have discussed the potential benefits and risks of the procedure, side effects of the proposed treatment, the likelihood of the patient achieving the goals of the procedure, and any potential problems that might occur during the procedure or recuperation. Informed consent has been obtained.  Description of procedure:  The patient was taken to the operating room and general anesthesia was induced.  The patient was placed in the dorsal lithotomy position, prepped and draped in the usual sterile fashion, and preoperative antibiotics were administered. A preoperative time-out was performed.   Cystourethroscopy was performed.  The patient's urethra was examined and was normal. The bladder was then systematically examined in its entirety. There  was no evidence for any bladder tumors, stones, or other mucosal pathology.    Attention then turned to the right ureteral orifice and a ureteral catheter was used to intubate the ureteral orifice.  Omnipaque contrast was injected through the ureteral catheter and a retrograde pyelogram was performed with findings as dictated above.  A 0.38 sensor guidewire was then advanced up the right ureter into the renal pelvis under fluoroscopic guidance. The 6 Fr semirigid ureteroscope was then advanced into the ureter next to the guidewire and the calculus was identified in the proximal ureter.   The stone was then fragmented with the 365 micron holmium laser fiber on a setting of 0.6 J and frequency of 6 Hz.   All stones were then removed from the ureter with an N gage basket.  Reinspection of the ureter revealed no remaining visible stones or fragments.   The wire was then backloaded through the cystoscope and a ureteral stent was advance over the wire using Seldinger technique.  The stent was positioned appropriately under fluoroscopic and cystoscopic guidance.  The wire was then removed with an adequate stent curl noted in the renal pelvis as well as in the bladder.  Under fluoroscopy, the right nephrostomy tube was then removed ensuring the stent remained in adequate position.  The bladder was then emptied and the procedure ended.  The patient appeared to tolerate the procedure well and without complications.  The patient was able to be awakened and transferred to the recovery unit in satisfactory condition.

## 2020-11-02 NOTE — Interval H&P Note (Signed)
History and Physical Interval Note:  11/02/2020 11:27 AM  Shirley Sullivan  has presented today for surgery, with the diagnosis of RIGHT URETERAL CALCULUS.  The various methods of treatment have been discussed with the patient and family. After consideration of risks, benefits and other options for treatment, the patient has consented to  Procedure(s) with comments: CYSTOSCOPY/URETEROSCOPY/HOLMIUM LASER/STENT PLACEMENT/ REMOVAL OF RIGHT NEPHROSTOMY TUBE (Right) - patient will not need to be covid tested, tested positive on 11/14/20 has proof as a surgical intervention.  The patient's history has been reviewed, patient examined, no change in status, stable for surgery.  I have reviewed the patient's chart and labs.  Questions were answered to the patient's satisfaction.     [employer]

## 2020-11-02 NOTE — Anesthesia Preprocedure Evaluation (Addendum)
Anesthesia Evaluation  Patient identified by MRN, date of birth, ID band Patient awake    Reviewed: Allergy & Precautions, NPO status , Patient's Chart, lab work & pertinent test results  History of Anesthesia Complications Negative for: history of anesthetic complications  Airway Mallampati: II  TM Distance: >3 FB Neck ROM: Full    Dental  (+) Dental Advisory Given   Pulmonary asthma ,    Pulmonary exam normal        Cardiovascular negative cardio ROS Normal cardiovascular exam     Neuro/Psych PSYCHIATRIC DISORDERS Anxiety Depression negative neurological ROS     GI/Hepatic negative GI ROS, Neg liver ROS,   Endo/Other  diabetes, Type 2, Oral Hypoglycemic Agents Obesity   Renal/GU Renal disease     Musculoskeletal  (+) Arthritis ,   Abdominal   Peds  Hematology negative hematology ROS (+)   Anesthesia Other Findings Lupus Covid+ 10/09/20 (+ as early as 08/15/20)  Reproductive/Obstetrics  s/p hysterectomy                             Anesthesia Physical Anesthesia Plan  ASA: II  Anesthesia Plan: General   Post-op Pain Management:    Induction: Intravenous  PONV Risk Score and Plan: 4 or greater and Treatment may vary due to age or medical condition, Ondansetron, Midazolam and Dexamethasone  Airway Management Planned: LMA  Additional Equipment: None  Intra-op Plan:   Post-operative Plan: Extubation in OR  Informed Consent: I have reviewed the patients History and Physical, chart, labs and discussed the procedure including the risks, benefits and alternatives for the proposed anesthesia with the patient or authorized representative who has indicated his/her understanding and acceptance.     Dental advisory given  Plan Discussed with: CRNA and Anesthesiologist  Anesthesia Plan Comments:        Anesthesia Quick Evaluation

## 2020-11-02 NOTE — Transfer of Care (Signed)
Immediate Anesthesia Transfer of Care Note  Patient: EDYTHE RICHES  Procedure(s) Performed: CYSTOSCOPY/URETEROSCOPY/HOLMIUM LASER/STENT PLACEMENT/ REMOVAL OF RIGHT NEPHROSTOMY TUBE (Right Bladder)  Patient Location: PACU  Anesthesia Type:General  Level of Consciousness: awake, alert  and oriented  Airway & Oxygen Therapy: Patient Spontanous Breathing and Patient connected to face mask oxygen  Post-op Assessment: Report given to RN and Post -op Vital signs reviewed and stable  Post vital signs: Reviewed and stable  Last Vitals:  Vitals Value Taken Time  BP 178/96 11/02/20 1345  Temp    Pulse 123 11/02/20 1348  Resp 16 11/02/20 1348  SpO2 99 % 11/02/20 1348  Vitals shown include unvalidated device data.  Last Pain:  Vitals:   11/02/20 1345  TempSrc:   PainSc: (P) 0-No pain      Patients Stated Pain Goal: 3 (11/01/20 1517)  Complications: No complications documented.

## 2020-11-03 ENCOUNTER — Encounter (HOSPITAL_COMMUNITY): Payer: Self-pay | Admitting: Urology

## 2020-11-17 ENCOUNTER — Ambulatory Visit (HOSPITAL_COMMUNITY): Admission: RE | Admit: 2020-11-17 | Payer: MEDICAID | Source: Ambulatory Visit

## 2021-01-13 ENCOUNTER — Encounter: Payer: Self-pay | Admitting: General Practice

## 2023-12-26 ENCOUNTER — Encounter: Payer: Self-pay | Admitting: Neurology

## 2024-01-30 NOTE — Progress Notes (Signed)
 Initial neurology clinic note  Reason for Evaluation: Consultation requested by Hinojosa-Clapp, Marcela* for an opinion regarding pain in feet and ambulation abnormalities. My final recommendations will be communicated back to the requesting physician by way of shared medical record or letter to requesting physician via [place] mail.  HPI: This is Ms. Shirley Sullivan, a 63 y.o. right-handed female with a medical history of HTN, DM, OA, migraine, depression, SLE, asthma, nephrolithiasis, CAD c/b MI, cirrhosis of liver c/b portal HTN, vit D deficiency who presents to neurology clinic with the chief complaint of pain in feet and ambulation abnormalities. The patient is accompanied by her husband.  Patient was diagnosed with diabetes with 5 years ago. About 4 years ago, she started having pain in pinkie toe of right foot. She was started on gabapentin by PCP. Now both feet hurt, but right more than left. She thinks the right foot looks more swollen than the left. For the last couple of years she has had difficulty walking. She went to PT (last 11/2023) and was told her left leg was shorter than her right and her hips were misaligned. She was much better in terms of walking after this, but her feet still hurt. She can still be off balance at times.   Her last fall was in 09/2023 when she fractured her tailbone. She has healed well. Previous fall was a couple years before that.  She continues to take gabapentin 400 mg TID. She is generally happy with this, but occasionally has severe pain at night.  She is on xanax and sertraline for anxiety and depression.  Patient has lupus, so she does not like heat. She does not sweat well, so she has difficulty with this. She denies excessive mucosal dryness, gastroparetic early satiety, postprandial abdominal bloating, constipation, bowel or bladder dyscontrol, or syncope/presyncope/orthostatic intolerance  She does not report any constitutional symptoms like fever,  night sweats, anorexia or unintentional weight loss.  EtOH use: None  Restrictive diet? No Family history of neuropathy/myopathy/neurologic disease? No   MEDICATIONS:  Outpatient Encounter Medications as of 02/07/2024  Medication Sig Note   ALPRAZolam (XANAX) 0.5 MG tablet Take 0.5 tablets (0.25 mg total) by mouth 2 (two) times daily as needed for anxiety.    cetirizine (ZYRTEC) 10 MG tablet Take 10 mg by mouth at bedtime.     dicyclomine (BENTYL) 10 MG capsule Take 10 mg by mouth 4 (four) times daily as needed (abdominal spasm.).  10/31/2020: On hand   gabapentin (NEURONTIN) 300 MG capsule Take 400 mg by mouth 3 (three) times daily.    losartan (COZAAR) 25 MG tablet Take 25 mg by mouth daily.    metFORMIN (GLUCOPHAGE) 1000 MG tablet Take 1,000 mg by mouth 2 (two) times daily with a meal.    mupirocin ointment (BACTROBAN) 2 % Apply 1 application topically 2 (two) times daily as needed (wound care (hidradenitis suppurativa)).     ondansetron (ZOFRAN ODT) 4 MG disintegrating tablet 4mg  ODT q4 hours prn nausea/vomit (Patient taking differently: Take 4 mg by mouth every 4 (four) hours as needed for nausea or vomiting.) 10/31/2020: On hand   sertraline (ZOLOFT) 100 MG tablet Take 100 mg by mouth at bedtime.    ciprofloxacin (CIPRO) 500 MG tablet Take 500 mg by mouth 2 (two) times daily. (Patient not taking: Reported on 02/07/2024)    rosuvastatin (CRESTOR) 20 MG tablet Take 20 mg by mouth at bedtime. (Patient not taking: Reported on 02/07/2024)    traMADol (ULTRAM) 50 MG  tablet Take 1 tablet (50 mg total) by mouth every 6 (six) hours as needed for moderate pain. (Patient not taking: Reported on 10/31/2020)    No facility-administered encounter medications on file as of 02/07/2024.    PAST MEDICAL HISTORY: Past Medical History:  Diagnosis Date   Allergy    Anxiety    Arthritis    Asthma    perfumes triggers attacks. Has inhalers for rescue   COVID-19 10/09/2020   and 08/15/20   Depression     Diabetes mellitus without complication (HCC)    History of kidney stones 1998, 2015   Lupus    Septic shock (HCC)    Klebsiella oxytocin and Enterobacter cloacae bacteremia/UTI    PAST SURGICAL HISTORY: Past Surgical History:  Procedure Laterality Date   ABDOMINAL HYSTERECTOMY  2004   APPENDECTOMY  1992   CYSTOSCOPY WITH RETROGRADE PYELOGRAM, URETEROSCOPY AND STENT PLACEMENT Left 08/11/2014   Procedure: CYSTOSCOPY WITH RETROGRADE PYELOGRAM, URETEROSCOPY AND STENT PLACEMENT,  DIGITAL FLEXIBLE URETEROSCOPE;  Surgeon: Heloise Purpura, MD;  Location: [facility];  Service: Urology;  Laterality: Left;  request digital flexible ureteroscope   CYSTOSCOPY WITH RETROGRADE PYELOGRAM, URETEROSCOPY AND STENT PLACEMENT Left 08/23/2014   Procedure: CYSTOSCOPY WITH RETROGRADE PYELOGRAM, URETEROSCOPY AND STENT EXCHANGE;  Surgeon: Heloise Purpura, MD;  Location: [facility];  Service: Urology;  Laterality: Left;   CYSTOSCOPY/URETEROSCOPY/HOLMIUM LASER/STENT PLACEMENT Right 11/02/2020   Procedure: CYSTOSCOPY/URETEROSCOPY/HOLMIUM LASER/STENT PLACEMENT/ REMOVAL OF RIGHT NEPHROSTOMY TUBE;  Surgeon: Heloise Purpura, MD;  Location: [facility];  Service: Urology;  Laterality: Right;  patient will not need to be covid tested, tested positive on 11/14/20 has proof   HOLMIUM LASER APPLICATION Left 08/23/2014   Procedure: HOLMIUM LASER APPLICATION;  Surgeon: Heloise Purpura, MD;  Location: [facility];  Service: Urology;  Laterality: Left;   IR NEPHROSTOMY EXCHANGE RIGHT  10/17/2020   IR NEPHROSTOMY PLACEMENT RIGHT  10/10/2020   ROTATOR CUFF REPAIR Right 2007    ALLERGIES: Allergies  Allergen Reactions   Codeine Itching   Strawberry Extract Hives, Itching and Other (See Comments)    Can't breathe   Vibramycin [Doxycycline Calcium] Nausea And Vomiting    FAMILY HISTORY: Family History  Problem Relation Age of Onset   Hyperlipidemia Mother     SOCIAL HISTORY: Social History   Tobacco Use   Smoking status: Never   Smokeless tobacco:  Never  Vaping Use   Vaping status: Never Used  Substance Use Topics   Alcohol use: No    Alcohol/week: 0.0 standard drinks of alcohol   Drug use: No   Social History   Social History Narrative   Are you right handed or left handed? Right   Are you currently employed ? no   What is your current occupation? no   Do you live at home alone?   Who lives with you? family   What type of home do you live in: 1 story or 2 story? one   Caffiene 16 oz a daily      OBJECTIVE: PHYSICAL EXAM: BP 128/70   Pulse 100   Ht 5\' 4"  (1.626 m)   Wt 194 lb (88 kg)   SpO2 97%   BMI 33.30 kg/m   General: General appearance: Awake and alert. No distress. Cooperative with exam.  Skin: No obvious rash or jaundice. HEENT: Atraumatic. Anicteric. Lungs: Non-labored breathing on room air   Neurological: Mental Status: Alert. Speech fluent. No pseudobulbar affect Cranial Nerves: CNII: No RAPD. Visual fields grossly intact. CNIII, IV, VI: PERRL. No nystagmus.  EOMI. CN V: Facial sensation intact bilaterally to fine touch. CN VII: Facial muscles symmetric and strong. No ptosis at rest. CN VIII: Hearing grossly intact bilaterally. CN IX: No hypophonia. CN X: Palate elevates symmetrically. CN XI: Full strength shoulder shrug bilaterally. CN XII: Tongue protrusion full and midline. No atrophy or fasciculations. No significant dysarthria Motor: Tone is normal. Strength 5/5 in bilateral upper and lower extremities. Reflexes:  Right Left   Bicep 2+ 2+   Tricep 2+ 2+   BrRad 2+ 2+   Knee 2+ 2+   Ankle 1+ 1+    Pathological Reflexes: Babinski: flexor response bilaterally Hoffman: absent bilaterally Sensation: Pinprick: Intact in all extremities Vibration: intact in all extremities Proprioception: Intact in bilateral great toes Coordination: Intact finger-to- nose-finger bilaterally. Romberg negative. Gait: Able to rise from chair with arms crossed unassisted. Normal, narrow-based gait.  Difficulty with tandem walk. Able to walk on toes and heels.  Lab and Test Review: Internal labs: HbA1c (10/10/20): 5.8 (most recent is 6 per patient about 01/2024)  Imaging/Procedures: MRI lumbar spine wo contrast (09/10/23 - external): FINDINGS:    Alignment: Mild anterolisthesis of L4 on L5.   Vertebrae: Vertebral body heights are maintained. No marrow signal abnormalities to suggest neoplasm.   Conus medullaris: In normal position. Normal signal and contour.   Degenerative changes:   T12-L1: Facet arthropathy without substantial canal or foraminal stenosis.  L1-L2: Facet arthropathy without substantial canal or foraminal stenosis.  L2-L3: Facet arthropathy and ligamentum flavum hypertrophy without substantial canal stenosis. Mild bilateral foraminal stenosis.  L3-L4: Broad-based disc bulge, facet arthropathy, and ligamentum flavum hypertrophy contributes to mild narrowing of the subarticular recesses. No substantial central zone stenosis. Mild left greater than right foraminal stenosis.  L4-L5: Broad-based disc bulge, facet arthropathy, and ligamentum flavum hypertrophy contributes to mild narrowing of the subarticular recesses. No substantial central zone stenosis. Mild left greater than right foraminal stenosis.  L5-S1: Broad-based disc bulge, facet arthropathy, and ligamentum flavum hypertrophy without substantial canal stenosis. Mild left greater than right foraminal stenosis.   Upper sacrum: No focal lesion identified.   Additional findings: Fatty atrophy of the visualized paraspinal and gluteal musculature. Small T2 hyperintense renal cyst.   IMPRESSION:  Mild multilevel lumbar spondylosis without high grade canal or foraminal stenosis within the lumbar spine. Level by level details as above.   ASSESSMENT: Shirley Sullivan is a 63 y.o. female who presents for evaluation of pain in both feet and imbalance when walking. She has a relevant medical history of HTN, DM, OA, migraine,  depression, SLE, asthma, nephrolithiasis, CAD c/b MI, cirrhosis of liver c/b portal HTN, vit D deficiency. Her neurological examination is pertinent for mild gait imbalance. Available diagnostic data is significant for HbA1c 6.0 in 01/2024 per patient. The etiology of symptoms is currently unclear. 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 I will work up as below.  PLAN: -Blood work: B1, B12, IFE -EMG: RLE and maybe LLE vs RUE -Alpha lipoic acid 600 mg once or twice daily -Lidocaine cream PRN -Gabapentin 400 mg TID, can take an extra 400 mg at bedtime if needed  -Return to clinic to be determined  The impression above as well as the plan as outlined below were extensively discussed with the patient (in the company of husband) who voiced understanding. All questions were answered to their satisfaction.  The patient was counseled on  pertinent fall precautions per the printed material provided today, and as noted under the "Patient Instructions" section below.  When available, results of the above investigations and possible further recommendations will be communicated to the patient via telephone/MyChart. Patient to call office if not contacted after expected testing turnaround time.   Total time spent reviewing records, interview, history/exam, documentation, and coordination of care on day of encounter:  55 min   Thank you for allowing me to participate in patient's care.  If I can answer any additional questions, I would be pleased to do so.  Jacquelyne Balint, MD   CC: Sherren Mocha, MD [address] [place] [address]  CC: Referring provider: Sherren Mocha, MD [address] [place],  [place] [address]

## 2024-02-07 ENCOUNTER — Encounter: Payer: Self-pay | Admitting: Neurology

## 2024-02-07 ENCOUNTER — Other Ambulatory Visit

## 2024-02-07 ENCOUNTER — Ambulatory Visit (INDEPENDENT_AMBULATORY_CARE_PROVIDER_SITE_OTHER): Payer: Self-pay | Admitting: Neurology

## 2024-02-07 VITALS — BP 128/70 | HR 100 | Ht 64.0 in | Wt 194.0 lb

## 2024-02-07 DIAGNOSIS — R269 Unspecified abnormalities of gait and mobility: Secondary | ICD-10-CM | POA: Diagnosis not present

## 2024-02-07 DIAGNOSIS — R2689 Other abnormalities of gait and mobility: Secondary | ICD-10-CM | POA: Diagnosis not present

## 2024-02-07 DIAGNOSIS — M79671 Pain in right foot: Secondary | ICD-10-CM | POA: Diagnosis not present

## 2024-02-07 DIAGNOSIS — M79672 Pain in left foot: Secondary | ICD-10-CM | POA: Diagnosis not present

## 2024-02-07 NOTE — Patient Instructions (Addendum)
 I saw you today for pain in your feet and abnormal walking. I want to investigate your symptoms further with the following: -Lab work today -Nerve testing called EMG (see below)  Gabapentin 400 mg three times per day, can take an extra 400 mg at bedtime if needed.  Alpha lipoic acid 600mg  daily has some research data suggesting it helps with nerve health. No major side effects other than <1% of people report upset stomach. This can be taken twice per day (1200mg  daily) if no relief obtained. You can buy this over the counter or online.  You can also try Lidocaine cream as needed. Apply wear you have pain, tingling, or burning. Wear gloves to prevent your hands being numb. This can be bought over the counter at any drug store or online.  The physicians and staff at [facility] are committed to providing excellent care. You may receive a survey requesting feedback about your experience at our office. We strive to receive "very good" responses to the survey questions. If you feel that your experience would prevent you from giving the office a "very good " response, please contact our office to try to remedy the situation. We may be reached at [phone]. Thank you for taking the time out of your busy day to complete the survey.  Jacquelyne Balint, MD [facility]  ELECTROMYOGRAM AND NERVE CONDUCTION STUDIES (EMG/NCS) INSTRUCTIONS  How to Prepare The neurologist conducting the EMG will need to know if you have certain medical conditions. Tell the neurologist and other EMG lab personnel if you: Have a pacemaker or any other electrical medical device Take blood-thinning medications Have hemophilia, a blood-clotting disorder that causes prolonged bleeding Bathing Take a shower or bath shortly before your exam in order to remove oils from your skin. Don't apply lotions or creams before the exam.  What to Expect You'll likely be asked to change into a hospital gown for the procedure and lie  down on an examination table. The following explanations can help you understand what will happen during the exam.  Electrodes. The neurologist or a technician places surface electrodes at various locations on your skin depending on where you're experiencing symptoms. Or the neurologist may insert needle electrodes at different sites depending on your symptoms.  Sensations. The electrodes will at times transmit a tiny electrical current that you may feel as a twinge or spasm. The needle electrode may cause discomfort or pain that usually ends shortly after the needle is removed. If you are concerned about discomfort or pain, you may want to talk to the neurologist about taking a short break during the exam.  Instructions. During the needle EMG, the neurologist will assess whether there is any spontaneous electrical activity when the muscle is at rest - activity that isn't present in healthy muscle tissue - and the degree of activity when you slightly contract the muscle.  He or she will give you instructions on resting and contracting a muscle at appropriate times. Depending on what muscles and nerves the neurologist is examining, he or she may ask you to change positions during the exam.  After your EMG You may experience some temporary, minor bruising where the needle electrode was inserted into your muscle. This bruising should fade within several days. If it persists, contact your primary care doctor.   Preventing Falls at [facility] are common, often dreaded events in the lives of older people. Aside from the obvious injuries and even death that may result, fall can  cause wide-ranging consequences including loss of independence, mental decline, decreased activity and mobility. Younger people are also at risk of falling, especially those with chronic illnesses and fatigue.  Ways to reduce risk for falling Examine diet and medications. Warm foods and alcohol dilate blood vessels, which can lead to  dizziness when standing. Sleep aids, antidepressants and pain medications can also increase the likelihood of a fall.  Get a vision exam. Poor vision, cataracts and glaucoma increase the chances of falling.  Check foot gear. Shoes should fit snugly and have a sturdy, nonskid sole and a broad, low heel  Participate in a physician-approved exercise program to build and maintain muscle strength and improve balance and coordination. Programs that use ankle weights or stretch bands are excellent for muscle-strengthening. Water aerobics programs and low-impact Tai Chi programs have also been shown to improve balance and coordination.  Increase vitamin D intake. Vitamin D improves muscle strength and increases the amount of calcium the body is able to absorb and deposit in bones.  How to prevent falls from common hazards Floors - Remove all loose wires, cords, and throw rugs. Minimize clutter. Make sure rugs are anchored and smooth. Keep furniture in its usual place.  Chairs -- Use chairs with straight backs, armrests and firm seats. Add firm cushions to existing pieces to add height.  Bathroom - Install grab bars and non-skid tape in the tub or shower. Use a bathtub transfer bench or a shower chair with a back support Use an elevated toilet seat and/or safety rails to assist standing from a low surface. Do not use towel racks or bathroom tissue holders to help you stand.  Lighting - Make sure halls, stairways, and entrances are well-lit. Install a night light in your bathroom or hallway. Make sure there is a light switch at the top and bottom of the staircase. Turn lights on if you get up in the middle of the night. Make sure lamps or light switches are within reach of the bed if you have to get up during the night.  Kitchen - Install non-skid rubber mats near the sink and stove. Clean spills immediately. Store frequently used utensils, pots, pans between waist and eye level. This helps prevent reaching  and bending. Sit when getting things out of lower cupboards.  Living room/ Bedrooms - Place furniture with wide spaces in between, giving enough room to move around. Establish a route through the living room that gives you something to hold onto as you walk.  Stairs - Make sure treads, rails, and rugs are secure. Install a rail on both sides of the stairs. If stairs are a threat, it might be helpful to arrange most of your activities on the lower level to reduce the number of times you must climb the stairs.  Entrances and doorways - Install metal handles on the walls adjacent to the doorknobs of all doors to make it more secure as you travel through the doorway.  Tips for maintaining balance Keep at least one hand free at all times. Try using a backpack or fanny pack to hold things rather than carrying them in your hands. Never carry objects in both hands when walking as this interferes with keeping your balance.  Attempt to swing both arms from front to back while walking. This might require a conscious effort if Parkinson's disease has diminished your movement. It will, however, help you to maintain balance and posture, and reduce fatigue.  Consciously lift your feet off of the  ground when walking. Shuffling and dragging of the feet is a common culprit in losing your balance.  When trying to navigate turns, use a "U" technique of facing forward and making a wide turn, rather than pivoting sharply.  Try to stand with your feet shoulder-length apart. When your feet are close together for any length of time, you increase your risk of losing your balance and falling.  Do one thing at a time. Don't try to walk and accomplish another task, such as reading or looking around. The decrease in your automatic reflexes complicates motor function, so the less distraction, the better.  Do not wear rubber or gripping soled shoes, they might "catch" on the floor and cause tripping.  Move slowly when  changing positions. Use deliberate, concentrated movements and, if needed, use a grab bar or walking aid. Count 15 seconds between each movement. For example, when rising from a seated position, wait 15 seconds after standing to begin walking.  If balance is a continuous problem, you might want to consider a walking aid such as a cane, walking stick, or walker. Once you've mastered walking with help, you might be ready to try it on your own again.

## 2024-02-12 ENCOUNTER — Encounter: Payer: Self-pay | Admitting: Neurology

## 2024-02-12 LAB — IMMUNOFIXATION ELECTROPHORESIS
IgG (Immunoglobin G), Serum: 1493 mg/dL (ref 600–1540)
IgM, Serum: 184 mg/dL (ref 50–300)
Immunoglobulin A: 121 mg/dL (ref 70–320)

## 2024-02-12 LAB — VITAMIN B12: Vitamin B-12: 220 pg/mL (ref 200–1100)

## 2024-02-12 LAB — VITAMIN B1: Vitamin B1 (Thiamine): 6 nmol/L — ABNORMAL LOW (ref 8–30)

## 2024-03-09 ENCOUNTER — Ambulatory Visit (INDEPENDENT_AMBULATORY_CARE_PROVIDER_SITE_OTHER): Admitting: Neurology

## 2024-03-09 ENCOUNTER — Telehealth: Payer: Self-pay | Admitting: Neurology

## 2024-03-09 DIAGNOSIS — M79672 Pain in left foot: Secondary | ICD-10-CM

## 2024-03-09 DIAGNOSIS — M79671 Pain in right foot: Secondary | ICD-10-CM | POA: Diagnosis not present

## 2024-03-09 DIAGNOSIS — R2689 Other abnormalities of gait and mobility: Secondary | ICD-10-CM

## 2024-03-09 DIAGNOSIS — R269 Unspecified abnormalities of gait and mobility: Secondary | ICD-10-CM

## 2024-03-09 DIAGNOSIS — G629 Polyneuropathy, unspecified: Secondary | ICD-10-CM | POA: Diagnosis not present

## 2024-03-09 NOTE — Telephone Encounter (Signed)
 Discussed the results of patient's EMG after the procedure today. 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  Her known risk factors for neuropathy are DM, B1 deficiency, and borderline low B12. She is taking all the recommended supplements (B1, B12, alpha lipoic acid). She also has a history of Lupus which could be contributing.  She is taking gabapentin and taking an additional 400 mg at bedtime if needed. She also has lidocaine cream. She has good and bad days but is overall similar to prior.  She will follow up with me in 08/2024.  All questions were answered.  Shirley Balint, MD [facility]

## 2024-03-09 NOTE — Procedures (Signed)
 [facility]  [address] [place], [address]  [place], [place] [address] Tel: [phone] Fax: [phone] Test Date:  03/09/2024  Patient: Shirley Sullivan DOB: 27-Jun-1961 Physician: Jacquelyne Balint, MD  Sex: Female Height: 5\' 4"  Ref Phys: Jacquelyne Balint, MD  ID#: [id number]   Technician:    History: This is a 63 year old female with pain in feet and imbalance.  NCV & EMG Findings: Extensive electrodiagnostic evaluation of the right lower limb with additional nerve conduction studies of the right upper limb and left lower limb shows: Bilateral sural and superficial peroneal/fibular sensory responses are absent. Right median and ulnar sensory responses are within normal limits. Bilateral tibial (AH) motor responses show reduced amplitude (L0.81, R1.21 mV). Right peroneal/fibular (EDB and TA) motor responses are within normal limits. Right H reflex is absent. Chronic motor axon loss changes without accompanying active denervation changes are seen in the right tibialis anterior and medial head of gastrocnemius muscles.  Impression: This is an abnormal study. The findings are most consistent with the following: Evidence of a length dependent, large fiber sensorimotor neuropathy, axon loss in type, mild to moderate in degree electrically. No definitive electrodiagnostic evidence of a right lumbosacral (L3-S1) motor radiculopathy.    ___________________________ Jacquelyne Balint, MD    Nerve Conduction Studies Motor Nerve Results    Latency Amplitude F-Lat Segment Distance CV Comment  Site (ms) Norm (mV) Norm (ms)  (cm) (m/s) Norm   Right Fibular (EDB) Motor  Ankle 4.4  < 6.0 2.6  > 2.5        Bel fib head 11.8 - 1.93 -  Bel fib head-Ankle 30 41  > 40   Pop fossa 13.8 - 1.85 -  Pop fossa-Bel fib head 8 40 -   Right Fibular (TA) Motor  Fib head 2.2  < 4.5 3.4  > 3.0        Pop fossa 4.2  < 6.7 3.4 -  Pop fossa-Fib head 8 40  > 40   Left Tibial (AH) Motor  Ankle 5.8  < 6.0 *0.81  > 4.0         Right Tibial (AH) Motor  Ankle 4.9  < 6.0 *1.21  > 4.0        Knee 15.0 - 1.20 -  Knee-Ankle 41 41  > 40    Sensory Sites    Neg Peak Lat Amplitude (O-P) Segment Distance Velocity Comment  Site (ms) Norm (V) Norm  (cm) (ms)   Right Median Sensory  Wrist-Dig II 3.1  < 3.8 14  > 10 Wrist-Dig II 13    Left Superficial Fibular Sensory  14 cm-Ankle *NR  < 4.6 *NR  > 3 14 cm-Ankle 14    Right Superficial Fibular Sensory  14 cm-Ankle *NR  < 4.6 *NR  > 3 14 cm-Ankle 14    Left Sural Sensory  Calf-Lat mall *NR  < 4.6 *NR  > 3 Calf-Lat mall 14    Right Sural Sensory  Calf-Lat mall *NR  < 4.6 *NR  > 3 Calf-Lat mall 14    Right Ulnar Sensory  Wrist-Dig V 3.1  < 3.2 9  > 5 Wrist-Dig V 11     H-Reflex Results    M-Lat H Lat H Neg Amp H-M Lat  Site (ms) (ms) Norm (mV) (ms)  Right Tibial H-Reflex  Pop fossa 4.8 NR  < 35.0 NR NR   Electromyography   Side Muscle Ins.Act Fibs Fasc Recrt Amp Dur Poly Activation Comment  Right Tib ant Nml Nml Nml *2- *1+ *1+ Nml Nml N/A  Right Gastroc MH Nml Nml Nml *1- *1+ *1+ Nml Nml N/A  Right Rectus fem Nml Nml Nml Nml Nml Nml Nml Nml N/A  Right Biceps fem SH Nml Nml Nml Nml Nml Nml Nml Nml N/A  Right Gluteus med Nml Nml Nml Nml Nml Nml Nml Nml N/A  Right L5 PSP Nml Nml Nml Nml Nml Nml Nml Nml N/A      Waveforms:  Motor           Sensory               H-Reflex

## 2024-04-13 ENCOUNTER — Telehealth: Payer: Self-pay | Admitting: Neurology

## 2024-04-13 ENCOUNTER — Other Ambulatory Visit: Payer: Self-pay

## 2024-04-13 DIAGNOSIS — M79671 Pain in right foot: Secondary | ICD-10-CM | POA: Insufficient documentation

## 2024-04-13 MED ORDER — GABAPENTIN 400 MG PO CAPS: ORAL_CAPSULE | ORAL | 1 refills | Status: DC

## 2024-04-13 NOTE — Telephone Encounter (Signed)
Filled - done

## 2024-04-13 NOTE — Telephone Encounter (Signed)
 Pt. Calling for Rx of Gabapentin  At [facility] [address] Rx 3 a day 400 mg

## 2024-06-11 ENCOUNTER — Emergency Department (HOSPITAL_COMMUNITY)

## 2024-06-11 ENCOUNTER — Inpatient Hospital Stay (HOSPITAL_COMMUNITY)
Admission: EM | Admit: 2024-06-11 | Discharge: 2024-06-15 | DRG: 494 | Disposition: A | Discharge status: Rehabilitation Facility | Attending: Family Medicine | Admitting: Family Medicine

## 2024-06-11 ENCOUNTER — Other Ambulatory Visit: Payer: Self-pay

## 2024-06-11 DIAGNOSIS — Z8616 Personal history of COVID-19: Secondary | ICD-10-CM

## 2024-06-11 DIAGNOSIS — E559 Vitamin D deficiency, unspecified: Secondary | ICD-10-CM | POA: Diagnosis present

## 2024-06-11 DIAGNOSIS — S82101A Unspecified fracture of upper end of right tibia, initial encounter for closed fracture: Secondary | ICD-10-CM | POA: Diagnosis present

## 2024-06-11 DIAGNOSIS — M810 Age-related osteoporosis without current pathological fracture: Secondary | ICD-10-CM | POA: Diagnosis present

## 2024-06-11 DIAGNOSIS — Z9102 Food additives allergy status: Secondary | ICD-10-CM

## 2024-06-11 DIAGNOSIS — Z885 Allergy status to narcotic agent status: Secondary | ICD-10-CM

## 2024-06-11 DIAGNOSIS — Z7982 Long term (current) use of aspirin: Secondary | ICD-10-CM

## 2024-06-11 DIAGNOSIS — E66811 Obesity, class 1: Secondary | ICD-10-CM | POA: Diagnosis present

## 2024-06-11 DIAGNOSIS — F32A Depression, unspecified: Secondary | ICD-10-CM | POA: Diagnosis present

## 2024-06-11 DIAGNOSIS — S82141A Displaced bicondylar fracture of right tibia, initial encounter for closed fracture: Secondary | ICD-10-CM | POA: Diagnosis not present

## 2024-06-11 DIAGNOSIS — Z7984 Long term (current) use of oral hypoglycemic drugs: Secondary | ICD-10-CM

## 2024-06-11 DIAGNOSIS — Z79899 Other long term (current) drug therapy: Secondary | ICD-10-CM

## 2024-06-11 DIAGNOSIS — W19XXXA Unspecified fall, initial encounter: Principal | ICD-10-CM

## 2024-06-11 DIAGNOSIS — W010XXA Fall on same level from slipping, tripping and stumbling without subsequent striking against object, initial encounter: Secondary | ICD-10-CM | POA: Diagnosis present

## 2024-06-11 DIAGNOSIS — Z881 Allergy status to other antibiotic agents status: Secondary | ICD-10-CM

## 2024-06-11 DIAGNOSIS — J45909 Unspecified asthma, uncomplicated: Secondary | ICD-10-CM | POA: Diagnosis present

## 2024-06-11 DIAGNOSIS — Z83438 Family history of other disorder of lipoprotein metabolism and other lipidemia: Secondary | ICD-10-CM

## 2024-06-11 DIAGNOSIS — S82451A Displaced comminuted fracture of shaft of right fibula, initial encounter for closed fracture: Secondary | ICD-10-CM

## 2024-06-11 DIAGNOSIS — I10 Essential (primary) hypertension: Secondary | ICD-10-CM | POA: Diagnosis present

## 2024-06-11 DIAGNOSIS — M329 Systemic lupus erythematosus, unspecified: Secondary | ICD-10-CM | POA: Diagnosis present

## 2024-06-11 DIAGNOSIS — Z7983 Long term (current) use of bisphosphonates: Secondary | ICD-10-CM

## 2024-06-11 DIAGNOSIS — Y92008 Other place in unspecified non-institutional (private) residence as the place of occurrence of the external cause: Secondary | ICD-10-CM

## 2024-06-11 DIAGNOSIS — E1142 Type 2 diabetes mellitus with diabetic polyneuropathy: Secondary | ICD-10-CM | POA: Diagnosis present

## 2024-06-11 DIAGNOSIS — F419 Anxiety disorder, unspecified: Secondary | ICD-10-CM | POA: Diagnosis present

## 2024-06-11 DIAGNOSIS — E119 Type 2 diabetes mellitus without complications: Secondary | ICD-10-CM

## 2024-06-11 DIAGNOSIS — S82401A Unspecified fracture of shaft of right fibula, initial encounter for closed fracture: Secondary | ICD-10-CM | POA: Diagnosis present

## 2024-06-11 DIAGNOSIS — Z6831 Body mass index (BMI) 31.0-31.9, adult: Secondary | ICD-10-CM

## 2024-06-11 NOTE — ED Triage Notes (Signed)
 PT bib gems FROM HOME. PT REPORTS having right knee pain. Pt reports having a fall at 4pm today. Pulse present in foot. Denies LOC, not on thinners. Pt right knee swollen.  162/90 83HR 18RR 98% RA CBG 145 96.3 T

## 2024-06-12 ENCOUNTER — Emergency Department (HOSPITAL_COMMUNITY)

## 2024-06-12 ENCOUNTER — Encounter (HOSPITAL_COMMUNITY)
Admission: EM | Disposition: A | Discharge status: Rehabilitation Facility | Payer: Self-pay | Source: Home / Self Care | Attending: Family Medicine

## 2024-06-12 ENCOUNTER — Inpatient Hospital Stay (HOSPITAL_COMMUNITY)

## 2024-06-12 ENCOUNTER — Inpatient Hospital Stay (HOSPITAL_COMMUNITY): Admitting: Anesthesiology

## 2024-06-12 ENCOUNTER — Encounter (HOSPITAL_COMMUNITY): Payer: Self-pay | Admitting: Family Medicine

## 2024-06-12 DIAGNOSIS — R21 Rash and other nonspecific skin eruption: Secondary | ICD-10-CM | POA: Diagnosis not present

## 2024-06-12 DIAGNOSIS — S82101A Unspecified fracture of upper end of right tibia, initial encounter for closed fracture: Secondary | ICD-10-CM

## 2024-06-12 DIAGNOSIS — Z9102 Food additives allergy status: Secondary | ICD-10-CM | POA: Diagnosis not present

## 2024-06-12 DIAGNOSIS — E119 Type 2 diabetes mellitus without complications: Secondary | ICD-10-CM | POA: Diagnosis not present

## 2024-06-12 DIAGNOSIS — I10 Essential (primary) hypertension: Secondary | ICD-10-CM | POA: Diagnosis present

## 2024-06-12 DIAGNOSIS — M7989 Other specified soft tissue disorders: Secondary | ICD-10-CM | POA: Diagnosis not present

## 2024-06-12 DIAGNOSIS — R600 Localized edema: Secondary | ICD-10-CM | POA: Diagnosis not present

## 2024-06-12 DIAGNOSIS — E66811 Obesity, class 1: Secondary | ICD-10-CM | POA: Diagnosis present

## 2024-06-12 DIAGNOSIS — Z7983 Long term (current) use of bisphosphonates: Secondary | ICD-10-CM | POA: Diagnosis not present

## 2024-06-12 DIAGNOSIS — Z79899 Other long term (current) drug therapy: Secondary | ICD-10-CM | POA: Diagnosis not present

## 2024-06-12 DIAGNOSIS — M329 Systemic lupus erythematosus, unspecified: Secondary | ICD-10-CM | POA: Diagnosis present

## 2024-06-12 DIAGNOSIS — J45909 Unspecified asthma, uncomplicated: Secondary | ICD-10-CM | POA: Diagnosis present

## 2024-06-12 DIAGNOSIS — R7401 Elevation of levels of liver transaminase levels: Secondary | ICD-10-CM | POA: Diagnosis not present

## 2024-06-12 DIAGNOSIS — F418 Other specified anxiety disorders: Secondary | ICD-10-CM | POA: Diagnosis not present

## 2024-06-12 DIAGNOSIS — M810 Age-related osteoporosis without current pathological fracture: Secondary | ICD-10-CM | POA: Diagnosis present

## 2024-06-12 DIAGNOSIS — S82101D Unspecified fracture of upper end of right tibia, subsequent encounter for closed fracture with routine healing: Secondary | ICD-10-CM | POA: Diagnosis not present

## 2024-06-12 DIAGNOSIS — I951 Orthostatic hypotension: Secondary | ICD-10-CM | POA: Diagnosis not present

## 2024-06-12 DIAGNOSIS — W1839XD Other fall on same level, subsequent encounter: Secondary | ICD-10-CM | POA: Diagnosis not present

## 2024-06-12 DIAGNOSIS — Z90710 Acquired absence of both cervix and uterus: Secondary | ICD-10-CM | POA: Diagnosis not present

## 2024-06-12 DIAGNOSIS — Z7982 Long term (current) use of aspirin: Secondary | ICD-10-CM | POA: Diagnosis not present

## 2024-06-12 DIAGNOSIS — S82424A Nondisplaced transverse fracture of shaft of right fibula, initial encounter for closed fracture: Secondary | ICD-10-CM | POA: Diagnosis not present

## 2024-06-12 DIAGNOSIS — S82451A Displaced comminuted fracture of shaft of right fibula, initial encounter for closed fracture: Secondary | ICD-10-CM | POA: Diagnosis not present

## 2024-06-12 DIAGNOSIS — Z885 Allergy status to narcotic agent status: Secondary | ICD-10-CM | POA: Diagnosis not present

## 2024-06-12 DIAGNOSIS — S82141A Displaced bicondylar fracture of right tibia, initial encounter for closed fracture: Secondary | ICD-10-CM | POA: Diagnosis present

## 2024-06-12 DIAGNOSIS — F32A Depression, unspecified: Secondary | ICD-10-CM | POA: Diagnosis present

## 2024-06-12 DIAGNOSIS — E559 Vitamin D deficiency, unspecified: Secondary | ICD-10-CM | POA: Diagnosis present

## 2024-06-12 DIAGNOSIS — Z7984 Long term (current) use of oral hypoglycemic drugs: Secondary | ICD-10-CM | POA: Diagnosis not present

## 2024-06-12 DIAGNOSIS — R748 Abnormal levels of other serum enzymes: Secondary | ICD-10-CM | POA: Diagnosis not present

## 2024-06-12 DIAGNOSIS — Z6831 Body mass index (BMI) 31.0-31.9, adult: Secondary | ICD-10-CM | POA: Diagnosis not present

## 2024-06-12 DIAGNOSIS — R110 Nausea: Secondary | ICD-10-CM | POA: Diagnosis not present

## 2024-06-12 DIAGNOSIS — S82401A Unspecified fracture of shaft of right fibula, initial encounter for closed fracture: Secondary | ICD-10-CM | POA: Diagnosis present

## 2024-06-12 DIAGNOSIS — G8918 Other acute postprocedural pain: Secondary | ICD-10-CM | POA: Diagnosis not present

## 2024-06-12 DIAGNOSIS — F419 Anxiety disorder, unspecified: Secondary | ICD-10-CM | POA: Diagnosis present

## 2024-06-12 DIAGNOSIS — Z8616 Personal history of COVID-19: Secondary | ICD-10-CM | POA: Diagnosis not present

## 2024-06-12 DIAGNOSIS — W010XXA Fall on same level from slipping, tripping and stumbling without subsequent striking against object, initial encounter: Secondary | ICD-10-CM | POA: Diagnosis present

## 2024-06-12 DIAGNOSIS — Z881 Allergy status to other antibiotic agents status: Secondary | ICD-10-CM | POA: Diagnosis not present

## 2024-06-12 DIAGNOSIS — L089 Local infection of the skin and subcutaneous tissue, unspecified: Secondary | ICD-10-CM | POA: Diagnosis not present

## 2024-06-12 DIAGNOSIS — Z83438 Family history of other disorder of lipoprotein metabolism and other lipidemia: Secondary | ICD-10-CM | POA: Diagnosis not present

## 2024-06-12 DIAGNOSIS — R42 Dizziness and giddiness: Secondary | ICD-10-CM | POA: Diagnosis not present

## 2024-06-12 DIAGNOSIS — L2989 Other pruritus: Secondary | ICD-10-CM | POA: Diagnosis not present

## 2024-06-12 DIAGNOSIS — Z794 Long term (current) use of insulin: Secondary | ICD-10-CM | POA: Diagnosis not present

## 2024-06-12 DIAGNOSIS — M79604 Pain in right leg: Secondary | ICD-10-CM | POA: Diagnosis not present

## 2024-06-12 DIAGNOSIS — S82101S Unspecified fracture of upper end of right tibia, sequela: Secondary | ICD-10-CM | POA: Diagnosis not present

## 2024-06-12 DIAGNOSIS — E1142 Type 2 diabetes mellitus with diabetic polyneuropathy: Secondary | ICD-10-CM | POA: Diagnosis present

## 2024-06-12 DIAGNOSIS — D62 Acute posthemorrhagic anemia: Secondary | ICD-10-CM | POA: Diagnosis not present

## 2024-06-12 DIAGNOSIS — Y92008 Other place in unspecified non-institutional (private) residence as the place of occurrence of the external cause: Secondary | ICD-10-CM | POA: Diagnosis not present

## 2024-06-12 DIAGNOSIS — W19XXXA Unspecified fall, initial encounter: Secondary | ICD-10-CM | POA: Diagnosis not present

## 2024-06-12 DIAGNOSIS — F329 Major depressive disorder, single episode, unspecified: Secondary | ICD-10-CM | POA: Diagnosis present

## 2024-06-12 HISTORY — PX: ORIF TIBIA FRACTURE: SHX5416

## 2024-06-12 LAB — I-STAT CHEM 8, ED
BUN: 22 mg/dL (ref 8–23)
Calcium, Ion: 1.2 mmol/L (ref 1.15–1.40)
Chloride: 106 mmol/L (ref 98–111)
Creatinine, Ser: 0.7 mg/dL (ref 0.44–1.00)
Glucose, Bld: 126 mg/dL — ABNORMAL HIGH (ref 70–99)
HCT: 41 % (ref 36.0–46.0)
Hemoglobin: 13.9 g/dL (ref 12.0–15.0)
Potassium: 4.1 mmol/L (ref 3.5–5.1)
Sodium: 138 mmol/L (ref 135–145)
TCO2: 20 mmol/L — ABNORMAL LOW (ref 22–32)

## 2024-06-12 LAB — CBC WITH DIFFERENTIAL/PLATELET
Abs Immature Granulocytes: 0.02 K/uL (ref 0.00–0.07)
Basophils Absolute: 0 K/uL (ref 0.0–0.1)
Basophils Relative: 1 %
Eosinophils Absolute: 0.1 K/uL (ref 0.0–0.5)
Eosinophils Relative: 1 %
HCT: 40.8 % (ref 36.0–46.0)
Hemoglobin: 13.6 g/dL (ref 12.0–15.0)
Immature Granulocytes: 0 %
Lymphocytes Relative: 29 %
Lymphs Abs: 2.2 K/uL (ref 0.7–4.0)
MCH: 28.6 pg (ref 26.0–34.0)
MCHC: 33.3 g/dL (ref 30.0–36.0)
MCV: 85.7 fL (ref 80.0–100.0)
Monocytes Absolute: 0.6 K/uL (ref 0.1–1.0)
Monocytes Relative: 8 %
Neutro Abs: 4.7 K/uL (ref 1.7–7.7)
Neutrophils Relative %: 61 %
Platelets: 186 K/uL (ref 150–400)
RBC: 4.76 MIL/uL (ref 3.87–5.11)
RDW: 14.2 % (ref 11.5–15.5)
WBC: 7.6 K/uL (ref 4.0–10.5)
nRBC: 0 % (ref 0.0–0.2)

## 2024-06-12 LAB — GLUCOSE, CAPILLARY
Glucose-Capillary: 151 mg/dL — ABNORMAL HIGH (ref 70–99)
Glucose-Capillary: 123 mg/dL — ABNORMAL HIGH (ref 70–99)
Glucose-Capillary: 164 mg/dL — ABNORMAL HIGH (ref 70–99)
Glucose-Capillary: 150 mg/dL — ABNORMAL HIGH (ref 70–99)

## 2024-06-12 LAB — CBC
HCT: 39 % (ref 36.0–46.0)
HCT: 37.6 % (ref 36.0–46.0)
Hemoglobin: 12.9 g/dL (ref 12.0–15.0)
Hemoglobin: 12.4 g/dL (ref 12.0–15.0)
MCH: 28.8 pg (ref 26.0–34.0)
MCH: 28.8 pg (ref 26.0–34.0)
MCHC: 33.1 g/dL (ref 30.0–36.0)
MCHC: 33 g/dL (ref 30.0–36.0)
MCV: 87.1 fL (ref 80.0–100.0)
MCV: 87.2 fL (ref 80.0–100.0)
Platelets: 176 K/uL (ref 150–400)
Platelets: 148 K/uL — ABNORMAL LOW (ref 150–400)
RBC: 4.48 MIL/uL (ref 3.87–5.11)
RBC: 4.31 MIL/uL (ref 3.87–5.11)
RDW: 14.2 % (ref 11.5–15.5)
RDW: 14.6 % (ref 11.5–15.5)
WBC: 6.3 K/uL (ref 4.0–10.5)
WBC: 7.3 K/uL (ref 4.0–10.5)
nRBC: 0 % (ref 0.0–0.2)
nRBC: 0 % (ref 0.0–0.2)

## 2024-06-12 LAB — CREATININE, SERUM
Creatinine, Ser: 0.75 mg/dL (ref 0.44–1.00)
GFR, Estimated: >60 mL/min (ref >60)

## 2024-06-12 LAB — SURGICAL PCR SCREEN
MRSA, PCR: NEGATIVE
Staphylococcus aureus: NEGATIVE

## 2024-06-12 LAB — CBG MONITORING, ED
Glucose-Capillary: 138 mg/dL — ABNORMAL HIGH (ref 70–99)
Glucose-Capillary: 132 mg/dL — ABNORMAL HIGH (ref 70–99)
Glucose-Capillary: 143 mg/dL — ABNORMAL HIGH (ref 70–99)

## 2024-06-12 LAB — BASIC METABOLIC PANEL WITH GFR
Anion gap: 7 (ref 5–15)
BUN: 23 mg/dL (ref 8–23)
CO2: 25 mmol/L (ref 22–32)
Calcium: 9.1 mg/dL (ref 8.9–10.3)
Chloride: 104 mmol/L (ref 98–111)
Creatinine, Ser: 0.78 mg/dL (ref 0.44–1.00)
GFR, Estimated: >60 mL/min (ref >60)
Glucose, Bld: 138 mg/dL — ABNORMAL HIGH (ref 70–99)
Potassium: 3.9 mmol/L (ref 3.5–5.1)
Sodium: 136 mmol/L (ref 135–145)

## 2024-06-12 LAB — VITAMIN D 25 HYDROXY (VIT D DEFICIENCY, FRACTURES): Vit D, 25-Hydroxy: 8.28 ng/mL — ABNORMAL LOW (ref 30–100)

## 2024-06-12 LAB — HIV ANTIBODY (ROUTINE TESTING W REFLEX): HIV Screen 4th Generation wRfx: NONREACTIVE

## 2024-06-12 SURGERY — OPEN REDUCTION INTERNAL FIXATION (ORIF) TIBIA FRACTURE
Anesthesia: General | Laterality: Right

## 2024-06-12 MED ORDER — OXYCODONE HCL 5 MG PO TABS
5.0000 mg | ORAL_TABLET | ORAL | Status: DC | PRN
  Administered 2024-06-12 – 2024-06-13 (×3): 10 mg via ORAL
  Filled 2024-06-12 (×3): qty 2

## 2024-06-12 MED ORDER — LIDOCAINE 2% (20 MG/ML) 5 ML SYRINGE
INTRAMUSCULAR | Status: DC | PRN
  Administered 2024-06-12: 100 mg via INTRAVENOUS

## 2024-06-12 MED ORDER — GABAPENTIN 400 MG PO CAPS
400.0000 mg | ORAL_CAPSULE | Freq: Three times a day (TID) | ORAL | Status: DC
  Administered 2024-06-12 – 2024-06-15 (×9): 400 mg via ORAL
  Filled 2024-06-12 (×9): qty 1

## 2024-06-12 MED ORDER — FENTANYL CITRATE (PF) 250 MCG/5ML IJ SOLN
INTRAMUSCULAR | Status: AC
  Filled 2024-06-12: qty 5

## 2024-06-12 MED ORDER — CHLORHEXIDINE GLUCONATE 0.12 % MT SOLN: 15.0000 mL | Freq: Once | OROMUCOSAL | Status: AC

## 2024-06-12 MED ORDER — SERTRALINE HCL 100 MG PO TABS
100.0000 mg | ORAL_TABLET | Freq: Every day | ORAL | Status: DC
  Administered 2024-06-12 – 2024-06-14 (×3): 100 mg via ORAL
  Filled 2024-06-12 (×3): qty 1

## 2024-06-12 MED ORDER — ORAL CARE MOUTH RINSE: 15.0000 mL | Freq: Once | OROMUCOSAL | Status: AC

## 2024-06-12 MED ORDER — FENTANYL CITRATE (PF) 100 MCG/2ML IJ SOLN
INTRAMUSCULAR | Status: AC
  Filled 2024-06-12: qty 2

## 2024-06-12 MED ORDER — PROPOFOL 10 MG/ML IV BOLUS
INTRAVENOUS | Status: DC | PRN
  Administered 2024-06-12: 160 mg via INTRAVENOUS

## 2024-06-12 MED ORDER — ACETAMINOPHEN 650 MG RE SUPP: 650.0000 mg | Freq: Four times a day (QID) | RECTAL | Status: DC

## 2024-06-12 MED ORDER — CEFAZOLIN SODIUM-DEXTROSE 2-4 GM/100ML-% IV SOLN
2.0000 g | INTRAVENOUS | Status: AC
  Administered 2024-06-12: 2 g via INTRAVENOUS
  Filled 2024-06-12: qty 100

## 2024-06-12 MED ORDER — FENTANYL CITRATE PF 50 MCG/ML IJ SOSY
12.5000 ug | PREFILLED_SYRINGE | INTRAMUSCULAR | Status: DC | PRN
  Administered 2024-06-12: 50 ug via INTRAVENOUS
  Filled 2024-06-12: qty 1

## 2024-06-12 MED ORDER — ONDANSETRON HCL 4 MG/2ML IJ SOLN
INTRAMUSCULAR | Status: DC | PRN
  Administered 2024-06-12: 4 mg via INTRAVENOUS

## 2024-06-12 MED ORDER — LOSARTAN POTASSIUM 25 MG PO TABS
25.0000 mg | ORAL_TABLET | Freq: Every day | ORAL | Status: DC
  Administered 2024-06-12 – 2024-06-15 (×4): 25 mg via ORAL
  Filled 2024-06-12 (×4): qty 1

## 2024-06-12 MED ORDER — CHLORHEXIDINE GLUCONATE 0.12 % MT SOLN
OROMUCOSAL | Status: AC
  Administered 2024-06-12: 15 mL via OROMUCOSAL
  Filled 2024-06-12: qty 15

## 2024-06-12 MED ORDER — MIDAZOLAM HCL 2 MG/2ML IJ SOLN
INTRAMUSCULAR | Status: AC
  Filled 2024-06-12: qty 2

## 2024-06-12 MED ORDER — ROCURONIUM BROMIDE 10 MG/ML (PF) SYRINGE
PREFILLED_SYRINGE | INTRAVENOUS | Status: DC | PRN
  Administered 2024-06-12: 60 mg via INTRAVENOUS

## 2024-06-12 MED ORDER — CHLORHEXIDINE GLUCONATE 4 % EX SOLN: 60.0000 mL | Freq: Once | CUTANEOUS | Status: DC

## 2024-06-12 MED ORDER — ONDANSETRON HCL 4 MG/2ML IJ SOLN: 4.0000 mg | Freq: Four times a day (QID) | INTRAMUSCULAR | Status: DC | PRN

## 2024-06-12 MED ORDER — INSULIN ASPART 100 UNIT/ML IJ SOLN: 0.0000 [IU] | INTRAMUSCULAR | Status: DC | PRN

## 2024-06-12 MED ORDER — ONDANSETRON HCL 4 MG/2ML IJ SOLN
INTRAMUSCULAR | Status: AC
  Filled 2024-06-12: qty 2

## 2024-06-12 MED ORDER — OXYCODONE HCL 5 MG PO TABS
5.0000 mg | ORAL_TABLET | ORAL | Status: DC | PRN
  Administered 2024-06-12: 5 mg via ORAL
  Filled 2024-06-12: qty 1

## 2024-06-12 MED ORDER — ACETAMINOPHEN 10 MG/ML IV SOLN
INTRAVENOUS | Status: DC | PRN
  Administered 2024-06-12: 1000 mg via INTRAVENOUS

## 2024-06-12 MED ORDER — PHENYLEPHRINE 80 MCG/ML (10ML) SYRINGE FOR IV PUSH (FOR BLOOD PRESSURE SUPPORT)
PREFILLED_SYRINGE | INTRAVENOUS | Status: DC | PRN
  Administered 2024-06-12: 160 ug via INTRAVENOUS
  Administered 2024-06-12: 80 ug via INTRAVENOUS

## 2024-06-12 MED ORDER — INSULIN ASPART 100 UNIT/ML IJ SOLN
0.0000 [IU] | INTRAMUSCULAR | Status: DC
  Administered 2024-06-13 – 2024-06-14 (×2): 1 [IU] via SUBCUTANEOUS
  Filled 2024-06-12: qty 0.06

## 2024-06-12 MED ORDER — SODIUM CHLORIDE 0.9 % IV SOLN: INTRAVENOUS | Status: AC

## 2024-06-12 MED ORDER — SODIUM CHLORIDE 0.9% FLUSH
3.0000 mL | Freq: Two times a day (BID) | INTRAVENOUS | Status: DC
  Administered 2024-06-12 – 2024-06-15 (×8): 3 mL via INTRAVENOUS

## 2024-06-12 MED ORDER — 0.9 % SODIUM CHLORIDE (POUR BTL) OPTIME
TOPICAL | Status: DC | PRN
  Administered 2024-06-12: 1000 mL

## 2024-06-12 MED ORDER — DEXAMETHASONE SODIUM PHOSPHATE 10 MG/ML IJ SOLN
INTRAMUSCULAR | Status: AC
  Filled 2024-06-12: qty 1

## 2024-06-12 MED ORDER — SUGAMMADEX SODIUM 200 MG/2ML IV SOLN
INTRAVENOUS | Status: AC
  Filled 2024-06-12: qty 2

## 2024-06-12 MED ORDER — ROCURONIUM BROMIDE 10 MG/ML (PF) SYRINGE
PREFILLED_SYRINGE | INTRAVENOUS | Status: AC
  Filled 2024-06-12: qty 10

## 2024-06-12 MED ORDER — VANCOMYCIN HCL 1000 MG IV SOLR
INTRAVENOUS | Status: AC
  Filled 2024-06-12: qty 20

## 2024-06-12 MED ORDER — FENTANYL CITRATE PF 50 MCG/ML IJ SOSY
50.0000 ug | PREFILLED_SYRINGE | Freq: Once | INTRAMUSCULAR | Status: AC
  Administered 2024-06-12: 50 ug via INTRAVENOUS
  Filled 2024-06-12: qty 1

## 2024-06-12 MED ORDER — ONDANSETRON HCL 4 MG/2ML IJ SOLN
4.0000 mg | Freq: Once | INTRAMUSCULAR | Status: AC
  Administered 2024-06-12: 4 mg via INTRAVENOUS

## 2024-06-12 MED ORDER — SUGAMMADEX SODIUM 200 MG/2ML IV SOLN
INTRAVENOUS | Status: DC | PRN
  Administered 2024-06-12: 200 mg via INTRAVENOUS

## 2024-06-12 MED ORDER — METHOCARBAMOL 1000 MG/10ML IJ SOLN: 500.0000 mg | Freq: Four times a day (QID) | INTRAMUSCULAR | Status: DC | PRN

## 2024-06-12 MED ORDER — FENTANYL CITRATE (PF) 250 MCG/5ML IJ SOLN
INTRAMUSCULAR | Status: DC | PRN
  Administered 2024-06-12: 25 ug via INTRAVENOUS
  Administered 2024-06-12: 100 ug via INTRAVENOUS
  Administered 2024-06-12: 75 ug via INTRAVENOUS
  Administered 2024-06-12: 50 ug via INTRAVENOUS

## 2024-06-12 MED ORDER — INSULIN ASPART 100 UNIT/ML IJ SOLN
INTRAMUSCULAR | Status: AC
  Administered 2024-06-12: 2 [IU] via SUBCUTANEOUS
  Filled 2024-06-12: qty 1

## 2024-06-12 MED ORDER — FENTANYL CITRATE (PF) 100 MCG/2ML IJ SOLN
25.0000 ug | INTRAMUSCULAR | Status: DC | PRN
  Administered 2024-06-12 (×2): 25 ug via INTRAVENOUS
  Administered 2024-06-12: 50 ug via INTRAVENOUS

## 2024-06-12 MED ORDER — DEXAMETHASONE SODIUM PHOSPHATE 10 MG/ML IJ SOLN
INTRAMUSCULAR | Status: DC | PRN
  Administered 2024-06-12: 5 mg via INTRAVENOUS

## 2024-06-12 MED ORDER — PROCHLORPERAZINE EDISYLATE 10 MG/2ML IJ SOLN
5.0000 mg | Freq: Four times a day (QID) | INTRAMUSCULAR | Status: DC | PRN
  Administered 2024-06-13 – 2024-06-14 (×2): 5 mg via INTRAVENOUS
  Filled 2024-06-12 (×2): qty 2

## 2024-06-12 MED ORDER — PHENYLEPHRINE HCL-NACL 20-0.9 MG/250ML-% IV SOLN
INTRAVENOUS | Status: DC | PRN
  Administered 2024-06-12: 75 ug/min via INTRAVENOUS

## 2024-06-12 MED ORDER — OXYCODONE HCL 5 MG PO TABS: 5.0000 mg | ORAL_TABLET | Freq: Once | ORAL | Status: DC | PRN

## 2024-06-12 MED ORDER — POVIDONE-IODINE 10 % EX SWAB
2.0000 | Freq: Once | CUTANEOUS | Status: AC
  Administered 2024-06-12: 2 via TOPICAL

## 2024-06-12 MED ORDER — MIDAZOLAM HCL 2 MG/2ML IJ SOLN
INTRAMUSCULAR | Status: DC | PRN
  Administered 2024-06-12: 2 mg via INTRAVENOUS

## 2024-06-12 MED ORDER — DOCUSATE SODIUM 100 MG PO CAPS
100.0000 mg | ORAL_CAPSULE | Freq: Two times a day (BID) | ORAL | Status: DC
  Administered 2024-06-13 (×2): 100 mg via ORAL
  Filled 2024-06-12 (×6): qty 1

## 2024-06-12 MED ORDER — ACETAMINOPHEN 325 MG PO TABS
650.0000 mg | ORAL_TABLET | Freq: Four times a day (QID) | ORAL | Status: DC | PRN
Start: 2024-06-12 — End: 2024-06-12

## 2024-06-12 MED ORDER — CARMEX CLASSIC LIP BALM EX OINT
TOPICAL_OINTMENT | CUTANEOUS | Status: DC | PRN
  Filled 2024-06-12: qty 10

## 2024-06-12 MED ORDER — ACETAMINOPHEN 325 MG PO TABS
650.0000 mg | ORAL_TABLET | Freq: Four times a day (QID) | ORAL | Status: DC
  Administered 2024-06-12 – 2024-06-13 (×2): 650 mg via ORAL
  Filled 2024-06-12 (×3): qty 2

## 2024-06-12 MED ORDER — LIDOCAINE 2% (20 MG/ML) 5 ML SYRINGE
INTRAMUSCULAR | Status: AC
  Filled 2024-06-12: qty 5

## 2024-06-12 MED ORDER — ENOXAPARIN SODIUM 40 MG/0.4ML IJ SOSY
40.0000 mg | PREFILLED_SYRINGE | INTRAMUSCULAR | Status: DC
  Administered 2024-06-13 – 2024-06-15 (×3): 40 mg via SUBCUTANEOUS
  Filled 2024-06-12 (×3): qty 0.4

## 2024-06-12 MED ORDER — ONDANSETRON HCL 4 MG/2ML IJ SOLN
4.0000 mg | Freq: Once | INTRAMUSCULAR | Status: AC
  Administered 2024-06-12: 4 mg via INTRAVENOUS
  Filled 2024-06-12: qty 2

## 2024-06-12 MED ORDER — VANCOMYCIN HCL 1000 MG IV SOLR
INTRAVENOUS | Status: DC | PRN
  Administered 2024-06-12: 1000 mg

## 2024-06-12 MED ORDER — ACETAMINOPHEN 650 MG RE SUPP: 650.0000 mg | Freq: Four times a day (QID) | RECTAL | Status: DC | PRN

## 2024-06-12 MED ORDER — OXYCODONE HCL 5 MG/5ML PO SOLN: 5.0000 mg | Freq: Once | ORAL | Status: DC | PRN

## 2024-06-12 MED ORDER — LORATADINE 10 MG PO TABS
10.0000 mg | ORAL_TABLET | Freq: Every day | ORAL | Status: DC
  Administered 2024-06-12 – 2024-06-15 (×4): 10 mg via ORAL
  Filled 2024-06-12 (×4): qty 1

## 2024-06-12 MED ORDER — CEFAZOLIN SODIUM-DEXTROSE 2-4 GM/100ML-% IV SOLN
2.0000 g | Freq: Three times a day (TID) | INTRAVENOUS | Status: AC
Start: 2024-06-12 — End: 2024-06-13
  Administered 2024-06-12 – 2024-06-13 (×3): 2 g via INTRAVENOUS
  Filled 2024-06-12 (×3): qty 100

## 2024-06-12 MED ORDER — HYDROMORPHONE HCL 1 MG/ML IJ SOLN
0.5000 mg | INTRAMUSCULAR | Status: DC | PRN
  Administered 2024-06-12 – 2024-06-13 (×4): 0.5 mg via INTRAVENOUS
  Filled 2024-06-12: qty 1
  Filled 2024-06-12 (×2): qty 0.5
  Filled 2024-06-12: qty 1

## 2024-06-12 MED ORDER — TRANEXAMIC ACID-NACL 1000-0.7 MG/100ML-% IV SOLN
1000.0000 mg | INTRAVENOUS | Status: AC
  Administered 2024-06-12: 1000 mg via INTRAVENOUS
  Filled 2024-06-12: qty 100

## 2024-06-12 MED ORDER — METHOCARBAMOL 500 MG PO TABS
500.0000 mg | ORAL_TABLET | Freq: Four times a day (QID) | ORAL | Status: DC | PRN
  Administered 2024-06-12 – 2024-06-14 (×7): 500 mg via ORAL
  Filled 2024-06-12 (×7): qty 1

## 2024-06-12 MED ORDER — LACTATED RINGERS IV SOLN: INTRAVENOUS | Status: DC

## 2024-06-12 MED ORDER — DIPHENHYDRAMINE HCL 12.5 MG/5ML PO ELIX
12.5000 mg | ORAL_SOLUTION | ORAL | Status: DC | PRN
  Filled 2024-06-12: qty 10

## 2024-06-12 MED ORDER — POLYETHYLENE GLYCOL 3350 17 G PO PACK: 17.0000 g | PACK | Freq: Every day | ORAL | Status: DC | PRN

## 2024-06-12 SURGICAL SUPPLY — 61 items
BAG COUNTER SPONGE SURGICOUNT (BAG) IMPLANT
BANDAGE ESMARK 6X9 LF (GAUZE/BANDAGES/DRESSINGS) IMPLANT
BENZOIN TINCTURE PRP APPL 2/3 (GAUZE/BANDAGES/DRESSINGS) IMPLANT
BIT DRILL 2.5 NCB (BIT) IMPLANT
BIT DRILL 3.3 LONG (BIT) IMPLANT
BIT DRILL NCB 2.5 (BIT) IMPLANT
BIT DRILL QC 3.3X195 (BIT) IMPLANT
BLADE CLIPPER SURG (BLADE) IMPLANT
BNDG ELASTIC 4INX 5YD STR LF (GAUZE/BANDAGES/DRESSINGS) IMPLANT
BNDG ELASTIC 4X5.8 VLCR STR LF (GAUZE/BANDAGES/DRESSINGS) ×2 IMPLANT
BNDG ELASTIC 6INX 5YD STR LF (GAUZE/BANDAGES/DRESSINGS) ×2 IMPLANT
BNDG GAUZE DERMACEA FLUFF 4 (GAUZE/BANDAGES/DRESSINGS) ×2 IMPLANT
BRUSH SCRUB EZ PLAIN DRY (MISCELLANEOUS) ×4 IMPLANT
CAP LOCK NCB (Cap) IMPLANT
CHLORAPREP W/TINT 26 (MISCELLANEOUS) ×2 IMPLANT
COVER SURGICAL LIGHT HANDLE (MISCELLANEOUS) ×2 IMPLANT
CUFF TRNQT CYL 34X4.125X (TOURNIQUET CUFF) ×2 IMPLANT
DRAPE C-ARM 42X72 X-RAY (DRAPES) ×2 IMPLANT
DRAPE C-ARMOR (DRAPES) ×2 IMPLANT
DRAPE SURG ORHT 6 SPLT 77X108 (DRAPES) ×4 IMPLANT
DRAPE U-SHAPE 47X51 STRL (DRAPES) ×2 IMPLANT
DRSG ADAPTIC 3X8 NADH LF (GAUZE/BANDAGES/DRESSINGS) ×2 IMPLANT
ELECTRODE REM PT RTRN 9FT ADLT (ELECTROSURGICAL) ×2 IMPLANT
GAUZE PAD ABD 8X10 STRL (GAUZE/BANDAGES/DRESSINGS) ×8 IMPLANT
GAUZE SPONGE 4X4 12PLY STRL (GAUZE/BANDAGES/DRESSINGS) ×2 IMPLANT
GLOVE BIO SURGEON STRL SZ 6.5 (GLOVE) ×6 IMPLANT
GLOVE BIO SURGEON STRL SZ7.5 (GLOVE) ×8 IMPLANT
GLOVE BIOGEL PI IND STRL 6.5 (GLOVE) ×2 IMPLANT
GLOVE BIOGEL PI IND STRL 7.5 (GLOVE) ×2 IMPLANT
GLOVE XGUARD RR 2 7.5 (GLOVE) ×2 IMPLANT
GOWN STRL REUS W/ TWL LRG LVL3 (GOWN DISPOSABLE) ×4 IMPLANT
KIT BASIN OR (CUSTOM PROCEDURE TRAY) ×2 IMPLANT
KIT TURNOVER KIT B (KITS) ×2 IMPLANT
KWIRE FXSTD 280X2XNS SS (WIRE) IMPLANT
MANIFOLD NEPTUNE II (INSTRUMENTS) IMPLANT
NS IRRIG 1000ML POUR BTL (IV SOLUTION) ×2 IMPLANT
PACK TOTAL JOINT (CUSTOM PROCEDURE TRAY) ×2 IMPLANT
PAD ARMBOARD POSITIONER FOAM (MISCELLANEOUS) ×4 IMPLANT
PAD CAST 4YDX4 CTTN HI CHSV (CAST SUPPLIES) ×2 IMPLANT
PADDING CAST ABS COTTON 4X4 ST (CAST SUPPLIES) IMPLANT
PADDING CAST COTTON 6X4 STRL (CAST SUPPLIES) ×2 IMPLANT
PLATE LOCK TIB NCB 132 5H RT (Plate) IMPLANT
SCREW NCB 4.0MX42M (Screw) IMPLANT
SCREW NCB 4.0MX65M (Screw) IMPLANT
SCREW NCB 4.0X36MM (Screw) IMPLANT
SCREW NCB 4.0X40MM (Screw) IMPLANT
SCREW PROX ST NCB 4X80 (Screw) IMPLANT
SPONGE T-LAP 18X18 ~~LOC~~+RFID (SPONGE) IMPLANT
STAPLER SKIN PROX 35W (STAPLE) IMPLANT
STRIP CLOSURE SKIN 1/2X4 (GAUZE/BANDAGES/DRESSINGS) IMPLANT
SUCTION TUBE FRAZIER 10FR DISP (SUCTIONS) ×2 IMPLANT
SUT MNCRL AB 3-0 PS2 18 (SUTURE) ×2 IMPLANT
SUT MNCRL AB 3-0 PS2 27 (SUTURE) IMPLANT
SUT MON AB 2-0 CT1 36 (SUTURE) IMPLANT
SUT VIC AB 0 CT1 27XBRD ANBCTR (SUTURE) ×2 IMPLANT
SUT VIC AB 2-0 CT1 TAPERPNT 27 (SUTURE) ×4 IMPLANT
TOWEL GREEN STERILE (TOWEL DISPOSABLE) ×4 IMPLANT
TOWEL GREEN STERILE FF (TOWEL DISPOSABLE) ×2 IMPLANT
TRAY FOLEY MTR SLVR 16FR STAT (SET/KITS/TRAYS/PACK) IMPLANT
UNDERPAD 30X36 HEAVY ABSORB (UNDERPADS AND DIAPERS) ×2 IMPLANT
WATER STERILE IRR 1000ML POUR (IV SOLUTION) ×4 IMPLANT

## 2024-06-12 NOTE — Consult Note (Deleted)
 Reason for Consult:Left hip fx Referring Physician: Medford Dalton Time called: [id number] Time at bedside: 0855   Shirley Sullivan is an 63 y.o. female.  HPI: Shirley Sullivan slipped on some water at home and fell. She had immediate left hip pain and could not get up. She was brought to the ED where x-rays showed a left hip fx and orthopedic surgery was consulted. Orthopedic trauma was asked to take over care the next morning. She lives at home with her daughter and does not use any assistive devices to ambulate.  Past Medical History:  Diagnosis Date   Allergy    Anxiety    Arthritis    Asthma    perfumes triggers attacks. Has inhalers for rescue   COVID-19 10/09/2020   and 08/15/20   Depression    Diabetes mellitus without complication (HCC)    History of kidney stones 1998, 2015   Lupus    Septic shock (HCC)    Klebsiella oxytocin and Enterobacter cloacae bacteremia/UTI    Past Surgical History:  Procedure Laterality Date   ABDOMINAL HYSTERECTOMY  2004   APPENDECTOMY  1992   CYSTOSCOPY WITH RETROGRADE PYELOGRAM, URETEROSCOPY AND STENT PLACEMENT Left 08/11/2014   Procedure: CYSTOSCOPY WITH RETROGRADE PYELOGRAM, URETEROSCOPY AND STENT PLACEMENT,  DIGITAL FLEXIBLE URETEROSCOPE;  Surgeon: Gretel Ferrara, MD;  Location: [facility];  Service: Urology;  Laterality: Left;  request digital flexible ureteroscope   CYSTOSCOPY WITH RETROGRADE PYELOGRAM, URETEROSCOPY AND STENT PLACEMENT Left 08/23/2014   Procedure: CYSTOSCOPY WITH RETROGRADE PYELOGRAM, URETEROSCOPY AND STENT EXCHANGE;  Surgeon: Gretel Ferrara, MD;  Location: [facility];  Service: Urology;  Laterality: Left;   CYSTOSCOPY/URETEROSCOPY/HOLMIUM LASER/STENT PLACEMENT Right 11/02/2020   Procedure: CYSTOSCOPY/URETEROSCOPY/HOLMIUM LASER/STENT PLACEMENT/ REMOVAL OF RIGHT NEPHROSTOMY TUBE;  Surgeon: Ferrara Gretel, MD;  Location: [facility];  Service: Urology;  Laterality: Right;  patient will not need to be covid tested, tested positive on 11/14/20 has proof   HOLMIUM  LASER APPLICATION Left 08/23/2014   Procedure: HOLMIUM LASER APPLICATION;  Surgeon: Gretel Ferrara, MD;  Location: [facility];  Service: Urology;  Laterality: Left;   IR NEPHROSTOMY EXCHANGE RIGHT  10/17/2020   IR NEPHROSTOMY PLACEMENT RIGHT  10/10/2020   ROTATOR CUFF REPAIR Right 2007    Family History  Problem Relation Age of Onset   Hyperlipidemia Mother     Social History:  reports that she has never smoked. She has never used smokeless tobacco. She reports that she does not drink alcohol and does not use drugs.  Allergies:  Allergies  Allergen Reactions   Codeine Itching   Strawberry Extract Hives, Itching and Other (See Comments)    Can't breathe   Vibramycin [Doxycycline Calcium] Nausea And Vomiting    Medications: I have reviewed the patient's current medications.  Results for orders placed or performed during the hospital encounter of 06/11/24 (from the past 48 hours)  CBC with Differential     Status: None   Collection Time: 06/12/24  2:15 AM  Result Value Ref Range   WBC 7.6 4.0 - 10.5 K/uL   RBC 4.76 3.87 - 5.11 MIL/uL   Hemoglobin 13.6 12.0 - 15.0 g/dL   HCT 59.1 63.9 - 53.9 %   MCV 85.7 80.0 - 100.0 fL   MCH 28.6 26.0 - 34.0 pg   MCHC 33.3 30.0 - 36.0 g/dL   RDW 85.7 88.4 - 84.4 %   Platelets 186 150 - 400 K/uL   nRBC 0.0 0.0 - 0.2 %   Neutrophils Relative % 61 %   Neutro  Abs 4.7 1.7 - 7.7 K/uL   Lymphocytes Relative 29 %   Lymphs Abs 2.2 0.7 - 4.0 K/uL   Monocytes Relative 8 %   Monocytes Absolute 0.6 0.1 - 1.0 K/uL   Eosinophils Relative 1 %   Eosinophils Absolute 0.1 0.0 - 0.5 K/uL   Basophils Relative 1 %   Basophils Absolute 0.0 0.0 - 0.1 K/uL   Immature Granulocytes 0 %   Abs Immature Granulocytes 0.02 0.00 - 0.07 K/uL    Comment: Performed at [facility], [address] [place], [place] [address]  I-stat chem 8, ED (not at [facility], [facility] or [facility])     Status: Abnormal   Collection Time: 06/12/24  2:27 AM  Result Value Ref Range    Sodium 138 135 - 145 mmol/L   Potassium 4.1 3.5 - 5.1 mmol/L   Chloride 106 98 - 111 mmol/L   BUN 22 8 - 23 mg/dL   Creatinine, Ser 9.29 0.44 - 1.00 mg/dL   Glucose, Bld 873 (H) 70 - 99 mg/dL    Comment: Glucose reference range applies only to samples taken after fasting for at least 8 hours.   Calcium, Ion 1.20 1.15 - 1.40 mmol/L   TCO2 20 (L) 22 - 32 mmol/L   Hemoglobin 13.9 12.0 - 15.0 g/dL   HCT 58.9 63.9 - 53.9 %  CBG monitoring, ED     Status: Abnormal   Collection Time: 06/12/24  4:05 AM  Result Value Ref Range   Glucose-Capillary 138 (H) 70 - 99 mg/dL    Comment: Glucose reference range applies only to samples taken after fasting for at least 8 hours.  Basic metabolic panel     Status: Abnormal   Collection Time: 06/12/24  6:01 AM  Result Value Ref Range   Sodium 136 135 - 145 mmol/L   Potassium 3.9 3.5 - 5.1 mmol/L   Chloride 104 98 - 111 mmol/L   CO2 25 22 - 32 mmol/L   Glucose, Bld 138 (H) 70 - 99 mg/dL    Comment: Glucose reference range applies only to samples taken after fasting for at least 8 hours.   BUN 23 8 - 23 mg/dL   Creatinine, Ser 9.21 0.44 - 1.00 mg/dL   Calcium 9.1 8.9 - 89.6 mg/dL   GFR, Estimated >39 >39 mL/min    Comment: (NOTE) Calculated using the CKD-EPI Creatinine Equation (2021)    Anion gap 7 5 - 15    Comment: Performed at [facility], [address], [place], [place] [address]  CBC     Status: None   Collection Time: 06/12/24  6:01 AM  Result Value Ref Range   WBC 6.3 4.0 - 10.5 K/uL   RBC 4.48 3.87 - 5.11 MIL/uL   Hemoglobin 12.9 12.0 - 15.0 g/dL   HCT 60.9 63.9 - 53.9 %   MCV 87.1 80.0 - 100.0 fL   MCH 28.8 26.0 - 34.0 pg   MCHC 33.1 30.0 - 36.0 g/dL   RDW 85.7 88.4 - 84.4 %   Platelets 176 150 - 400 K/uL   nRBC 0.0 0.0 - 0.2 %    Comment: Performed at [facility], [address] [place], [place] [address]  CBG monitoring, ED     Status: Abnormal   Collection Time: 06/12/24  8:13 AM   Result Value Ref Range   Glucose-Capillary 132 (H) 70 - 99 mg/dL    Comment: Glucose reference range applies only to  samples taken after fasting for at least 8 hours.   Comment 1 Notify RN     CT Knee Right Wo Contrast Result Date: 06/12/2024 CLINICAL DATA:  Knee trauma with dislocation suspected. Fracture seen on recent radiographs. EXAM: CT OF THE RIGHT KNEE WITHOUT CONTRAST TECHNIQUE: Multidetector CT imaging of the right knee was performed according to the standard protocol. Multiplanar CT image reconstructions were also generated. RADIATION DOSE REDUCTION: This exam was performed according to the departmental dose-optimization program which includes automated exposure control, adjustment of the mA and/or kV according to patient size and/or use of iterative reconstruction technique. COMPARISON:  Right knee radiograph 06/12/2024 FINDINGS: Bones/Joint/Cartilage Comminuted fractures of the proximal tibial metaphysis with fracture lines extending to the medial tibial plateau. Transverse comminuted fractures of the proximal fibular neck without significant displacement. Small right knee effusion. Underlying degenerative changes in the right knee with medial compartment narrowing and small osteophyte formation. Chronic osteochondral defects in the medial tibial plateau. Old osteochondral defect in the anterior medial femoral condyle with loose body in-situ. No dislocation. Ligaments Suboptimally assessed by CT. Muscles and Tendons No intramuscular mass or hematoma. Soft tissues Soft tissue infiltration over the anterior and medial infrapatellar region consistent with hematoma and contusion. IMPRESSION: 1. Comminuted fractures of the proximal tibial metaphysis with extension to the medial tibial plateau. 2. Comminuted transverse fractures of the proximal fibular shaft. 3. Mild effusion. 4. Infiltration in the anterior soft tissues consistent with contusion and hematoma. 5. Underlying degenerative changes in  the medial compartment. Electronically Signed   By: Elsie Gravely M.D.   On: 06/12/2024 02:27   DG Knee Complete 4 Views Right Result Date: 06/12/2024 CLINICAL DATA:  Pain after a fall. EXAM: RIGHT KNEE - COMPLETE 4+ VIEW COMPARISON:  None Available. FINDINGS: Depressed intra-articular fracture of medial tibial plateau with fracture lines extending anterior to posterior through the metaphysis. Transverse comminuted fracture of the proximal fibular shaft. Soft tissue swelling in the upper aspect of the lower leg. No significant effusion. No dislocation at the knee joint. IMPRESSION: Comminuted and depressed intra-articular fracture of the medial tibial plateau. Mildly comminuted transverse fracture of the proximal fibular shaft. Electronically Signed   By: Elsie Gravely M.D.   On: 06/12/2024 00:33   DG Hip Unilat W or Wo Pelvis 2-3 Views Right Result Date: 06/12/2024 CLINICAL DATA:  Pain after a fall EXAM: DG HIP (WITH OR WITHOUT PELVIS) 2-3V RIGHT COMPARISON:  None Available. FINDINGS: Degenerative changes in the lower lumbar spine and in both hips. Pelvis and right hip appear intact. No acute fracture or dislocation. No focal bone lesion or bone destruction. Soft tissues are unremarkable. Tubing in the lower abdomen likely representing reservoir for gastric lap band. IMPRESSION: Degenerative changes in the right hip. No acute displaced fractures are identified. Electronically Signed   By: Elsie Gravely M.D.   On: 06/12/2024 00:32    Review of Systems  HENT:  Negative for ear discharge, ear pain, hearing loss and tinnitus.   Eyes:  Negative for photophobia and pain.  Respiratory:  Negative for cough and shortness of breath.   Cardiovascular:  Negative for chest pain.  Gastrointestinal:  Negative for abdominal pain, nausea and vomiting.  Genitourinary:  Negative for dysuria, flank pain, frequency and urgency.  Musculoskeletal:  Positive for arthralgias (Left hip). Negative for back pain,  myalgias and neck pain.  Neurological:  Negative for dizziness and headaches.  Hematological:  Does not bruise/bleed easily.  Psychiatric/Behavioral:  The patient is not nervous/anxious.  Blood pressure 131/80, pulse 73, temperature 98.2 F (36.8 C), temperature source Oral, resp. rate 18, SpO2 97%. Physical Exam Constitutional:      General: She is not in acute distress.    Appearance: She is well-developed. She is not diaphoretic.  HENT:     Head: Normocephalic and atraumatic.  Eyes:     General: No scleral icterus.       Right eye: No discharge.        Left eye: No discharge.     Conjunctiva/sclera: Conjunctivae normal.  Cardiovascular:     Rate and Rhythm: Normal rate and regular rhythm.  Pulmonary:     Effort: Pulmonary effort is normal. No respiratory distress.  Musculoskeletal:     Cervical back: Normal range of motion.     Comments: LLE No traumatic wounds, ecchymosis, or rash  Mod TTP hip  No knee or ankle effusion  Knee stable to varus/ valgus and anterior/posterior stress  Sens DPN, SPN, TN intact  Motor EHL, ext, flex, evers 5/5  DP 1+, PT 0, 1+ NP edema  Skin:    General: Skin is warm and dry.  Neurological:     Mental Status: She is alert.  Psychiatric:        Mood and Affect: Mood normal.        Behavior: Behavior normal.     Assessment/Plan: Left hip fx -- Plan IMN today with Dr. Kendal. Please keep NPO. Multiple medical problems including hypertension, type 2 diabetes mellitus, nephrolithiasis, lupus in remission, depression, and anxiety -- per primary service    Ozell DOROTHA Ned, PA-C Orthopedic Surgery [phone] 06/12/2024, 9:05 AM

## 2024-06-12 NOTE — Discharge Instructions (Signed)
 Orthopaedic Trauma Service Discharge Instructions   General Discharge Instructions  WEIGHT BEARING STATUS:Non-weightbearing right lower extremity  RANGE OF MOTION/ACTIVITY: OK for knee motion as tolerated  Wound Care: You may remove your surgical dressing on post op day 2 (Sunday 06/14/24). Incisions can be left open to air if there is no drainage. Once the incision is completely dry and without drainage, it may be left open to air out.  Showering may begin post-op day 3 (Monday 06/15/24).  Clean incision gently with soap and water.  DVT/PE prophylaxis: Aspirin  325 mg daily x 30 days  Diet: as you were eating previously.  Can use over the counter stool softeners and bowel preparations, such as Miralax , to help with bowel movements.  Narcotics can be constipating.  Be sure to drink plenty of fluids  PAIN MEDICATION USE AND EXPECTATIONS  You have likely been given narcotic medications to help control your pain.  After a traumatic event that results in an fracture (broken bone) with or without surgery, it is ok to use narcotic pain medications to help control one's pain.  We understand that everyone responds to pain differently and each individual patient will be evaluated on a regular basis for the continued need for narcotic medications. Ideally, narcotic medication use should last no more than 6-8 weeks (coinciding with fracture healing).   As a patient it is your responsibility as well to monitor narcotic medication use and report the amount and frequency you use these medications when you come to your office visit.   We would also advise that if you are using narcotic medications, you should take a dose prior to therapy to maximize you participation.  IF YOU ARE ON NARCOTIC MEDICATIONS IT IS NOT PERMISSIBLE TO OPERATE A MOTOR VEHICLE (MOTORCYCLE/CAR/TRUCK/MOPED) OR HEAVY MACHINERY DO NOT MIX NARCOTICS WITH OTHER CNS (CENTRAL NERVOUS SYSTEM) DEPRESSANTS SUCH AS ALCOHOL  POST-OPERATIVE  OPIOID TAPER INSTRUCTIONS: It is important to wean off of your opioid medication as soon as possible. If you do not need pain medication after your surgery it is ok to stop day one. Opioids include: Codeine, Hydrocodone (Norco, Vicodin), Oxycodone (Percocet, oxycontin ) and hydromorphone  amongst others.  Long term and even short term use of opiods can cause: Increased pain response Dependence Constipation Depression Respiratory depression And more.  Withdrawal symptoms can include Flu like symptoms Nausea, vomiting And more Techniques to manage these symptoms Hydrate well Eat regular healthy meals Stay active Use relaxation techniques(deep breathing, meditating, yoga) Do Not substitute Alcohol to help with tapering If you have been on opioids for less than two weeks and do not have pain than it is ok to stop all together.  Plan to wean off of opioids This plan should start within one week post op of your fracture surgery  Maintain the same interval or time between taking each dose and first decrease the dose.  Cut the total daily intake of opioids by one tablet each day Next start to increase the time between doses. The last dose that should be eliminated is the evening dose.    STOP SMOKING OR USING NICOTINE PRODUCTS!!!!  As discussed nicotine severely impairs your body's ability to heal surgical and traumatic wounds but also impairs bone healing.  Wounds and bone heal by forming microscopic blood vessels (angiogenesis) and nicotine is a vasoconstrictor (essentially, shrinks blood vessels).  Therefore, if vasoconstriction occurs to these microscopic blood vessels they essentially disappear and are unable to deliver necessary nutrients to the healing tissue.  This is one modifiable  factor that you can do to dramatically increase your chances of healing your injury.  (This means no smoking, no nicotine gum, patches, etc)  DO NOT USE NONSTEROIDAL ANTI-INFLAMMATORY DRUGS (NSAID'S)  Using  products such as Advil  (ibuprofen ), Aleve (naproxen), Motrin  (ibuprofen ) for additional pain control during fracture healing can delay and/or prevent the healing response.  If you would like to take over the counter (OTC) medication, Tylenol  (acetaminophen ) is ok.  However, some narcotic medications that are given for pain control contain acetaminophen  as well. Therefore, you should not exceed more than 4000 mg of tylenol  in a day if you do not have liver disease.  Also note that there are may OTC medicines, such as cold medicines and allergy medicines that my contain tylenol  as well.  If you have any questions about medications and/or interactions please ask your doctor/PA or your pharmacist.      ICE AND ELEVATE INJURED/OPERATIVE EXTREMITY  Using ice and elevating the injured extremity above your heart can help with swelling and pain control.  Icing in a pulsatile fashion, such as 20 minutes on and 20 minutes off, can be followed.    Do not place ice directly on skin. Make sure there is a barrier between to skin and the ice pack.    Using frozen items such as frozen peas works well as the conform nicely to the are that needs to be iced.  USE AN ACE WRAP OR TED HOSE FOR SWELLING CONTROL  In addition to icing and elevation, Ace wraps or TED hose are used to help limit and resolve swelling.  It is recommended to use Ace wraps or TED hose until you are informed to stop.    When using Ace Wraps start the wrapping distally (farthest away from the body) and wrap proximally (closer to the body)   Example: If you had surgery on your leg or thing and you do not have a splint on, start the ace wrap at the toes and work your way up to the thigh        If you had surgery on your upper extremity and do not have a splint on, start the ace wrap at your fingers and work your way up to the upper arm   CALL THE OFFICE FOR MEDICATION REFILLS OR WITH ANY QUESTIONS/CONCERNS: [phone]   VISIT OUR WEBSITE FOR  ADDITIONAL INFORMATION: [email or web address]   Discharge Wound Care Instructions  Do NOT apply any ointments, solutions or lotions to pin sites or surgical wounds.  These prevent needed drainage and even though solutions like hydrogen peroxide kill bacteria, they also damage cells lining the pin sites that help fight infection.  Applying lotions or ointments can keep the wounds moist and can cause them to breakdown and open up as well. This can increase the risk for infection. When in doubt call the office.  Surgical incisions should be dressed daily.  If any drainage is noted, use one layer of adaptic or Mepitel, then gauze, Kerlix, and an ace wrap. - These dressing supplies should be available at local medical supply stores ([facility], [facility], etc) as well as Insurance claims handler ([facility], [facility], [place], etc)  Once the incision is completely dry and without drainage, it may be left open to air out.  Showering may begin 36-48 hours later.  Cleaning gently with soap and water.   Call office for the following: Temperature greater than 101F Persistent nausea and vomiting Severe uncontrolled pain Redness, tenderness, or signs of infection (pain,  swelling, redness, odor or green/yellow discharge around the site) Difficulty breathing, headache or visual disturbances Hives Persistent dizziness or light-headedness Extreme fatigue Any other questions or concerns you may have after discharge  In an emergency, call 911 or go to an Emergency Department at a nearby hospital  OTHER HELPFUL INFORMATION  If you had a block, it will wear off between 8-24 hrs postop typically.  This is period when your pain may go from nearly zero to the pain you would have had postop without the block.  This is an abrupt transition but nothing dangerous is happening.  You may take an extra dose of narcotic when this happens.  You should wean off your narcotic medicines as soon as you are able.  Most patients  will be off or using minimal narcotics before their first postop appointment.   We suggest you use the pain medication the first night prior to going to bed, in order to ease any pain when the anesthesia wears off. You should avoid taking pain medications on an empty stomach as it will make you nauseous.  Do not drink alcoholic beverages or take illicit drugs when taking pain medications.  In most states it is against the law to drive while you are in a splint or sling.  And certainly against the law to drive while taking narcotics.  You may return to work/school in the next couple of days when you feel up to it.   Pain medication may make you constipated.  Below are a few solutions to try in this order: Decrease the amount of pain medication if you aren't having pain. Drink lots of decaffeinated fluids. Drink prune juice and/or each dried prunes  If the first 3 don't work start with additional solutions Take Colace - an over-the-counter stool softener Take Senokot - an over-the-counter laxative Take Miralax  - a stronger over-the-counter laxative

## 2024-06-12 NOTE — ED Provider Notes (Signed)
 [place] EMERGENCY DEPARTMENT AT [facility] Provider Note   CSN: [id number] Arrival date & time: 06/11/24  2333     Patient presents with: Shirley Sullivan is a 63 y.o. female.   The history is provided by the patient and the spouse.  Fall This is a new problem. The current episode started 6 to 12 hours ago. The problem occurs rarely. The problem has been resolved. Pertinent negatives include no chest pain, no abdominal pain, no headaches and no shortness of breath. Nothing aggravates the symptoms. Nothing relieves the symptoms. She has tried nothing for the symptoms. The treatment provided no relief.  Patient with Diabetes and Lupus who fell going up porch steps.  Did not hit head, no LOC.  Immediate R knee pain and swelling.  Needed help getting up.      Past Medical History:  Diagnosis Date   Allergy    Anxiety    Arthritis    Asthma    perfumes triggers attacks. Has inhalers for rescue   COVID-19 10/09/2020   and 08/15/20   Depression    Diabetes mellitus without complication (HCC)    History of kidney stones 1998, 2015   Lupus    Septic shock (HCC)    Klebsiella oxytocin and Enterobacter cloacae bacteremia/UTI     Prior to Admission medications   Medication Sig Start Date End Date Taking? Authorizing Provider  ALPRAZolam  (XANAX ) 0.5 MG tablet Take 0.5 tablets (0.25 mg total) by mouth 2 (two) times daily as needed for anxiety. 10/16/20   Mikhail, Maryann, DO  cetirizine (ZYRTEC) 10 MG tablet Take 10 mg by mouth at bedtime.     [provider]  ciprofloxacin  (CIPRO ) 500 MG tablet Take 500 mg by mouth 2 (two) times daily. Patient not taking: Reported on 02/07/2024    [provider]  dicyclomine (BENTYL) 10 MG capsule Take 10 mg by mouth 4 (four) times daily as needed (abdominal spasm.).  06/23/20   [provider]  gabapentin  (NEURONTIN ) 300 MG capsule Take 400 mg by mouth 3 (three) times daily. 09/23/20   [provider]  gabapentin  (NEURONTIN ) 400 MG capsule Gabapentin  400 mg three times per day, can take an extra 400 mg at bedtime if needed. 04/13/24   Leigh Venetia CROME, MD  losartan  (COZAAR ) 25 MG tablet Take 25 mg by mouth daily.    [provider]  metFORMIN (GLUCOPHAGE) 1000 MG tablet Take 1,000 mg by mouth 2 (two) times daily with a meal. 09/19/20   [provider]  mupirocin ointment (BACTROBAN) 2 % Apply 1 application topically 2 (two) times daily as needed (wound care (hidradenitis suppurativa)).  06/23/20   [provider]  ondansetron  (ZOFRAN  ODT) 4 MG disintegrating tablet 4mg  ODT q4 hours prn nausea/vomit Patient taking differently: Take 4 mg by mouth every 4 (four) hours as needed for nausea or vomiting. 10/16/20   Mikhail, Maryann, DO  rosuvastatin (CRESTOR) 20 MG tablet Take 20 mg by mouth at bedtime. Patient not taking: Reported on 02/07/2024 09/23/20   [provider]  sertraline  (ZOLOFT ) 100 MG tablet Take 100 mg by mouth at bedtime.    [provider]  traMADol  (ULTRAM ) 50 MG tablet Take 1 tablet (50 mg total) by mouth every 6 (six) hours as needed for moderate pain. Patient not taking: Reported on 10/31/2020 10/16/20   Mikhail, Maryann, DO    Allergies: Codeine, Strawberry extract, and Vibramycin [doxycycline calcium]    Review of Systems  Constitutional:  Negative for fever.  Respiratory:  Negative for shortness of breath.   Cardiovascular:  Negative for chest pain.  Gastrointestinal:  Negative for abdominal pain.  Musculoskeletal:  Positive for arthralgias.  Neurological:  Negative for headaches.  All other systems reviewed and are negative.   Updated Vital Signs Pulse 80   Temp 98.8 F (37.1 C) (Oral)   SpO2 100%   Physical Exam Vitals and nursing note reviewed.  Constitutional:      General: She is not in acute distress.    Appearance: She is well-developed.  HENT:     Head: Normocephalic and atraumatic.     Nose: Nose normal.   Eyes:     Pupils: Pupils are equal, round, and reactive to light.  Cardiovascular:     Rate and Rhythm: Normal rate and regular rhythm.     Pulses: Normal pulses.     Heart sounds: Normal heart sounds.  Pulmonary:     Effort: Pulmonary effort is normal. No respiratory distress.     Breath sounds: Normal breath sounds.  Abdominal:     General: Bowel sounds are normal. There is no distension.     Palpations: Abdomen is soft.     Tenderness: There is no abdominal tenderness. There is no guarding or rebound.  Musculoskeletal:        General: Swelling present. Normal range of motion.     Cervical back: Neck supple.     Right knee: Swelling and bony tenderness present. Tenderness present.  Skin:    General: Skin is warm and dry.     Capillary Refill: Capillary refill takes less than 2 seconds.     Findings: No erythema or rash.  Neurological:     General: No focal deficit present.     Deep Tendon Reflexes: Reflexes normal.  Psychiatric:        Mood and Affect: Mood normal.     (all labs ordered are listed, but only abnormal results are displayed) Results for orders placed or performed during the hospital encounter of 06/11/24  CBC with Differential   Collection Time: 06/12/24  2:15 AM  Result Value Ref Range   WBC 7.6 4.0 - 10.5 K/uL   RBC 4.76 3.87 - 5.11 MIL/uL   Hemoglobin 13.6 12.0 - 15.0 g/dL   HCT 59.1 63.9 - 53.9 %   MCV 85.7 80.0 - 100.0 fL   MCH 28.6 26.0 - 34.0 pg   MCHC 33.3 30.0 - 36.0 g/dL   RDW 85.7 88.4 - 84.4 %   Platelets 186 150 - 400 K/uL   nRBC 0.0 0.0 - 0.2 %   Neutrophils Relative % 61 %   Neutro Abs 4.7 1.7 - 7.7 K/uL   Lymphocytes Relative 29 %   Lymphs Abs 2.2 0.7 - 4.0 K/uL   Monocytes Relative 8 %   Monocytes Absolute 0.6 0.1 - 1.0 K/uL   Eosinophils Relative 1 %   Eosinophils Absolute 0.1 0.0 - 0.5 K/uL   Basophils Relative 1 %   Basophils Absolute 0.0 0.0 - 0.1 K/uL   Immature Granulocytes 0 %   Abs Immature Granulocytes 0.02 0.00 - 0.07  K/uL  I-stat chem 8, ED (not at [facility], [facility] or [facility])   Collection Time: 06/12/24  2:27 AM  Result Value Ref Range   Sodium 138 135 - 145 mmol/L   Potassium 4.1 3.5 - 5.1 mmol/L   Chloride 106 98 - 111 mmol/L   BUN 22 8 - 23 mg/dL  Creatinine, Ser 0.70 0.44 - 1.00 mg/dL   Glucose, Bld 873 (H) 70 - 99 mg/dL   Calcium, Ion 8.79 1.15 - 1.40 mmol/L   TCO2 20 (L) 22 - 32 mmol/L   Hemoglobin 13.9 12.0 - 15.0 g/dL   HCT 58.9 63.9 - 53.9 %   CT Knee Right Wo Contrast Result Date: 06/12/2024 CLINICAL DATA:  Knee trauma with dislocation suspected. Fracture seen on recent radiographs. EXAM: CT OF THE RIGHT KNEE WITHOUT CONTRAST TECHNIQUE: Multidetector CT imaging of the right knee was performed according to the standard protocol. Multiplanar CT image reconstructions were also generated. RADIATION DOSE REDUCTION: This exam was performed according to the departmental dose-optimization program which includes automated exposure control, adjustment of the mA and/or kV according to patient size and/or use of iterative reconstruction technique. COMPARISON:  Right knee radiograph 06/12/2024 FINDINGS: Bones/Joint/Cartilage Comminuted fractures of the proximal tibial metaphysis with fracture lines extending to the medial tibial plateau. Transverse comminuted fractures of the proximal fibular neck without significant displacement. Small right knee effusion. Underlying degenerative changes in the right knee with medial compartment narrowing and small osteophyte formation. Chronic osteochondral defects in the medial tibial plateau. Old osteochondral defect in the anterior medial femoral condyle with loose body in-situ. No dislocation. Ligaments Suboptimally assessed by CT. Muscles and Tendons No intramuscular mass or hematoma. Soft tissues Soft tissue infiltration over the anterior and medial infrapatellar region consistent with hematoma and contusion. IMPRESSION: 1. Comminuted fractures of the proximal tibial metaphysis with  extension to the medial tibial plateau. 2. Comminuted transverse fractures of the proximal fibular shaft. 3. Mild effusion. 4. Infiltration in the anterior soft tissues consistent with contusion and hematoma. 5. Underlying degenerative changes in the medial compartment. Electronically Signed   By: Elsie Gravely M.D.   On: 06/12/2024 02:27   DG Knee Complete 4 Views Right Result Date: 06/12/2024 CLINICAL DATA:  Pain after a fall. EXAM: RIGHT KNEE - COMPLETE 4+ VIEW COMPARISON:  None Available. FINDINGS: Depressed intra-articular fracture of medial tibial plateau with fracture lines extending anterior to posterior through the metaphysis. Transverse comminuted fracture of the proximal fibular shaft. Soft tissue swelling in the upper aspect of the lower leg. No significant effusion. No dislocation at the knee joint. IMPRESSION: Comminuted and depressed intra-articular fracture of the medial tibial plateau. Mildly comminuted transverse fracture of the proximal fibular shaft. Electronically Signed   By: Elsie Gravely M.D.   On: 06/12/2024 00:33   DG Hip Unilat W or Wo Pelvis 2-3 Views Right Result Date: 06/12/2024 CLINICAL DATA:  Pain after a fall EXAM: DG HIP (WITH OR WITHOUT PELVIS) 2-3V RIGHT COMPARISON:  None Available. FINDINGS: Degenerative changes in the lower lumbar spine and in both hips. Pelvis and right hip appear intact. No acute fracture or dislocation. No focal bone lesion or bone destruction. Soft tissues are unremarkable. Tubing in the lower abdomen likely representing reservoir for gastric lap band. IMPRESSION: Degenerative changes in the right hip. No acute displaced fractures are identified. Electronically Signed   By: Elsie Gravely M.D.   On: 06/12/2024 00:32  '  Radiology: CT Knee Right Wo Contrast Result Date: 06/12/2024 CLINICAL DATA:  Knee trauma with dislocation suspected. Fracture seen on recent radiographs. EXAM: CT OF THE RIGHT KNEE WITHOUT CONTRAST TECHNIQUE: Multidetector  CT imaging of the right knee was performed according to the standard protocol. Multiplanar CT image reconstructions were also generated. RADIATION DOSE REDUCTION: This exam was performed according to the departmental dose-optimization program which includes automated exposure control, adjustment  of the mA and/or kV according to patient size and/or use of iterative reconstruction technique. COMPARISON:  Right knee radiograph 06/12/2024 FINDINGS: Bones/Joint/Cartilage Comminuted fractures of the proximal tibial metaphysis with fracture lines extending to the medial tibial plateau. Transverse comminuted fractures of the proximal fibular neck without significant displacement. Small right knee effusion. Underlying degenerative changes in the right knee with medial compartment narrowing and small osteophyte formation. Chronic osteochondral defects in the medial tibial plateau. Old osteochondral defect in the anterior medial femoral condyle with loose body in-situ. No dislocation. Ligaments Suboptimally assessed by CT. Muscles and Tendons No intramuscular mass or hematoma. Soft tissues Soft tissue infiltration over the anterior and medial infrapatellar region consistent with hematoma and contusion. IMPRESSION: 1. Comminuted fractures of the proximal tibial metaphysis with extension to the medial tibial plateau. 2. Comminuted transverse fractures of the proximal fibular shaft. 3. Mild effusion. 4. Infiltration in the anterior soft tissues consistent with contusion and hematoma. 5. Underlying degenerative changes in the medial compartment. Electronically Signed   By: Elsie Gravely M.D.   On: 06/12/2024 02:27   DG Knee Complete 4 Views Right Result Date: 06/12/2024 CLINICAL DATA:  Pain after a fall. EXAM: RIGHT KNEE - COMPLETE 4+ VIEW COMPARISON:  None Available. FINDINGS: Depressed intra-articular fracture of medial tibial plateau with fracture lines extending anterior to posterior through the metaphysis. Transverse  comminuted fracture of the proximal fibular shaft. Soft tissue swelling in the upper aspect of the lower leg. No significant effusion. No dislocation at the knee joint. IMPRESSION: Comminuted and depressed intra-articular fracture of the medial tibial plateau. Mildly comminuted transverse fracture of the proximal fibular shaft. Electronically Signed   By: Elsie Gravely M.D.   On: 06/12/2024 00:33   DG Hip Unilat W or Wo Pelvis 2-3 Views Right Result Date: 06/12/2024 CLINICAL DATA:  Pain after a fall EXAM: DG HIP (WITH OR WITHOUT PELVIS) 2-3V RIGHT COMPARISON:  None Available. FINDINGS: Degenerative changes in the lower lumbar spine and in both hips. Pelvis and right hip appear intact. No acute fracture or dislocation. No focal bone lesion or bone destruction. Soft tissues are unremarkable. Tubing in the lower abdomen likely representing reservoir for gastric lap band. IMPRESSION: Degenerative changes in the right hip. No acute displaced fractures are identified. Electronically Signed   By: Elsie Gravely M.D.   On: 06/12/2024 00:32     Procedures   Medications Ordered in the ED  fentaNYL  (SUBLIMAZE ) injection 50 mcg (50 mcg Intravenous Given 06/12/24 0243)  ondansetron  (ZOFRAN ) injection 4 mg (4 mg Intravenous Given 06/12/24 0242)                                    Medical Decision Making Fall onto the right knee   Amount and/or Complexity of Data Reviewed Independent Historian: EMS    Details: See above  External Data Reviewed: notes. Labs: ordered.    Details: Normal white count 7.6, normal hemoglobin 13.6, normal platelets.  Normal sodium 138, normal potassium 4.1, normal creatinine 0.7  Radiology: ordered and independent interpretation performed.    Details: Tibial plateau and proximal fibula fracture by me on CT Discussion of management or test interpretation with external provider(s): Case d/w Dr. Burnetta of orthopedics, please obtain a CT of the knee for planning and admit to  [facility]    Case d/w Dr. Charlton who will admit   Risk Prescription drug management. Decision regarding hospitalization.  Final diagnoses:  Fall, initial encounter  Closed fracture of right tibial plateau, initial encounter  Closed displaced comminuted fracture proximal right fibula, initial encounter   The patient appears reasonably stabilized for admission considering the current resources, flow, and capabilities available in the ED at this time, and I doubt any other [facility] requiring further screening and/or treatment in the ED prior to admission.  ED Discharge Orders     None          Meryn Sarracino, MD 06/12/24 [phone]

## 2024-06-12 NOTE — Op Note (Signed)
 Orthopaedic Surgery Operative Note (CSN: [id number] ) Date of Surgery: 06/12/2024  Admit Date: 06/11/2024   Diagnoses: Pre-Op Diagnoses: Right proximal tibia fracture  Post-Op Diagnosis: Same  Procedures: CPT 27536-Open reduction internal fixation of right proximal tibia fracture  Surgeons : Primary: Kendal Franky SQUIBB, MD  Assistant: Lauraine Moores, PA-C  Location: OR 4   Anesthesia: General   Antibiotics: Ancef  2g preop with 1 gm vancomycin  powder placed topically   Tourniquet time: none    Estimated Blood Loss: 50 mL  Complications:* No complications entered in OR log *   Specimens:* No specimens in log *   Implants: Implant Name Type Inv. Item Serial No. Manufacturer Lot No. LRB No. Used Action  PLATE LOCK TIB NCB 132 5H RT - [id number] Plate PLATE LOCK TIB NCB 132 5H RT  [employer](ORTH,TRAU,BIO,SG)  Right 1 Implanted  SCREW PROX ST NCB 4X80 - [id number] Screw SCREW PROX ST NCB 4X80  [employer](ORTH,TRAU,BIO,SG)  Right 3 Implanted  SCREW NCB 4.0X40MM - [id number] Screw SCREW NCB 4.0X40MM  [employer](ORTH,TRAU,BIO,SG)  Right 1 Implanted  SCREW NCB 4.0X36MM - [id number] Screw SCREW NCB 4.0X36MM  [employer](ORTH,TRAU,BIO,SG)  Right 1 Implanted  SCREW NCB 4.0MX65M - [id number] Screw SCREW NCB 4.0MX65M  [employer](ORTH,TRAU,BIO,SG)  Right 1 Implanted  SCREW NCB 4.0MX42M - [id number] Screw SCREW NCB 4.0MX42M  [employer](ORTH,TRAU,BIO,SG)  Right 1 Implanted  CAP LOCK NCB - [id number] Cap CAP LOCK NCB  [employer](ORTH,TRAU,BIO,SG)  Right 5 Implanted     Indications for Surgery: 63 year old female who sustained a right proximal tibia fracture.  Due to the unstable nature of her injury I recommend proceeding with open reduction internal fixation.  Risks and benefits were discussed with the patient.  Risks include but not limited to bleeding, infection, malunion, nonunion, hardware failure, hardware irritation, nerve or blood vessel injury, DVT, knee stiffness, even the  possibility of anesthetic complications.  She agreed to proceed with surgery and consent was obtained.  Operative Findings: Open reduction internal fixation of right proximal tibia fracture using [employer] NCB proximal tibial locking plate.  Procedure: The patient was identified in the preoperative holding area. Consent was confirmed with the patient and their family and all questions were answered. The operative extremity was marked after confirmation with the patient. she was then brought back to the operating room by our anesthesia colleagues.  She was placed under general anesthetic and carefully transferred over to radiolucent flattop table.  A bump was placed under her operative hip.  The right lower extremity was then prepped and draped in usual sterile fashion.  A timeout was performed to verify the patient, the procedure, and the extremity.  Preoperative antibiotics were dosed.  Fluoroscopic imaging was obtained to show the unstable nature of her injury.  Her leg was placed on a bone foam and the incision was marked out for minimally invasive approach.  Carried it down through skin and subcutaneous tissue.  I incised through the IT band and proceeded to mobilize the IT band off of the proximal tibia.  I then developed the path between the anterior tibialis musculature and the lateral cortex of the tibia.  I attached a 5 hole [employer] NCB proximal tibial locking plate to a targeting arm and slid this submuscularly along the lateral cortex of the tibia.  I held provisionally proximally with a 2.0 mm K wire.  I then percutaneously placed a 3.3 mm drill bit to align the distal portion of the plate.  I then drilled and  placed a 4.0 millimeter screw proximally to bring the plate flush to bone.  Another screw was placed just posterior to that 1.  I then percutaneously placed 4.0 millimeter screws in the shaft of the tibia.  The locking cap was placed on the middle screw.  The targeting arm was  removed and a total of 4 screws were placed proximally.  Locking caps were placed on all the proximal screws.  Final fluoroscopic imaging was obtained.  The incision was copiously irrigated.  A gram vancomycin  powder was placed into the incision.  A layered closure of 0 Vicryl, 2-0 Monocryl and 3-0 nylon was used to close the skin.  Sterile dressing was applied.  The patient was then awoke from anesthesia and taken to the PACU in stable condition.  Post Op Plan/Instructions: The patient will be touchdown weightbearing to the right lower extremity.  She will receive postoperative Ancef .  She will be placed on Lovenox  for DVT prophylaxis and discharged on aspirin .  Will have her mobilize with physical and Occupational Therapy.  I was present and performed the entire surgery.  Lauraine Moores, PA-C did assist me throughout the case. An assistant was necessary given the difficulty in approach, maintenance of reduction and ability to instrument the fracture.   Franky Light, MD [facility]

## 2024-06-12 NOTE — Consult Note (Signed)
 ORTHOPAEDIC CONSULTATION  REQUESTING PHYSICIAN: Danford, Lonni SQUIBB, *  PCP:  Montey Presto, MD  Chief Complaint: Right lower extremity pain after a fall  HPI: Shirley Sullivan is a 63 y.o. female who complains of  right lower leg pain. Presents to the ER today for right leg pain after a fall.  She has a past medical history significant for type 2 diabetes, lupus, long COVID, nephrolithiasis.  She states that at 4 PM on Thursday, June 11, 2024 while she was leaving her house she fell on her front porch.  She states that her front porch is all concrete.  She states that she had a prior fall about 6 weeks ago where the same thing happened however all she sustained was a concussion.  She denies any head trauma.  She denies any loss of consciousness.  She states that her husband and 3 others were able to assist her getting back up and into the house.  She states that she waited a few hours and then she then made the trip to the emergency room.  She states that her pain is the worst pain that she is ever felt.  She rates her pain a 9 out of 10 on pain scale.  She states that a majority of the pain is in the right lower extremity.  She states that it is constant in duration.  She describes it as a sharp pain.  She denies any alleviating factors.  She states range of motion aggravates her pain.  She states that the pain medications do help somewhat with the pain but they do not last very long.  She denies any radiation of her pain.  She denies any associated symptoms such as fever, chills, shortness of breath, chest pain, paresthesias and pallor of the lower extremity.  Past Medical History:  Diagnosis Date   Allergy    Anxiety    Arthritis    Asthma    perfumes triggers attacks. Has inhalers for rescue   COVID-19 10/09/2020   and 08/15/20   Depression    Diabetes mellitus without complication (HCC)    History of kidney stones 1998, 2015   Lupus    Septic shock (HCC)    Klebsiella  oxytocin and Enterobacter cloacae bacteremia/UTI   Past Surgical History:  Procedure Laterality Date   ABDOMINAL HYSTERECTOMY  2004   APPENDECTOMY  1992   CYSTOSCOPY WITH RETROGRADE PYELOGRAM, URETEROSCOPY AND STENT PLACEMENT Left 08/11/2014   Procedure: CYSTOSCOPY WITH RETROGRADE PYELOGRAM, URETEROSCOPY AND STENT PLACEMENT,  DIGITAL FLEXIBLE URETEROSCOPE;  Surgeon: Gretel Ferrara, MD;  Location: [facility];  Service: Urology;  Laterality: Left;  request digital flexible ureteroscope   CYSTOSCOPY WITH RETROGRADE PYELOGRAM, URETEROSCOPY AND STENT PLACEMENT Left 08/23/2014   Procedure: CYSTOSCOPY WITH RETROGRADE PYELOGRAM, URETEROSCOPY AND STENT EXCHANGE;  Surgeon: Gretel Ferrara, MD;  Location: [facility];  Service: Urology;  Laterality: Left;   CYSTOSCOPY/URETEROSCOPY/HOLMIUM LASER/STENT PLACEMENT Right 11/02/2020   Procedure: CYSTOSCOPY/URETEROSCOPY/HOLMIUM LASER/STENT PLACEMENT/ REMOVAL OF RIGHT NEPHROSTOMY TUBE;  Surgeon: Ferrara Gretel, MD;  Location: [facility];  Service: Urology;  Laterality: Right;  patient will not need to be covid tested, tested positive on 11/14/20 has proof   HOLMIUM LASER APPLICATION Left 08/23/2014   Procedure: HOLMIUM LASER APPLICATION;  Surgeon: Gretel Ferrara, MD;  Location: [facility];  Service: Urology;  Laterality: Left;   IR NEPHROSTOMY EXCHANGE RIGHT  10/17/2020   IR NEPHROSTOMY PLACEMENT RIGHT  10/10/2020   ROTATOR CUFF REPAIR Right 2007   Social History  Socioeconomic History   Marital status: Married    Spouse name: Not on file   Number of children: Not on file   Years of education: Not on file   Highest education level: Not on file  Occupational History   Not on file  Tobacco Use   Smoking status: Never   Smokeless tobacco: Never  Vaping Use   Vaping status: Never Used  Substance and Sexual Activity   Alcohol use: No    Alcohol/week: 0.0 standard drinks of alcohol   Drug use: No   Sexual activity: Yes    Birth control/protection: Surgical  Other Topics Concern    Not on file  Social History Narrative   Are you right handed or left handed? Right   Are you currently employed ? no   What is your current occupation? no   Do you live at home alone?   Who lives with you? family   What type of home do you live in: 1 story or 2 story? one   Caffiene 16 oz a daily    Social Drivers of Corporate investment banker Strain: Not on file  Food Insecurity: Not on file  Transportation Needs: Not on file  Physical Activity: Not on file  Stress: Not on file  Social Connections: Unknown (04/17/2022)   Received [facility]   Social Network    Social Network: Not on file   Family History  Problem Relation Age of Onset   Hyperlipidemia Mother    Allergies  Allergen Reactions   Codeine Itching   Strawberry Extract Hives, Itching and Other (See Comments)    Can't breathe   Vibramycin [Doxycycline Calcium] Nausea And Vomiting   Prior to Admission medications   Medication Sig Start Date End Date Taking? Authorizing Provider  alendronate (FOSAMAX) 70 MG tablet Take 70 mg by mouth once a week.   Yes [provider]  ALPRAZolam  (XANAX ) 0.5 MG tablet Take 0.5 tablets (0.25 mg total) by mouth 2 (two) times daily as needed for anxiety. 10/16/20  Yes Mikhail, Maryann, DO  cetirizine (ZYRTEC) 10 MG tablet Take 10 mg by mouth at bedtime.    Yes [provider]  dicyclomine (BENTYL) 10 MG capsule Take 10 mg by mouth 4 (four) times daily as needed (abdominal spasm.).  06/23/20  Yes [provider]  losartan  (COZAAR ) 25 MG tablet Take 25 mg by mouth daily.   Yes [provider]  metFORMIN (GLUCOPHAGE) 1000 MG tablet Take 1,000 mg by mouth 2 (two) times daily with a meal. 09/19/20  Yes [provider]  mupirocin ointment (BACTROBAN) 2 % Apply 1 application topically 2 (two) times daily as needed (wound care (hidradenitis suppurativa)).  06/23/20  Yes [provider]  ondansetron  (ZOFRAN  ODT) 4 MG disintegrating  tablet 4mg  ODT q4 hours prn nausea/vomit Patient taking differently: Take 4 mg by mouth every 4 (four) hours as needed for nausea or vomiting. 10/16/20  Yes Mikhail, Maryann, DO  sertraline  (ZOLOFT ) 100 MG tablet Take 100 mg by mouth at bedtime.   Yes [provider]  gabapentin  (NEURONTIN ) 400 MG capsule Gabapentin  400 mg three times per day, can take an extra 400 mg at bedtime if needed. 04/13/24   Leigh Venetia CROME, MD  rosuvastatin (CRESTOR) 20 MG tablet Take 20 mg by mouth at bedtime. Patient not taking: Reported on 02/07/2024 09/23/20   [provider]   CT Knee Right Wo Contrast Result Date: 06/12/2024 CLINICAL DATA:  Knee trauma with  dislocation suspected. Fracture seen on recent radiographs. EXAM: CT OF THE RIGHT KNEE WITHOUT CONTRAST TECHNIQUE: Multidetector CT imaging of the right knee was performed according to the standard protocol. Multiplanar CT image reconstructions were also generated. RADIATION DOSE REDUCTION: This exam was performed according to the departmental dose-optimization program which includes automated exposure control, adjustment of the mA and/or kV according to patient size and/or use of iterative reconstruction technique. COMPARISON:  Right knee radiograph 06/12/2024 FINDINGS: Bones/Joint/Cartilage Comminuted fractures of the proximal tibial metaphysis with fracture lines extending to the medial tibial plateau. Transverse comminuted fractures of the proximal fibular neck without significant displacement. Small right knee effusion. Underlying degenerative changes in the right knee with medial compartment narrowing and small osteophyte formation. Chronic osteochondral defects in the medial tibial plateau. Old osteochondral defect in the anterior medial femoral condyle with loose body in-situ. No dislocation. Ligaments Suboptimally assessed by CT. Muscles and Tendons No intramuscular mass or hematoma. Soft tissues Soft tissue infiltration over the anterior and medial  infrapatellar region consistent with hematoma and contusion. IMPRESSION: 1. Comminuted fractures of the proximal tibial metaphysis with extension to the medial tibial plateau. 2. Comminuted transverse fractures of the proximal fibular shaft. 3. Mild effusion. 4. Infiltration in the anterior soft tissues consistent with contusion and hematoma. 5. Underlying degenerative changes in the medial compartment. Electronically Signed   By: Elsie Gravely M.D.   On: 06/12/2024 02:27   DG Knee Complete 4 Views Right Result Date: 06/12/2024 CLINICAL DATA:  Pain after a fall. EXAM: RIGHT KNEE - COMPLETE 4+ VIEW COMPARISON:  None Available. FINDINGS: Depressed intra-articular fracture of medial tibial plateau with fracture lines extending anterior to posterior through the metaphysis. Transverse comminuted fracture of the proximal fibular shaft. Soft tissue swelling in the upper aspect of the lower leg. No significant effusion. No dislocation at the knee joint. IMPRESSION: Comminuted and depressed intra-articular fracture of the medial tibial plateau. Mildly comminuted transverse fracture of the proximal fibular shaft. Electronically Signed   By: Elsie Gravely M.D.   On: 06/12/2024 00:33   DG Hip Unilat W or Wo Pelvis 2-3 Views Right Result Date: 06/12/2024 CLINICAL DATA:  Pain after a fall EXAM: DG HIP (WITH OR WITHOUT PELVIS) 2-3V RIGHT COMPARISON:  None Available. FINDINGS: Degenerative changes in the lower lumbar spine and in both hips. Pelvis and right hip appear intact. No acute fracture or dislocation. No focal bone lesion or bone destruction. Soft tissues are unremarkable. Tubing in the lower abdomen likely representing reservoir for gastric lap band. IMPRESSION: Degenerative changes in the right hip. No acute displaced fractures are identified. Electronically Signed   By: Elsie Gravely M.D.   On: 06/12/2024 00:32    Positive ROS: All other systems have been reviewed and were otherwise negative with the  exception of those mentioned in the HPI and as above.  Physical Exam: General: Alert, no acute distress Cardiovascular: No pedal edema Respiratory: No cyanosis, no use of accessory musculature GI: No organomegaly, abdomen is soft and non-tender Skin: One dime sized lesion in the area of chief complaint, states this was a scrape from when she fell Neurologic: Sensation intact distally Psychiatric: Patient is competent for consent with normal mood and affect Lymphatic: No axillary or cervical lymphadenopathy  MUSCULOSKELETAL: Upon inspection of the right lower extremity there is an obvious deformity.  No pallor or visible erythema or ecchymosis noted.  She does have a small dime sized lesion on the anterior portion of the right lower extremity.  She states that  this is a scrape from when she fell.  The right lower extremity is tender to the palpation.  Unable to attempt range of motion of the right lower extremity due to severe pain.  Plantar and dorsiflexion is intact.  5/5 strength with plantarflexion and dorsiflexion.  Sensation is intact.  Patient is compliant with knee immobilizer for the right lower extremity.  Assessment: Shirley Sullivan is a very pleasant 63 year old female who fell on Thursday early afternoon and sustained a right medial tibial plateau fracture.  She is currently compliant with the knee immobilizer.  She is currently NPO.  Positive void, positive flatus.  Positive bowel movement.  Sensation is intact distally bilaterally.  Positive for pain of the right lower extremity, negative for pallor, negative for pulselessness, negative for paresthesias of the lower extremity on the right side.  Plan: -Continue n.p.o. status - Transfer to [facility] - Continue pain medical management - Continue to utilize knee immobilizing brace - Plan for surgical management of the right lower extremity with the orthopedic traumatologist Dr. Celena.    Shirley LOISE Sages, PA-C for Donaciano Sprang,  MD [phone]   06/12/2024 7:37 AM

## 2024-06-12 NOTE — Anesthesia Preprocedure Evaluation (Signed)
 Anesthesia Evaluation  Patient identified by MRN, date of birth, ID band Patient awake    Reviewed: Allergy & Precautions, H&P , NPO status , Patient's Chart, lab work & pertinent test results  Airway Mallampati: II   Neck ROM: full    Dental   Pulmonary asthma    breath sounds clear to auscultation       Cardiovascular hypertension,  Rhythm:regular Rate:Normal     Neuro/Psych  PSYCHIATRIC DISORDERS Anxiety Depression       GI/Hepatic   Endo/Other  diabetes, Type 2    Renal/GU stones     Musculoskeletal  (+) Arthritis ,    Abdominal   Peds  Hematology   Anesthesia Other Findings   Reproductive/Obstetrics                              Anesthesia Physical Anesthesia Plan  ASA: 2  Anesthesia Plan: General   Post-op Pain Management:    Induction: Intravenous  PONV Risk Score and Plan: 3 and Ondansetron , Dexamethasone , Midazolam  and Treatment may vary due to age or medical condition  Airway Management Planned: Oral ETT  Additional Equipment:   Intra-op Plan:   Post-operative Plan: Extubation in OR  Informed Consent: I have reviewed the patients History and Physical, chart, labs and discussed the procedure including the risks, benefits and alternatives for the proposed anesthesia with the patient or authorized representative who has indicated his/her understanding and acceptance.     Dental advisory given  Plan Discussed with: CRNA, Anesthesiologist and Surgeon  Anesthesia Plan Comments:         Anesthesia Quick Evaluation

## 2024-06-12 NOTE — H&P (Signed)
 History and Physical    Shirley Sullivan FMW:[id number] DOB: 04-25-1961 DOA: 06/11/2024  PCP: Montey Presto, MD   Patient coming from: Home   Chief Complaint: Right knee pain and swelling after a fall   HPI: Shirley Sullivan is a 63 y.o. female with medical history significant for hypertension, type 2 diabetes mellitus, nephrolithiasis, lupus in remission, depression, and anxiety who presents with right knee pain and swelling after a fall.  Patient reports that she was in her usual state of health and having an uneventful day when she walked outside onto some wet pavement and fell.  She was experiencing severe pain in the right knee and was unable to get up on her own.  She denies hitting her head or losing consciousness.  She is typically active and does not experience chest pain.     ED Course: Upon arrival to the ED, patient is found to be afebrile and saturating well on room air with normal HR.  Labs are most notable for normal creatinine, normal WBC, and normal hemoglobin.  CT demonstrates comminuted fracture proximal tibial metaphysis with extension into the medial tibial plateau and comminuted transverse fracture of proximal fibular shaft.  Orthopedic surgery (Dr. Burnetta) was consulted by the ED physician and recommended medical admission to [facility].  The patient was treated with fentanyl  and Zofran  and the right knee was placed in an immobilizer.  Review of Systems:  All other systems reviewed and apart from HPI, are negative.  Past Medical History:  Diagnosis Date   Allergy    Anxiety    Arthritis    Asthma    perfumes triggers attacks. Has inhalers for rescue   COVID-19 10/09/2020   and 08/15/20   Depression    Diabetes mellitus without complication (HCC)    History of kidney stones 1998, 2015   Lupus    Septic shock (HCC)    Klebsiella oxytocin and Enterobacter cloacae bacteremia/UTI    Past Surgical History:  Procedure Laterality Date   ABDOMINAL  HYSTERECTOMY  2004   APPENDECTOMY  1992   CYSTOSCOPY WITH RETROGRADE PYELOGRAM, URETEROSCOPY AND STENT PLACEMENT Left 08/11/2014   Procedure: CYSTOSCOPY WITH RETROGRADE PYELOGRAM, URETEROSCOPY AND STENT PLACEMENT,  DIGITAL FLEXIBLE URETEROSCOPE;  Surgeon: Gretel Ferrara, MD;  Location: [facility];  Service: Urology;  Laterality: Left;  request digital flexible ureteroscope   CYSTOSCOPY WITH RETROGRADE PYELOGRAM, URETEROSCOPY AND STENT PLACEMENT Left 08/23/2014   Procedure: CYSTOSCOPY WITH RETROGRADE PYELOGRAM, URETEROSCOPY AND STENT EXCHANGE;  Surgeon: Gretel Ferrara, MD;  Location: [facility];  Service: Urology;  Laterality: Left;   CYSTOSCOPY/URETEROSCOPY/HOLMIUM LASER/STENT PLACEMENT Right 11/02/2020   Procedure: CYSTOSCOPY/URETEROSCOPY/HOLMIUM LASER/STENT PLACEMENT/ REMOVAL OF RIGHT NEPHROSTOMY TUBE;  Surgeon: Ferrara Gretel, MD;  Location: [facility];  Service: Urology;  Laterality: Right;  patient will not need to be covid tested, tested positive on 11/14/20 has proof   HOLMIUM LASER APPLICATION Left 08/23/2014   Procedure: HOLMIUM LASER APPLICATION;  Surgeon: Gretel Ferrara, MD;  Location: [facility];  Service: Urology;  Laterality: Left;   IR NEPHROSTOMY EXCHANGE RIGHT  10/17/2020   IR NEPHROSTOMY PLACEMENT RIGHT  10/10/2020   ROTATOR CUFF REPAIR Right 2007    Social History:   reports that she has never smoked. She has never used smokeless tobacco. She reports that she does not drink alcohol and does not use drugs.  Allergies  Allergen Reactions   Codeine Itching   Strawberry Extract Hives, Itching and Other (See Comments)    Can't breathe   Vibramycin [Doxycycline  Calcium] Nausea And Vomiting    Family History  Problem Relation Age of Onset   Hyperlipidemia Mother      Prior to Admission medications   Medication Sig Start Date End Date Taking? Authorizing Provider  ALPRAZolam  (XANAX ) 0.5 MG tablet Take 0.5 tablets (0.25 mg total) by mouth 2 (two) times daily as needed for anxiety. 10/16/20    Mikhail, Maryann, DO  cetirizine (ZYRTEC) 10 MG tablet Take 10 mg by mouth at bedtime.     [provider]  ciprofloxacin  (CIPRO ) 500 MG tablet Take 500 mg by mouth 2 (two) times daily. Patient not taking: Reported on 02/07/2024    [provider]  dicyclomine (BENTYL) 10 MG capsule Take 10 mg by mouth 4 (four) times daily as needed (abdominal spasm.).  06/23/20   [provider]  gabapentin  (NEURONTIN ) 300 MG capsule Take 400 mg by mouth 3 (three) times daily. 09/23/20   [provider]  gabapentin  (NEURONTIN ) 400 MG capsule Gabapentin  400 mg three times per day, can take an extra 400 mg at bedtime if needed. 04/13/24   Leigh Venetia CROME, MD  losartan  (COZAAR ) 25 MG tablet Take 25 mg by mouth daily.    [provider]  metFORMIN (GLUCOPHAGE) 1000 MG tablet Take 1,000 mg by mouth 2 (two) times daily with a meal. 09/19/20   [provider]  mupirocin ointment (BACTROBAN) 2 % Apply 1 application topically 2 (two) times daily as needed (wound care (hidradenitis suppurativa)).  06/23/20   [provider]  ondansetron  (ZOFRAN  ODT) 4 MG disintegrating tablet 4mg  ODT q4 hours prn nausea/vomit Patient taking differently: Take 4 mg by mouth every 4 (four) hours as needed for nausea or vomiting. 10/16/20   Mikhail, Maryann, DO  rosuvastatin (CRESTOR) 20 MG tablet Take 20 mg by mouth at bedtime. Patient not taking: Reported on 02/07/2024 09/23/20   [provider]  sertraline  (ZOLOFT ) 100 MG tablet Take 100 mg by mouth at bedtime.    [provider]  traMADol  (ULTRAM ) 50 MG tablet Take 1 tablet (50 mg total) by mouth every 6 (six) hours as needed for moderate pain. Patient not taking: Reported on 10/31/2020 10/16/20   Jeannett Liming, DO    Physical Exam: Vitals:   06/11/24 2345  Pulse: 80  Temp: 98.8 F (37.1 C)  TempSrc: Oral  SpO2: 100%    Constitutional: NAD, calm  Eyes: PERTLA, lids and conjunctivae normal ENMT: Mucous  membranes are moist. Posterior pharynx clear of any exudate or lesions.   Neck: supple, no masses  Respiratory: no wheezing, no crackles. No accessory muscle use.  Cardiovascular: S1 & S2 heard, regular rate and rhythm. No extremity edema.   Abdomen: No tenderness, soft. Bowel sounds active.  Musculoskeletal: no clubbing / cyanosis. In right knee immobilizer, neurovascularly intact.   Skin: no significant rashes, lesions, ulcers. Warm, dry, well-perfused. Neurologic: CN 2-12 grossly intact. Moving all extremities. Alert and oriented.  Psychiatric: Pleasant. Cooperative.    Labs and Imaging on Admission: I have personally reviewed following labs and imaging studies  CBC: Recent Labs  Lab 06/12/24 0215 06/12/24 0227  WBC 7.6  --   NEUTROABS 4.7  --   HGB 13.6 13.9  HCT 40.8 41.0  MCV 85.7  --   PLT 186  --    Basic Metabolic Panel: Recent Labs  Lab 06/12/24 0227  NA 138  K 4.1  CL 106  GLUCOSE 126*  BUN 22  CREATININE 0.70   GFR: CrCl cannot be  calculated (Unknown ideal weight.). Liver Function Tests: No results for input(s): AST, ALT, ALKPHOS, BILITOT, PROT, ALBUMIN in the last 168 hours. No results for input(s): LIPASE, AMYLASE in the last 168 hours. No results for input(s): AMMONIA in the last 168 hours. Coagulation Profile: No results for input(s): INR, PROTIME in the last 168 hours. Cardiac Enzymes: No results for input(s): CKTOTAL, CKMB, CKMBINDEX, TROPONINI in the last 168 hours. BNP (last 3 results) No results for input(s): PROBNP in the last 8760 hours. HbA1C: No results for input(s): HGBA1C in the last 72 hours. CBG: No results for input(s): GLUCAP in the last 168 hours. Lipid Profile: No results for input(s): CHOL, HDL, LDLCALC, TRIG, CHOLHDL, LDLDIRECT in the last 72 hours. Thyroid Function Tests: No results for input(s): TSH, T4TOTAL, FREET4, T3FREE, THYROIDAB in the last 72 hours. Anemia  Panel: No results for input(s): VITAMINB12, FOLATE, FERRITIN, TIBC, IRON, RETICCTPCT in the last 72 hours. Urine analysis:    Component Value Date/Time   COLORURINE AMBER (A) 10/09/2020 2050   APPEARANCEUR CLOUDY (A) 10/09/2020 2050   LABSPEC 1.031 (H) 10/09/2020 2050   PHURINE 5.0 10/09/2020 2050   GLUCOSEU NEGATIVE 10/09/2020 2050   HGBUR LARGE (A) 10/09/2020 2050   BILIRUBINUR NEGATIVE 10/09/2020 2050   KETONESUR NEGATIVE 10/09/2020 2050   PROTEINUR 100 (A) 10/09/2020 2050   UROBILINOGEN 1.0 01/24/2015 0839   NITRITE POSITIVE (A) 10/09/2020 2050   LEUKOCYTESUR TRACE (A) 10/09/2020 2050   Sepsis Labs: @LABRCNTIP (procalcitonin:4,lacticidven:4) )No results found for this or any previous visit (from the past 240 hours).   Radiological Exams on Admission: CT Knee Right Wo Contrast Result Date: 06/12/2024 CLINICAL DATA:  Knee trauma with dislocation suspected. Fracture seen on recent radiographs. EXAM: CT OF THE RIGHT KNEE WITHOUT CONTRAST TECHNIQUE: Multidetector CT imaging of the right knee was performed according to the standard protocol. Multiplanar CT image reconstructions were also generated. RADIATION DOSE REDUCTION: This exam was performed according to the departmental dose-optimization program which includes automated exposure control, adjustment of the mA and/or kV according to patient size and/or use of iterative reconstruction technique. COMPARISON:  Right knee radiograph 06/12/2024 FINDINGS: Bones/Joint/Cartilage Comminuted fractures of the proximal tibial metaphysis with fracture lines extending to the medial tibial plateau. Transverse comminuted fractures of the proximal fibular neck without significant displacement. Small right knee effusion. Underlying degenerative changes in the right knee with medial compartment narrowing and small osteophyte formation. Chronic osteochondral defects in the medial tibial plateau. Old osteochondral defect in the anterior medial  femoral condyle with loose body in-situ. No dislocation. Ligaments Suboptimally assessed by CT. Muscles and Tendons No intramuscular mass or hematoma. Soft tissues Soft tissue infiltration over the anterior and medial infrapatellar region consistent with hematoma and contusion. IMPRESSION: 1. Comminuted fractures of the proximal tibial metaphysis with extension to the medial tibial plateau. 2. Comminuted transverse fractures of the proximal fibular shaft. 3. Mild effusion. 4. Infiltration in the anterior soft tissues consistent with contusion and hematoma. 5. Underlying degenerative changes in the medial compartment. Electronically Signed   By: Elsie Gravely M.D.   On: 06/12/2024 02:27   DG Knee Complete 4 Views Right Result Date: 06/12/2024 CLINICAL DATA:  Pain after a fall. EXAM: RIGHT KNEE - COMPLETE 4+ VIEW COMPARISON:  None Available. FINDINGS: Depressed intra-articular fracture of medial tibial plateau with fracture lines extending anterior to posterior through the metaphysis. Transverse comminuted fracture of the proximal fibular shaft. Soft tissue swelling in the upper aspect of the lower leg. No significant effusion. No dislocation at the knee  joint. IMPRESSION: Comminuted and depressed intra-articular fracture of the medial tibial plateau. Mildly comminuted transverse fracture of the proximal fibular shaft. Electronically Signed   By: Elsie Gravely M.D.   On: 06/12/2024 00:33   DG Hip Unilat W or Wo Pelvis 2-3 Views Right Result Date: 06/12/2024 CLINICAL DATA:  Pain after a fall EXAM: DG HIP (WITH OR WITHOUT PELVIS) 2-3V RIGHT COMPARISON:  None Available. FINDINGS: Degenerative changes in the lower lumbar spine and in both hips. Pelvis and right hip appear intact. No acute fracture or dislocation. No focal bone lesion or bone destruction. Soft tissues are unremarkable. Tubing in the lower abdomen likely representing reservoir for gastric lap band. IMPRESSION: Degenerative changes in the right  hip. No acute displaced fractures are identified. Electronically Signed   By: Elsie Gravely M.D.   On: 06/12/2024 00:32     Assessment/Plan   1. Right tibia and fibula fractures  - Surgery will consult at [facility]  - Based on the available data, Shirley Sullivan presents an estimated 0.45%% risk of perioperative MI or cardiac arrest; no preoperative cardiac evaluation is indicated  - Keep NPO, continue knee immobilizer, pain-control, and supportive care   2. Hypertension  - Continue losartan     3. Type II DM  - Check CBGs and use low-intensity SSI for now      DVT prophylaxis: SCDs Code Status: Full  Level of Care: Level of care: Med-Surg Family Communication: Husband at bedside   Disposition Plan:  Patient is from: Home  Anticipated d/c is to: TBD Anticipated d/c date is: 06/15/24  Patient currently: Pending orthopedic surgery consultation and management of fractures  Consults called: Orthopedic surgery  Admission status: Inpatient     Shirley GORMAN Sprinkles, MD [facility]  06/12/2024, 3:07 AM

## 2024-06-12 NOTE — Consult Note (Signed)
 Reason for Consult:Right tibia plateau fx Referring Physician: Medford Dalton Time called: 1339 Time at bedside: 1346   Shirley Sullivan is an 63 y.o. female.  HPI: Raini slipped and fell at home and landed on her right knee. She had immediate pain and could not bear weight. She was brought to [facility] where x-rays showed a tibia plateau fx and orthopedic surgery was consulted. Due to the complexity of the fx orthopedic trauma consultation was requested and she was transferred to [facility] for definitive care. She lives at home with her husband and son and is retired.  Past Medical History:  Diagnosis Date   Allergy    Anxiety    Arthritis    Asthma    perfumes triggers attacks. Has inhalers for rescue   COVID-19 10/09/2020   and 08/15/20   Depression    Diabetes mellitus without complication (HCC)    History of kidney stones 1998, 2015   Lupus    Septic shock (HCC)    Klebsiella oxytocin and Enterobacter cloacae bacteremia/UTI    Past Surgical History:  Procedure Laterality Date   ABDOMINAL HYSTERECTOMY  2004   APPENDECTOMY  1992   CYSTOSCOPY WITH RETROGRADE PYELOGRAM, URETEROSCOPY AND STENT PLACEMENT Left 08/11/2014   Procedure: CYSTOSCOPY WITH RETROGRADE PYELOGRAM, URETEROSCOPY AND STENT PLACEMENT,  DIGITAL FLEXIBLE URETEROSCOPE;  Surgeon: Gretel Ferrara, MD;  Location: [facility];  Service: Urology;  Laterality: Left;  request digital flexible ureteroscope   CYSTOSCOPY WITH RETROGRADE PYELOGRAM, URETEROSCOPY AND STENT PLACEMENT Left 08/23/2014   Procedure: CYSTOSCOPY WITH RETROGRADE PYELOGRAM, URETEROSCOPY AND STENT EXCHANGE;  Surgeon: Gretel Ferrara, MD;  Location: [facility];  Service: Urology;  Laterality: Left;   CYSTOSCOPY/URETEROSCOPY/HOLMIUM LASER/STENT PLACEMENT Right 11/02/2020   Procedure: CYSTOSCOPY/URETEROSCOPY/HOLMIUM LASER/STENT PLACEMENT/ REMOVAL OF RIGHT NEPHROSTOMY TUBE;  Surgeon: Ferrara Gretel, MD;  Location: [facility];  Service: Urology;  Laterality: Right;  patient will not need to be covid  tested, tested positive on 11/14/20 has proof   HOLMIUM LASER APPLICATION Left 08/23/2014   Procedure: HOLMIUM LASER APPLICATION;  Surgeon: Gretel Ferrara, MD;  Location: [facility];  Service: Urology;  Laterality: Left;   IR NEPHROSTOMY EXCHANGE RIGHT  10/17/2020   IR NEPHROSTOMY PLACEMENT RIGHT  10/10/2020   ROTATOR CUFF REPAIR Right 2007    Family History  Problem Relation Age of Onset   Hyperlipidemia Mother     Social History:  reports that she has never smoked. She has never used smokeless tobacco. She reports that she does not drink alcohol and does not use drugs.  Allergies:  Allergies  Allergen Reactions   Codeine Itching   Strawberry Extract Hives, Itching and Other (See Comments)    Can't breathe   Vibramycin [Doxycycline Calcium] Nausea And Vomiting    Medications: I have reviewed the patient's current medications.  Results for orders placed or performed during the hospital encounter of 06/11/24 (from the past 48 hours)  CBC with Differential     Status: None   Collection Time: 06/12/24  2:15 AM  Result Value Ref Range   WBC 7.6 4.0 - 10.5 K/uL   RBC 4.76 3.87 - 5.11 MIL/uL   Hemoglobin 13.6 12.0 - 15.0 g/dL   HCT 59.1 63.9 - 53.9 %   MCV 85.7 80.0 - 100.0 fL   MCH 28.6 26.0 - 34.0 pg   MCHC 33.3 30.0 - 36.0 g/dL   RDW 85.7 88.4 - 84.4 %   Platelets 186 150 - 400 K/uL   nRBC 0.0 0.0 - 0.2 %   Neutrophils  Relative % 61 %   Neutro Abs 4.7 1.7 - 7.7 K/uL   Lymphocytes Relative 29 %   Lymphs Abs 2.2 0.7 - 4.0 K/uL   Monocytes Relative 8 %   Monocytes Absolute 0.6 0.1 - 1.0 K/uL   Eosinophils Relative 1 %   Eosinophils Absolute 0.1 0.0 - 0.5 K/uL   Basophils Relative 1 %   Basophils Absolute 0.0 0.0 - 0.1 K/uL   Immature Granulocytes 0 %   Abs Immature Granulocytes 0.02 0.00 - 0.07 K/uL    Comment: Performed at [facility], [address], [place], [place] [address]  I-stat chem 8, ED (not at [facility], [facility] or [facility])     Status: Abnormal   Collection  Time: 06/12/24  2:27 AM  Result Value Ref Range   Sodium 138 135 - 145 mmol/L   Potassium 4.1 3.5 - 5.1 mmol/L   Chloride 106 98 - 111 mmol/L   BUN 22 8 - 23 mg/dL   Creatinine, Ser 9.29 0.44 - 1.00 mg/dL   Glucose, Bld 873 (H) 70 - 99 mg/dL    Comment: Glucose reference range applies only to samples taken after fasting for at least 8 hours.   Calcium, Ion 1.20 1.15 - 1.40 mmol/L   TCO2 20 (L) 22 - 32 mmol/L   Hemoglobin 13.9 12.0 - 15.0 g/dL   HCT 58.9 63.9 - 53.9 %  CBG monitoring, ED     Status: Abnormal   Collection Time: 06/12/24  4:05 AM  Result Value Ref Range   Glucose-Capillary 138 (H) 70 - 99 mg/dL    Comment: Glucose reference range applies only to samples taken after fasting for at least 8 hours.  HIV Antibody (routine testing w rflx)     Status: None   Collection Time: 06/12/24  6:01 AM  Result Value Ref Range   HIV Screen 4th Generation wRfx Non Reactive Non Reactive    Comment: Performed at [facility], [address], [place], [place] [address]  Basic metabolic panel     Status: Abnormal   Collection Time: 06/12/24  6:01 AM  Result Value Ref Range   Sodium 136 135 - 145 mmol/L   Potassium 3.9 3.5 - 5.1 mmol/L   Chloride 104 98 - 111 mmol/L   CO2 25 22 - 32 mmol/L   Glucose, Bld 138 (H) 70 - 99 mg/dL    Comment: Glucose reference range applies only to samples taken after fasting for at least 8 hours.   BUN 23 8 - 23 mg/dL   Creatinine, Ser 9.21 0.44 - 1.00 mg/dL   Calcium 9.1 8.9 - 89.6 mg/dL   GFR, Estimated >39 >39 mL/min    Comment: (NOTE) Calculated using the CKD-EPI Creatinine Equation (2021)    Anion gap 7 5 - 15    Comment: Performed at [facility], [address], [place], [place] [address]  CBC     Status: None   Collection Time: 06/12/24  6:01 AM  Result Value Ref Range   WBC 6.3 4.0 - 10.5 K/uL   RBC 4.48 3.87 - 5.11 MIL/uL   Hemoglobin 12.9 12.0 - 15.0 g/dL   HCT 60.9 63.9 - 53.9 %   MCV 87.1 80.0 - 100.0 fL   MCH  28.8 26.0 - 34.0 pg   MCHC 33.1 30.0 - 36.0 g/dL   RDW 85.7 88.4 - 84.4 %   Platelets 176 150 - 400 K/uL   nRBC 0.0 0.0 -  0.2 %    Comment: Performed at [facility], [address], [place], [place] [address]  CBG monitoring, ED     Status: Abnormal   Collection Time: 06/12/24  8:13 AM  Result Value Ref Range   Glucose-Capillary 132 (H) 70 - 99 mg/dL    Comment: Glucose reference range applies only to samples taken after fasting for at least 8 hours.   Comment 1 Notify RN   CBG monitoring, ED     Status: Abnormal   Collection Time: 06/12/24 12:13 PM  Result Value Ref Range   Glucose-Capillary 143 (H) 70 - 99 mg/dL    Comment: Glucose reference range applies only to samples taken after fasting for at least 8 hours.   Comment 1 Notify RN     CT Knee Right Wo Contrast Result Date: 06/12/2024 CLINICAL DATA:  Knee trauma with dislocation suspected. Fracture seen on recent radiographs. EXAM: CT OF THE RIGHT KNEE WITHOUT CONTRAST TECHNIQUE: Multidetector CT imaging of the right knee was performed according to the standard protocol. Multiplanar CT image reconstructions were also generated. RADIATION DOSE REDUCTION: This exam was performed according to the departmental dose-optimization program which includes automated exposure control, adjustment of the mA and/or kV according to patient size and/or use of iterative reconstruction technique. COMPARISON:  Right knee radiograph 06/12/2024 FINDINGS: Bones/Joint/Cartilage Comminuted fractures of the proximal tibial metaphysis with fracture lines extending to the medial tibial plateau. Transverse comminuted fractures of the proximal fibular neck without significant displacement. Small right knee effusion. Underlying degenerative changes in the right knee with medial compartment narrowing and small osteophyte formation. Chronic osteochondral defects in the medial tibial plateau. Old osteochondral defect in the anterior medial femoral  condyle with loose body in-situ. No dislocation. Ligaments Suboptimally assessed by CT. Muscles and Tendons No intramuscular mass or hematoma. Soft tissues Soft tissue infiltration over the anterior and medial infrapatellar region consistent with hematoma and contusion. IMPRESSION: 1. Comminuted fractures of the proximal tibial metaphysis with extension to the medial tibial plateau. 2. Comminuted transverse fractures of the proximal fibular shaft. 3. Mild effusion. 4. Infiltration in the anterior soft tissues consistent with contusion and hematoma. 5. Underlying degenerative changes in the medial compartment. Electronically Signed   By: Elsie Gravely M.D.   On: 06/12/2024 02:27   DG Knee Complete 4 Views Right Result Date: 06/12/2024 CLINICAL DATA:  Pain after a fall. EXAM: RIGHT KNEE - COMPLETE 4+ VIEW COMPARISON:  None Available. FINDINGS: Depressed intra-articular fracture of medial tibial plateau with fracture lines extending anterior to posterior through the metaphysis. Transverse comminuted fracture of the proximal fibular shaft. Soft tissue swelling in the upper aspect of the lower leg. No significant effusion. No dislocation at the knee joint. IMPRESSION: Comminuted and depressed intra-articular fracture of the medial tibial plateau. Mildly comminuted transverse fracture of the proximal fibular shaft. Electronically Signed   By: Elsie Gravely M.D.   On: 06/12/2024 00:33   DG Hip Unilat W or Wo Pelvis 2-3 Views Right Result Date: 06/12/2024 CLINICAL DATA:  Pain after a fall EXAM: DG HIP (WITH OR WITHOUT PELVIS) 2-3V RIGHT COMPARISON:  None Available. FINDINGS: Degenerative changes in the lower lumbar spine and in both hips. Pelvis and right hip appear intact. No acute fracture or dislocation. No focal bone lesion or bone destruction. Soft tissues are unremarkable. Tubing in the lower abdomen likely representing reservoir for gastric lap band. IMPRESSION: Degenerative changes in the right hip. No  acute displaced fractures are identified. Electronically Signed  By: Elsie Gravely M.D.   On: 06/12/2024 00:32    Review of Systems  HENT:  Negative for ear discharge, ear pain, hearing loss and tinnitus.   Eyes:  Negative for photophobia and pain.  Respiratory:  Negative for cough and shortness of breath.   Cardiovascular:  Negative for chest pain.  Gastrointestinal:  Negative for abdominal pain, nausea and vomiting.  Genitourinary:  Negative for dysuria, flank pain, frequency and urgency.  Musculoskeletal:  Positive for arthralgias (Right knee). Negative for back pain, myalgias and neck pain.  Neurological:  Negative for dizziness and headaches.  Hematological:  Does not bruise/bleed easily.  Psychiatric/Behavioral:  The patient is not nervous/anxious.    Blood pressure (!) 145/84, pulse 95, temperature 98 F (36.7 C), temperature source Oral, resp. rate 18, height 5' 4 (1.626 m), weight 83.9 kg, SpO2 96%. Physical Exam Constitutional:      General: She is not in acute distress.    Appearance: She is well-developed. She is not diaphoretic.  HENT:     Head: Normocephalic and atraumatic.  Eyes:     General: No scleral icterus.       Right eye: No discharge.        Left eye: No discharge.     Conjunctiva/sclera: Conjunctivae normal.  Cardiovascular:     Rate and Rhythm: Normal rate and regular rhythm.  Pulmonary:     Effort: Pulmonary effort is normal. No respiratory distress.  Musculoskeletal:     Cervical back: Normal range of motion.     Comments: RLE Ant knee abrasion, no ecchymosis or rash  Mod knee TTP, compartments soft, KI in place  No ankle effusion  Sens DPN, SPN, TN intact  Motor EHL, ext, flex, evers 5/5  DP 1+, PT 0, No significant edema  Skin:    General: Skin is warm and dry.  Neurological:     Mental Status: She is alert.  Psychiatric:        Mood and Affect: Mood normal.        Behavior: Behavior normal.     Assessment/Plan: Right tibia plateau  fx -- Plan ORIF today with Dr. Celena or Haddix. Please keep NPO.    Ozell DOROTHA Ned, PA-C Orthopedic Surgery [phone] 06/12/2024, 1:52 PM

## 2024-06-12 NOTE — Transfer of Care (Signed)
 Immediate Anesthesia Transfer of Care Note  Patient: Shirley Sullivan  Procedure(s) Performed: OPEN REDUCTION INTERNAL FIXATION (ORIF) TIBIA FRACTURE (Right)  Patient Location: PACU  Anesthesia Type:General  Level of Consciousness: awake, alert , and oriented  Airway & Oxygen Therapy: Patient Spontanous Breathing  Post-op Assessment: Report given to RN and Post -op Vital signs reviewed and stable  Post vital signs: Reviewed and stable  Last Vitals:  Vitals Value Taken Time  BP 171/90 06/12/24 16:38  Temp    Pulse 102 06/12/24 16:45  Resp 14 06/12/24 16:45  SpO2 90 % 06/12/24 16:45  Vitals shown include unfiled device data.  Last Pain:  Vitals:   06/12/24 1351  TempSrc:   PainSc: 7       Patients Stated Pain Goal: 0 (06/12/24 1218)  Complications: No notable events documented.

## 2024-06-12 NOTE — Hospital Course (Signed)
 63 y.o. F with obesity, HTN, DM, SLE not on DMARD and depression who presented with leg pain after a fall. X-ray in the ER showed tib/fib fracture.  Orthopedics recommended transfer to [facility] for traumatologist repair.

## 2024-06-12 NOTE — H&P (View-Only) (Signed)
 Reason for Consult:Right tibia plateau fx Referring Physician: Medford Dalton Time called: 1339 Time at bedside: 1346   Shirley Sullivan is an 63 y.o. female.  HPI: Raini slipped and fell at home and landed on her right knee. She had immediate pain and could not bear weight. She was brought to [facility] where x-rays showed a tibia plateau fx and orthopedic surgery was consulted. Due to the complexity of the fx orthopedic trauma consultation was requested and she was transferred to [facility] for definitive care. She lives at home with her husband and son and is retired.  Past Medical History:  Diagnosis Date   Allergy    Anxiety    Arthritis    Asthma    perfumes triggers attacks. Has inhalers for rescue   COVID-19 10/09/2020   and 08/15/20   Depression    Diabetes mellitus without complication (HCC)    History of kidney stones 1998, 2015   Lupus    Septic shock (HCC)    Klebsiella oxytocin and Enterobacter cloacae bacteremia/UTI    Past Surgical History:  Procedure Laterality Date   ABDOMINAL HYSTERECTOMY  2004   APPENDECTOMY  1992   CYSTOSCOPY WITH RETROGRADE PYELOGRAM, URETEROSCOPY AND STENT PLACEMENT Left 08/11/2014   Procedure: CYSTOSCOPY WITH RETROGRADE PYELOGRAM, URETEROSCOPY AND STENT PLACEMENT,  DIGITAL FLEXIBLE URETEROSCOPE;  Surgeon: Gretel Ferrara, MD;  Location: [facility];  Service: Urology;  Laterality: Left;  request digital flexible ureteroscope   CYSTOSCOPY WITH RETROGRADE PYELOGRAM, URETEROSCOPY AND STENT PLACEMENT Left 08/23/2014   Procedure: CYSTOSCOPY WITH RETROGRADE PYELOGRAM, URETEROSCOPY AND STENT EXCHANGE;  Surgeon: Gretel Ferrara, MD;  Location: [facility];  Service: Urology;  Laterality: Left;   CYSTOSCOPY/URETEROSCOPY/HOLMIUM LASER/STENT PLACEMENT Right 11/02/2020   Procedure: CYSTOSCOPY/URETEROSCOPY/HOLMIUM LASER/STENT PLACEMENT/ REMOVAL OF RIGHT NEPHROSTOMY TUBE;  Surgeon: Ferrara Gretel, MD;  Location: [facility];  Service: Urology;  Laterality: Right;  patient will not need to be covid  tested, tested positive on 11/14/20 has proof   HOLMIUM LASER APPLICATION Left 08/23/2014   Procedure: HOLMIUM LASER APPLICATION;  Surgeon: Gretel Ferrara, MD;  Location: [facility];  Service: Urology;  Laterality: Left;   IR NEPHROSTOMY EXCHANGE RIGHT  10/17/2020   IR NEPHROSTOMY PLACEMENT RIGHT  10/10/2020   ROTATOR CUFF REPAIR Right 2007    Family History  Problem Relation Age of Onset   Hyperlipidemia Mother     Social History:  reports that she has never smoked. She has never used smokeless tobacco. She reports that she does not drink alcohol and does not use drugs.  Allergies:  Allergies  Allergen Reactions   Codeine Itching   Strawberry Extract Hives, Itching and Other (See Comments)    Can't breathe   Vibramycin [Doxycycline Calcium] Nausea And Vomiting    Medications: I have reviewed the patient's current medications.  Results for orders placed or performed during the hospital encounter of 06/11/24 (from the past 48 hours)  CBC with Differential     Status: None   Collection Time: 06/12/24  2:15 AM  Result Value Ref Range   WBC 7.6 4.0 - 10.5 K/uL   RBC 4.76 3.87 - 5.11 MIL/uL   Hemoglobin 13.6 12.0 - 15.0 g/dL   HCT 59.1 63.9 - 53.9 %   MCV 85.7 80.0 - 100.0 fL   MCH 28.6 26.0 - 34.0 pg   MCHC 33.3 30.0 - 36.0 g/dL   RDW 85.7 88.4 - 84.4 %   Platelets 186 150 - 400 K/uL   nRBC 0.0 0.0 - 0.2 %   Neutrophils  Relative % 61 %   Neutro Abs 4.7 1.7 - 7.7 K/uL   Lymphocytes Relative 29 %   Lymphs Abs 2.2 0.7 - 4.0 K/uL   Monocytes Relative 8 %   Monocytes Absolute 0.6 0.1 - 1.0 K/uL   Eosinophils Relative 1 %   Eosinophils Absolute 0.1 0.0 - 0.5 K/uL   Basophils Relative 1 %   Basophils Absolute 0.0 0.0 - 0.1 K/uL   Immature Granulocytes 0 %   Abs Immature Granulocytes 0.02 0.00 - 0.07 K/uL    Comment: Performed at [facility], [address], [place], [place] [address]  I-stat chem 8, ED (not at [facility], [facility] or [facility])     Status: Abnormal   Collection  Time: 06/12/24  2:27 AM  Result Value Ref Range   Sodium 138 135 - 145 mmol/L   Potassium 4.1 3.5 - 5.1 mmol/L   Chloride 106 98 - 111 mmol/L   BUN 22 8 - 23 mg/dL   Creatinine, Ser 9.29 0.44 - 1.00 mg/dL   Glucose, Bld 873 (H) 70 - 99 mg/dL    Comment: Glucose reference range applies only to samples taken after fasting for at least 8 hours.   Calcium, Ion 1.20 1.15 - 1.40 mmol/L   TCO2 20 (L) 22 - 32 mmol/L   Hemoglobin 13.9 12.0 - 15.0 g/dL   HCT 58.9 63.9 - 53.9 %  CBG monitoring, ED     Status: Abnormal   Collection Time: 06/12/24  4:05 AM  Result Value Ref Range   Glucose-Capillary 138 (H) 70 - 99 mg/dL    Comment: Glucose reference range applies only to samples taken after fasting for at least 8 hours.  HIV Antibody (routine testing w rflx)     Status: None   Collection Time: 06/12/24  6:01 AM  Result Value Ref Range   HIV Screen 4th Generation wRfx Non Reactive Non Reactive    Comment: Performed at [facility], [address], [place], [place] [address]  Basic metabolic panel     Status: Abnormal   Collection Time: 06/12/24  6:01 AM  Result Value Ref Range   Sodium 136 135 - 145 mmol/L   Potassium 3.9 3.5 - 5.1 mmol/L   Chloride 104 98 - 111 mmol/L   CO2 25 22 - 32 mmol/L   Glucose, Bld 138 (H) 70 - 99 mg/dL    Comment: Glucose reference range applies only to samples taken after fasting for at least 8 hours.   BUN 23 8 - 23 mg/dL   Creatinine, Ser 9.21 0.44 - 1.00 mg/dL   Calcium 9.1 8.9 - 89.6 mg/dL   GFR, Estimated >39 >39 mL/min    Comment: (NOTE) Calculated using the CKD-EPI Creatinine Equation (2021)    Anion gap 7 5 - 15    Comment: Performed at [facility], [address], [place], [place] [address]  CBC     Status: None   Collection Time: 06/12/24  6:01 AM  Result Value Ref Range   WBC 6.3 4.0 - 10.5 K/uL   RBC 4.48 3.87 - 5.11 MIL/uL   Hemoglobin 12.9 12.0 - 15.0 g/dL   HCT 60.9 63.9 - 53.9 %   MCV 87.1 80.0 - 100.0 fL   MCH  28.8 26.0 - 34.0 pg   MCHC 33.1 30.0 - 36.0 g/dL   RDW 85.7 88.4 - 84.4 %   Platelets 176 150 - 400 K/uL   nRBC 0.0 0.0 -  0.2 %    Comment: Performed at [facility], [address], [place], [place] [address]  CBG monitoring, ED     Status: Abnormal   Collection Time: 06/12/24  8:13 AM  Result Value Ref Range   Glucose-Capillary 132 (H) 70 - 99 mg/dL    Comment: Glucose reference range applies only to samples taken after fasting for at least 8 hours.   Comment 1 Notify RN   CBG monitoring, ED     Status: Abnormal   Collection Time: 06/12/24 12:13 PM  Result Value Ref Range   Glucose-Capillary 143 (H) 70 - 99 mg/dL    Comment: Glucose reference range applies only to samples taken after fasting for at least 8 hours.   Comment 1 Notify RN     CT Knee Right Wo Contrast Result Date: 06/12/2024 CLINICAL DATA:  Knee trauma with dislocation suspected. Fracture seen on recent radiographs. EXAM: CT OF THE RIGHT KNEE WITHOUT CONTRAST TECHNIQUE: Multidetector CT imaging of the right knee was performed according to the standard protocol. Multiplanar CT image reconstructions were also generated. RADIATION DOSE REDUCTION: This exam was performed according to the departmental dose-optimization program which includes automated exposure control, adjustment of the mA and/or kV according to patient size and/or use of iterative reconstruction technique. COMPARISON:  Right knee radiograph 06/12/2024 FINDINGS: Bones/Joint/Cartilage Comminuted fractures of the proximal tibial metaphysis with fracture lines extending to the medial tibial plateau. Transverse comminuted fractures of the proximal fibular neck without significant displacement. Small right knee effusion. Underlying degenerative changes in the right knee with medial compartment narrowing and small osteophyte formation. Chronic osteochondral defects in the medial tibial plateau. Old osteochondral defect in the anterior medial femoral  condyle with loose body in-situ. No dislocation. Ligaments Suboptimally assessed by CT. Muscles and Tendons No intramuscular mass or hematoma. Soft tissues Soft tissue infiltration over the anterior and medial infrapatellar region consistent with hematoma and contusion. IMPRESSION: 1. Comminuted fractures of the proximal tibial metaphysis with extension to the medial tibial plateau. 2. Comminuted transverse fractures of the proximal fibular shaft. 3. Mild effusion. 4. Infiltration in the anterior soft tissues consistent with contusion and hematoma. 5. Underlying degenerative changes in the medial compartment. Electronically Signed   By: Elsie Gravely M.D.   On: 06/12/2024 02:27   DG Knee Complete 4 Views Right Result Date: 06/12/2024 CLINICAL DATA:  Pain after a fall. EXAM: RIGHT KNEE - COMPLETE 4+ VIEW COMPARISON:  None Available. FINDINGS: Depressed intra-articular fracture of medial tibial plateau with fracture lines extending anterior to posterior through the metaphysis. Transverse comminuted fracture of the proximal fibular shaft. Soft tissue swelling in the upper aspect of the lower leg. No significant effusion. No dislocation at the knee joint. IMPRESSION: Comminuted and depressed intra-articular fracture of the medial tibial plateau. Mildly comminuted transverse fracture of the proximal fibular shaft. Electronically Signed   By: Elsie Gravely M.D.   On: 06/12/2024 00:33   DG Hip Unilat W or Wo Pelvis 2-3 Views Right Result Date: 06/12/2024 CLINICAL DATA:  Pain after a fall EXAM: DG HIP (WITH OR WITHOUT PELVIS) 2-3V RIGHT COMPARISON:  None Available. FINDINGS: Degenerative changes in the lower lumbar spine and in both hips. Pelvis and right hip appear intact. No acute fracture or dislocation. No focal bone lesion or bone destruction. Soft tissues are unremarkable. Tubing in the lower abdomen likely representing reservoir for gastric lap band. IMPRESSION: Degenerative changes in the right hip. No  acute displaced fractures are identified. Electronically Signed  By: Elsie Gravely M.D.   On: 06/12/2024 00:32    Review of Systems  HENT:  Negative for ear discharge, ear pain, hearing loss and tinnitus.   Eyes:  Negative for photophobia and pain.  Respiratory:  Negative for cough and shortness of breath.   Cardiovascular:  Negative for chest pain.  Gastrointestinal:  Negative for abdominal pain, nausea and vomiting.  Genitourinary:  Negative for dysuria, flank pain, frequency and urgency.  Musculoskeletal:  Positive for arthralgias (Right knee). Negative for back pain, myalgias and neck pain.  Neurological:  Negative for dizziness and headaches.  Hematological:  Does not bruise/bleed easily.  Psychiatric/Behavioral:  The patient is not nervous/anxious.    Blood pressure (!) 145/84, pulse 95, temperature 98 F (36.7 C), temperature source Oral, resp. rate 18, height 5' 4 (1.626 m), weight 83.9 kg, SpO2 96%. Physical Exam Constitutional:      General: She is not in acute distress.    Appearance: She is well-developed. She is not diaphoretic.  HENT:     Head: Normocephalic and atraumatic.  Eyes:     General: No scleral icterus.       Right eye: No discharge.        Left eye: No discharge.     Conjunctiva/sclera: Conjunctivae normal.  Cardiovascular:     Rate and Rhythm: Normal rate and regular rhythm.  Pulmonary:     Effort: Pulmonary effort is normal. No respiratory distress.  Musculoskeletal:     Cervical back: Normal range of motion.     Comments: RLE Ant knee abrasion, no ecchymosis or rash  Mod knee TTP, compartments soft, KI in place  No ankle effusion  Sens DPN, SPN, TN intact  Motor EHL, ext, flex, evers 5/5  DP 1+, PT 0, No significant edema  Skin:    General: Skin is warm and dry.  Neurological:     Mental Status: She is alert.  Psychiatric:        Mood and Affect: Mood normal.        Behavior: Behavior normal.     Assessment/Plan: Right tibia plateau  fx -- Plan ORIF today with Dr. Celena or Haddix. Please keep NPO.    Ozell DOROTHA Ned, PA-C Orthopedic Surgery [phone] 06/12/2024, 1:52 PM

## 2024-06-12 NOTE — ED Notes (Signed)
[facility] here to transport patient to [facility]

## 2024-06-12 NOTE — Progress Notes (Signed)
    Patient: Shirley Sullivan FMW:[id number] DOB: 1961-06-13      Brief hospital course: 63 y.o. F with obesity, HTN, DM, SLE not on DMARD and depression who presented with leg pain after a fall. X-ray in the ER showed tib/fib fracture.  Orthopedics recommended transfer to [facility] for traumatologist repair.      This is a no charge note, for further details, please see the H&P by my partner, Dr. Charlton from earlier today.   Principal Problem:   Closed fracture of right proximal tibia Active Problems:   Closed fracture of shaft of right fibula   Diabetes mellitus without complication (HCC)   Hypertension   Class 1 obesity    Discussed pain management options.  Hopefully to the OR today.       Physical Exam: BP 128/72 (BP Location: Right Arm)   Pulse 73   Temp 98.2 F (36.8 C) (Oral)   Resp 16   SpO2 97%   Patient seen and examined.     Family Communication: None present        Author: Lonni SHAUNNA Dalton, MD 06/12/2024 8:37 AM

## 2024-06-12 NOTE — Anesthesia Procedure Notes (Signed)
 Procedure Name: Intubation Date/Time: 06/12/2024 3:18 PM  Performed by: Mannie Krystal LABOR, CRNAPre-anesthesia Checklist: Patient identified, Emergency Drugs available, Suction available and Patient being monitored Patient Re-evaluated:Patient Re-evaluated prior to induction Oxygen Delivery Method: Circle system utilized Preoxygenation: Pre-oxygenation with 100% oxygen Induction Type: IV induction Ventilation: Mask ventilation without difficulty and Oral airway inserted - appropriate to patient size Laryngoscope Size: Mac and 4 Grade View: Grade II Tube type: Oral Tube size: 7.0 mm Number of attempts: 1 Airway Equipment and Method: Stylet and Oral airway Placement Confirmation: ETT inserted through vocal cords under direct vision, positive ETCO2 and breath sounds checked- equal and bilateral Secured at: 22 cm Tube secured with: Tape Dental Injury: Teeth and Oropharynx as per pre-operative assessment

## 2024-06-12 NOTE — Interval H&P Note (Signed)
 History and Physical Interval Note:  06/12/2024 3:07 PM  Shirley Sullivan  has presented today for surgery, with the diagnosis of Right Lower Leg Fracture.  The various methods of treatment have been discussed with the patient and family. After consideration of risks, benefits and other options for treatment, the patient has consented to  Procedure(s): OPEN REDUCTION INTERNAL FIXATION (ORIF) TIBIA FRACTURE (Right) as a surgical intervention.  The patient's history has been reviewed, patient examined, no change in status, stable for surgery.  I have reviewed the patient's chart and labs.  Questions were answered to the patient's satisfaction.     Keelyn Fjelstad P Rusty Villella

## 2024-06-13 DIAGNOSIS — S82451A Displaced comminuted fracture of shaft of right fibula, initial encounter for closed fracture: Secondary | ICD-10-CM

## 2024-06-13 DIAGNOSIS — S82141A Displaced bicondylar fracture of right tibia, initial encounter for closed fracture: Secondary | ICD-10-CM | POA: Diagnosis not present

## 2024-06-13 LAB — BASIC METABOLIC PANEL WITH GFR
Anion gap: 8 (ref 5–15)
BUN: 10 mg/dL (ref 8–23)
CO2: 25 mmol/L (ref 22–32)
Calcium: 8.7 mg/dL — ABNORMAL LOW (ref 8.9–10.3)
Chloride: 104 mmol/L (ref 98–111)
Creatinine, Ser: 0.79 mg/dL (ref 0.44–1.00)
GFR, Estimated: >60 mL/min (ref >60)
Glucose, Bld: 131 mg/dL — ABNORMAL HIGH (ref 70–99)
Potassium: 3.8 mmol/L (ref 3.5–5.1)
Sodium: 137 mmol/L (ref 135–145)

## 2024-06-13 LAB — GLUCOSE, CAPILLARY
Glucose-Capillary: 138 mg/dL — ABNORMAL HIGH (ref 70–99)
Glucose-Capillary: 131 mg/dL — ABNORMAL HIGH (ref 70–99)
Glucose-Capillary: 175 mg/dL — ABNORMAL HIGH (ref 70–99)
Glucose-Capillary: 146 mg/dL — ABNORMAL HIGH (ref 70–99)
Glucose-Capillary: 150 mg/dL — ABNORMAL HIGH (ref 70–99)

## 2024-06-13 LAB — CBC
HCT: 35.1 % — ABNORMAL LOW (ref 36.0–46.0)
Hemoglobin: 11.6 g/dL — ABNORMAL LOW (ref 12.0–15.0)
MCH: 28.9 pg (ref 26.0–34.0)
MCHC: 33 g/dL (ref 30.0–36.0)
MCV: 87.3 fL (ref 80.0–100.0)
Platelets: 159 K/uL (ref 150–400)
RBC: 4.02 MIL/uL (ref 3.87–5.11)
RDW: 14.5 % (ref 11.5–15.5)
WBC: 6.4 K/uL (ref 4.0–10.5)
nRBC: 0 % (ref 0.0–0.2)

## 2024-06-13 MED ORDER — METHOCARBAMOL 500 MG PO TABS: 500.0000 mg | ORAL_TABLET | Freq: Four times a day (QID) | ORAL | 0 refills | Status: DC | PRN

## 2024-06-13 MED ORDER — ASPIRIN 325 MG PO TBEC: 325.0000 mg | DELAYED_RELEASE_TABLET | Freq: Every day | ORAL | 0 refills | Status: DC

## 2024-06-13 MED ORDER — VITAMIN D (ERGOCALCIFEROL) 1.25 MG (50000 UNIT) PO CAPS
50000.0000 [IU] | ORAL_CAPSULE | ORAL | Status: DC
  Filled 2024-06-13 (×2): qty 1

## 2024-06-13 MED ORDER — OXYCODONE HCL 5 MG PO TABS
15.0000 mg | ORAL_TABLET | ORAL | Status: DC | PRN
  Administered 2024-06-13 – 2024-06-15 (×8): 15 mg via ORAL
  Filled 2024-06-13 (×8): qty 3

## 2024-06-13 MED ORDER — OXYCODONE HCL 5 MG PO TABS: 7.5000 mg | ORAL_TABLET | ORAL | Status: DC | PRN

## 2024-06-13 MED ORDER — GABAPENTIN 400 MG PO CAPS: ORAL_CAPSULE | ORAL | 1 refills | Status: DC

## 2024-06-13 MED ORDER — ACETAMINOPHEN 500 MG PO TABS
1000.0000 mg | ORAL_TABLET | Freq: Three times a day (TID) | ORAL | Status: DC
  Administered 2024-06-13 – 2024-06-15 (×5): 1000 mg via ORAL
  Filled 2024-06-13 (×6): qty 2

## 2024-06-13 MED ORDER — OXYCODONE HCL 5 MG PO TABS: 5.0000 mg | ORAL_TABLET | ORAL | 0 refills | Status: DC | PRN

## 2024-06-13 MED ORDER — POLYETHYLENE GLYCOL 3350 17 G PO PACK
17.0000 g | PACK | Freq: Two times a day (BID) | ORAL | Status: DC
  Filled 2024-06-13 (×4): qty 1

## 2024-06-13 MED ORDER — HYDROMORPHONE HCL 1 MG/ML IJ SOLN
0.5000 mg | INTRAMUSCULAR | Status: DC | PRN
  Administered 2024-06-13: 1 mg via INTRAVENOUS
  Filled 2024-06-13: qty 1

## 2024-06-13 NOTE — Progress Notes (Signed)
 PT Cancellation Note  Patient Details Name: Shirley Sullivan MRN: [id number] DOB: 09-30-1961   Cancelled Treatment:    Reason Eval/Treat Not Completed: Patient declined, no reason specified.  Pt had just transferred chair to [facility] to bed, immediately prior to arrival.  Wants to wait until tomorrow for eval. 06/13/2024  Shirley HERO., PT Acute Rehabilitation Services [phone]  (office)   Shirley Sullivan Clos 06/13/2024, 1:55 PM

## 2024-06-13 NOTE — Progress Notes (Signed)
 PROGRESS NOTE    Shirley Sullivan  FMW:[id number] DOB: Dec 15, 1960 DOA: 06/11/2024 PCP: Montey Presto, MD  Chief Complaint  Patient presents with   Fall    Brief Narrative:   Shirley Sullivan is Shirley Sullivan 63 y.o. female with medical history significant for hypertension, type 2 diabetes mellitus, nephrolithiasis, lupus in remission, depression, and anxiety who presents with right knee pain and swelling after Ajamu Maxon fall.   Admitted for R tib/fib fracture, now s/p ORIF by orthopedics.   Assessment & Plan:   Principal Problem:   Closed fracture of right proximal tibia Active Problems:   Closed fracture of shaft of right fibula   Diabetes mellitus without complication (HCC)   Hypertension   Class 1 obesity  1. Right tibia and fibula fractures  - CT showed comminuted fractures of the proximal tibial metaphysis with extension to the medial tibial plateau, comminuted transverse fractures of the proximal fibular shaft - s/p ORIF R proximal tibia fracture - post op care by ortho trauma, touchdown weightbearing to RLE, lovenox  for DVT ppx (aspirin  on discharge), PT/OT - pain management, bowel regimen   2. Hypertension  - Continue losartan      3. Type II DM  Peripheral Neuropathy - Check CBGs and use low-intensity SSI for now   - A1c 5.8 - holding home metformin  - gabapentin    # Osteoporosis - taking alendronate weekly  # Vitamin D  Deficiency - replace, follow outpatient  # Depression - zoloft      DVT prophylaxis: lovenox  Code Status: full Family Communication: none Disposition:   Status is: Inpatient Remains inpatient appropriate because: need for further post op care, pain control   Consultants:  orthopedics  Procedures:  7/12 Open reduction internal fixation of right proximal tibia fracture   Antimicrobials:  Anti-infectives (From admission, onward)    Start     Dose/Rate Route Frequency Ordered Stop   06/12/24 2200  ceFAZolin  (ANCEF ) IVPB 2g/100 mL premix         2 g 200 mL/hr over 30 Minutes Intravenous Every 8 hours 06/12/24 1851 06/13/24 2159   06/12/24 1618  vancomycin  (VANCOCIN ) powder  Status:  Discontinued          As needed 06/12/24 1618 06/12/24 1632   06/12/24 1415  ceFAZolin  (ANCEF ) IVPB 2g/100 mL premix        2 g 200 mL/hr over 30 Minutes Intravenous On call to O.R. 06/12/24 1357 06/12/24 1522       Subjective: C/o uncontrolled pain  Objective: Vitals:   06/13/24 0455 06/13/24 0458 06/13/24 0753 06/13/24 0753  BP: (!) 148/70 (!) 148/70 (!) 146/73 (!) 146/73  Pulse: 82 79 88 89  Resp:  17 16 16   Temp:  97.9 F (36.6 C) 98 F (36.7 C) 98 F (36.7 C)  TempSrc:   Oral Oral  SpO2: 100% 100% 99% 100%  Weight:      Height:        Intake/Output Summary (Last 24 hours) at 06/13/2024 0856 Last data filed at 06/12/2024 2200 Gross per 24 hour  Intake 1000 ml  Output 850 ml  Net 150 ml   Filed Weights   06/12/24 1337  Weight: 83.9 kg    Examination:  General exam: Appears calm and comfortable  Respiratory system: unlabored Cardiovascular system: RRR Central nervous system: Alert and oriented. No focal neurological deficits. Extremities: RLE in dressing   Data Reviewed: I have personally reviewed following labs and imaging studies  CBC: Recent Labs  Lab 06/12/24 0215 06/12/24  9772 06/12/24 0601 06/12/24 1937 06/13/24 0727  WBC 7.6  --  6.3 7.3 6.4  NEUTROABS 4.7  --   --   --   --   HGB 13.6 13.9 12.9 12.4 11.6*  HCT 40.8 41.0 39.0 37.6 35.1*  MCV 85.7  --  87.1 87.2 87.3  PLT 186  --  176 148* 159    Basic Metabolic Panel: Recent Labs  Lab 06/12/24 0227 06/12/24 0601 06/12/24 1937 06/13/24 0727  NA 138 136  --  137  K 4.1 3.9  --  3.8  CL 106 104  --  104  CO2  --  25  --  25  GLUCOSE 126* 138*  --  131*  BUN 22 23  --  10  CREATININE 0.70 0.78 0.75 0.79  CALCIUM  --  9.1  --  8.7*    GFR: Estimated Creatinine Clearance: 73.5 mL/min (by C-G formula based on SCr of 0.79 mg/dL).  Liver  Function Tests: No results for input(s): AST, ALT, ALKPHOS, BILITOT, PROT, ALBUMIN in the last 168 hours.  CBG: Recent Labs  Lab 06/12/24 1638 06/12/24 2014 06/12/24 2325 06/13/24 0458 06/13/24 0750  GLUCAP 123* 164* 150* 138* 131*     Recent Results (from the past 240 hours)  Surgical pcr screen     Status: None   Collection Time: 06/12/24  1:47 PM   Specimen: Nasal Mucosa; Nasal Swab  Result Value Ref Range Status   MRSA, PCR NEGATIVE NEGATIVE Final   Staphylococcus aureus NEGATIVE NEGATIVE Final    Comment: (NOTE) The Xpert SA Assay (FDA approved for NASAL specimens in patients 28 years of age and older), is one component of Corrisa Gibby comprehensive surveillance program. It is not intended to diagnose infection nor to guide or monitor treatment. Performed at [facility], [address], [place], [place] [address]          Radiology Studies: DG Knee Right Port Result Date: 06/12/2024 CLINICAL DATA:  Fracture, postop. EXAM: PORTABLE RIGHT KNEE - 1-2 VIEW COMPARISON:  Preoperative imaging FINDINGS: Lateral plate and screw fixation of proximal tibial fracture. Unchanged fracture alignment. Proximal fibular fracture is in unchanged alignment. Recent postsurgical change includes air and edema in the soft tissues. IMPRESSION: ORIF of proximal tibial fracture without immediate postoperative complication. Electronically Signed   By: Andrea Gasman M.D.   On: 06/12/2024 18:06   DG Tibia/Fibula Right Result Date: 06/12/2024 CLINICAL DATA:  Elective surgery. EXAM: RIGHT TIBIA AND FIBULA - 2 VIEW COMPARISON:  Preoperative imaging FINDINGS: Five fluoroscopic spot views of the right proximal tibia and fibula submitted from the operating room. Lateral plate and screw fixation of proximal tibial fracture. Again seen proximal fibular fracture. Fluoroscopy time 56.9 seconds. Dose 2.39 mGy. IMPRESSION: Intraoperative fluoroscopy during proximal tibial fracture fixation.  Electronically Signed   By: Andrea Gasman M.D.   On: 06/12/2024 18:05   DG C-Arm 1-60 Min-No Report Result Date: 06/12/2024 Fluoroscopy was utilized by the requesting physician.  No radiographic interpretation.   CT Knee Right Wo Contrast Result Date: 06/12/2024 CLINICAL DATA:  Knee trauma with dislocation suspected. Fracture seen on recent radiographs. EXAM: CT OF THE RIGHT KNEE WITHOUT CONTRAST TECHNIQUE: Multidetector CT imaging of the right knee was performed according to the standard protocol. Multiplanar CT image reconstructions were also generated. RADIATION DOSE REDUCTION: This exam was performed according to the departmental dose-optimization program which includes automated exposure control, adjustment of the mA and/or kV according to patient size and/or use of  iterative reconstruction technique. COMPARISON:  Right knee radiograph 06/12/2024 FINDINGS: Bones/Joint/Cartilage Comminuted fractures of the proximal tibial metaphysis with fracture lines extending to the medial tibial plateau. Transverse comminuted fractures of the proximal fibular neck without significant displacement. Small right knee effusion. Underlying degenerative changes in the right knee with medial compartment narrowing and small osteophyte formation. Chronic osteochondral defects in the medial tibial plateau. Old osteochondral defect in the anterior medial femoral condyle with loose body in-situ. No dislocation. Ligaments Suboptimally assessed by CT. Muscles and Tendons No intramuscular mass or hematoma. Soft tissues Soft tissue infiltration over the anterior and medial infrapatellar region consistent with hematoma and contusion. IMPRESSION: 1. Comminuted fractures of the proximal tibial metaphysis with extension to the medial tibial plateau. 2. Comminuted transverse fractures of the proximal fibular shaft. 3. Mild effusion. 4. Infiltration in the anterior soft tissues consistent with contusion and hematoma. 5. Underlying  degenerative changes in the medial compartment. Electronically Signed   By: Elsie Gravely M.D.   On: 06/12/2024 02:27   DG Knee Complete 4 Views Right Result Date: 06/12/2024 CLINICAL DATA:  Pain after Skyylar Kopf fall. EXAM: RIGHT KNEE - COMPLETE 4+ VIEW COMPARISON:  None Available. FINDINGS: Depressed intra-articular fracture of medial tibial plateau with fracture lines extending anterior to posterior through the metaphysis. Transverse comminuted fracture of the proximal fibular shaft. Soft tissue swelling in the upper aspect of the lower leg. No significant effusion. No dislocation at the knee joint. IMPRESSION: Comminuted and depressed intra-articular fracture of the medial tibial plateau. Mildly comminuted transverse fracture of the proximal fibular shaft. Electronically Signed   By: Elsie Gravely M.D.   On: 06/12/2024 00:33   DG Hip Unilat W or Wo Pelvis 2-3 Views Right Result Date: 06/12/2024 CLINICAL DATA:  Pain after Brenten Janney fall EXAM: DG HIP (WITH OR WITHOUT PELVIS) 2-3V RIGHT COMPARISON:  None Available. FINDINGS: Degenerative changes in the lower lumbar spine and in both hips. Pelvis and right hip appear intact. No acute fracture or dislocation. No focal bone lesion or bone destruction. Soft tissues are unremarkable. Tubing in the lower abdomen likely representing reservoir for gastric lap band. IMPRESSION: Degenerative changes in the right hip. No acute displaced fractures are identified. Electronically Signed   By: Elsie Gravely M.D.   On: 06/12/2024 00:32        Scheduled Meds:  acetaminophen   1,000 mg Oral Q8H   docusate sodium   100 mg Oral BID   enoxaparin  (LOVENOX ) injection  40 mg Subcutaneous Q24H   gabapentin   400 mg Oral TID   insulin  aspart  0-6 Units Subcutaneous Q4H   loratadine   10 mg Oral Daily   losartan   25 mg Oral Daily   sertraline   100 mg Oral QHS   sodium chloride  flush  3 mL Intravenous Q12H   Vitamin D  (Ergocalciferol )  50,000 Units Oral Q7 days   Continuous  Infusions:   ceFAZolin  (ANCEF ) IV 2 g (06/13/24 0418)     LOS: 1 day    Time spent: over 30 min    Meliton Monte, MD [facility]   To contact the attending provider between 7A-7P or the covering provider during after hours 7P-7A, please log into the web site [email or web address] and access using universal Cole password for that web site. If you do not have the password, please call the hospital operator.  06/13/2024, 8:56 AM

## 2024-06-13 NOTE — Plan of Care (Signed)
  Problem: Coping: Goal: Ability to adjust to condition or change in health will improve Outcome: Progressing   Problem: Fluid Volume: Goal: Ability to maintain a balanced intake and output will improve Outcome: Progressing   Problem: Nutritional: Goal: Maintenance of adequate nutrition will improve Outcome: Progressing   Problem: Skin Integrity: Goal: Risk for impaired skin integrity will decrease Outcome: Progressing   Problem: Tissue Perfusion: Goal: Adequacy of tissue perfusion will improve Outcome: Progressing   Problem: Activity: Goal: Risk for activity intolerance will decrease Outcome: Progressing   Problem: Coping: Goal: Level of anxiety will decrease Outcome: Progressing   Problem: Elimination: Goal: Will not experience complications related to urinary retention Outcome: Progressing   Problem: Pain Managment: Goal: General experience of comfort will improve and/or be controlled Outcome: Progressing

## 2024-06-13 NOTE — Anesthesia Postprocedure Evaluation (Signed)
 Anesthesia Post Note  Patient: Shirley Sullivan  Procedure(s) Performed: OPEN REDUCTION INTERNAL FIXATION (ORIF) TIBIA FRACTURE (Right)     Patient location during evaluation: PACU Anesthesia Type: General Level of consciousness: awake and alert Pain management: pain level controlled Vital Signs Assessment: post-procedure vital signs reviewed and stable Respiratory status: spontaneous breathing, nonlabored ventilation, respiratory function stable and patient connected to nasal cannula oxygen Cardiovascular status: blood pressure returned to baseline and stable Postop Assessment: no apparent nausea or vomiting Anesthetic complications: no   No notable events documented.  Last Vitals:  Vitals:   06/13/24 0455 06/13/24 0458  BP: (!) 148/70 (!) 148/70  Pulse: 82 79  Resp:  17  Temp:  36.6 C  SpO2: 100% 100%    Last Pain:  Vitals:   06/13/24 0605  TempSrc:   PainSc: 0-No pain                 Lynwood MARLA Cornea

## 2024-06-13 NOTE — Progress Notes (Signed)
     1 Day Post-Op Procedure(s) (LRB): OPEN REDUCTION INTERNAL FIXATION (ORIF) TIBIA FRACTURE (Right) Subjective:  Patient reports pain as moderate.  Sleep somewhat difficult due to pain.  No overnight events.  Denies chest pain, ShOB, N/V.  Denies numbness or tingling.  Reports feeling anxious of recovery; post-operative expectations discussed at length.  Patient interested in CIR, PT aware.  No other complaints at this time.  Objective:   VITALS:   Vitals:   06/13/24 0455 06/13/24 0458 06/13/24 0753 06/13/24 0753  BP: (!) 148/70 (!) 148/70 (!) 146/73 (!) 146/73  Pulse: 82 79 88 89  Resp:  17 16 16   Temp:  97.9 F (36.6 C) 98 F (36.7 C) 98 F (36.7 C)  TempSrc:   Oral Oral  SpO2: 100% 100% 99% 100%  Weight:      Height:        Sitting in chair, in NAD Neurologically intact Sensation intact distally Good perfusion distally, CRT <2, skin warm and dry Dorsiflexion/Plantar flexion intact Incision: dressing C/D/I -- limited visibility due to splint placement No significant swelling adjacent to proximal or distal aspect of splint Compartment soft Wiggles toes appropriately    Lab Results  Component Value Date   WBC 6.4 06/13/2024   HGB 11.6 (L) 06/13/2024   HCT 35.1 (L) 06/13/2024   MCV 87.3 06/13/2024   PLT 159 06/13/2024   BMET    Component Value Date/Time   NA 137 06/13/2024 0727   K 3.8 06/13/2024 0727   CL 104 06/13/2024 0727   CO2 25 06/13/2024 0727   GLUCOSE 131 (H) 06/13/2024 0727   BUN 10 06/13/2024 0727   CREATININE 0.79 06/13/2024 0727   CALCIUM 8.7 (L) 06/13/2024 0727   GFRNONAA >60 06/13/2024 0727     Xray: stable post-operative imaging  Assessment/Plan: 1 Day Post-Op   Principal Problem:   Closed fracture of right tibial plateau Active Problems:   Closed fracture of shaft of right fibula   Diabetes mellitus without complication (HCC)   Hypertension   Class 1 obesity   Procedure(s) (LRB): OPEN REDUCTION INTERNAL FIXATION (ORIF)  TIBIA FRACTURE (Right)   Narrative: 06/13/24:   Mild likely post-operative anemia, HDS, continue to monitor.  Continue NWB status.  Splint was reportedly too tight, was readjusted by nursing staff and appears fitting okay.  Mobilize with PT.  CIR eval pending.   Plan: Up with therapy Incentive Spirometry Elevate and Apply ice  Weightbearing: NWB RLE ROM/Precautions:  Restricted ROM of RLE, specifically ankle Mobility: Continue with PT/OT Insicional and dressing care: Reinforce dressings PRN, keep splint dry Pain management: multimodal  Antibiotics: Periop abx Diet: Advance as tolerated VTE prophylaxis: Lovenox  in house, ASA 325mg  daily x 4 weeks outpatient Dispo: per medicine.  PT placing recommendation of CIR, pending eval.  Likely SNF vs CIR. Follow - up plan: follow up in office   with Dr. Kendal Hila CHRISTELLA Mclean 06/13/2024, 9:36 AM   Contact information:   Tzzxijbd 7am-5pm epic message Dr. Edna, or call office for patient follow up: [phone] After hours and holidays please check [email or web address] for group call information for [facility]

## 2024-06-13 NOTE — Evaluation (Signed)
 Occupational Therapy Evaluation Patient Details Name: Shirley Sullivan MRN: [id number] DOB: February 06, 1961 Today's Date: 06/13/2024   History of Present Illness   Pt is a 63 y/o F presenting to ED on 7/10 after fall with R knee pain, found to have R medial tibial plateau fx, s/p ORIF on 7/11. PMH includes DM2, HTN, nephrolithaisis, lupus, depression, anxiety.     Clinical Impressions Pt ind at baseline with ADLs/functional mobility, lives with her spouse and adult son who can assist at d/c. Pt currently needing min-max A for ADLs, mod A for bed mobility and mod+2 for transfers with RW. Pt able to pivot L foot to transfer bed > chair. Pt presenting with impairments listed below, will follow acutely. Patient will benefit from intensive inpatient follow-up therapy, >3 hours/day to maximize safety/ind with ADL/functional mobility.      If plan is discharge home, recommend the following:   Two people to help with walking and/or transfers;A lot of help with bathing/dressing/bathroom;Assistance with cooking/housework;Direct supervision/assist for medications management;Direct supervision/assist for financial management;Help with stairs or ramp for entrance;Assist for transportation     Functional Status Assessment   Patient has had a recent decline in their functional status and demonstrates the ability to make significant improvements in function in a reasonable and predictable amount of time.     Equipment Recommendations   Wheelchair (measurements OT);Wheelchair cushion (measurements OT)     Recommendations for Other Services   PT consult;Rehab consult     Precautions/Restrictions   Precautions Precautions: Fall Restrictions Weight Bearing Restrictions Per Provider Order: Yes Other Position/Activity Restrictions: NWB RLE     Mobility Bed Mobility Overal bed mobility: Needs Assistance Bed Mobility: Supine to Sit     Supine to sit: Mod assist     General bed mobility  comments: assisting RLE to EOB    Transfers Overall transfer level: Needs assistance Equipment used: Rolling walker (2 wheels) Transfers: Sit to/from Stand, Bed to chair/wheelchair/BSC Sit to Stand: Mod assist, +2 physical assistance Stand pivot transfers: Mod assist, +2 physical assistance                Balance Overall balance assessment: Needs assistance Sitting-balance support: Feet supported Sitting balance-Leahy Scale: Good     Standing balance support: During functional activity, Reliant on assistive device for balance Standing balance-Leahy Scale: Poor                             ADL either performed or assessed with clinical judgement   ADL Overall ADL's : Needs assistance/impaired Eating/Feeding: Set up;Sitting   Grooming: Sitting;Minimal assistance;Brushing hair   Upper Body Bathing: Moderate assistance   Lower Body Bathing: Maximal assistance   Upper Body Dressing : Minimal assistance   Lower Body Dressing: Maximal assistance   Toilet Transfer: Moderate assistance;+2 for physical assistance   Toileting- Clothing Manipulation and Hygiene: Maximal assistance       Functional mobility during ADLs: Moderate assistance;+2 for physical assistance;Rolling walker (2 wheels)       Vision   Vision Assessment?: No apparent visual deficits     Perception Perception: Not tested       Praxis Praxis: Not tested       Pertinent Vitals/Pain Pain Assessment Pain Assessment: Faces Pain Score: 7  Faces Pain Scale: Hurts whole lot Pain Location: R knee and below Pain Descriptors / Indicators: Discomfort Pain Intervention(s): Limited activity within patient's tolerance, Monitored during session, Repositioned, Patient requesting pain meds-RN notified  Extremity/Trunk Assessment Upper Extremity Assessment Upper Extremity Assessment: Generalized weakness   Lower Extremity Assessment Lower Extremity Assessment: Defer to PT evaluation        Communication Communication Communication: No apparent difficulties   Cognition Arousal: Alert Behavior During Therapy: WFL for tasks assessed/performed, Anxious Cognition: No apparent impairments                               Following commands: Intact       Cueing  General Comments   Cueing Techniques: Verbal cues  VSS   Exercises     Shoulder Instructions      Home Living Family/patient expects to be discharged to:: Private residence Living Arrangements: Spouse/significant other;Children (son) Available Help at Discharge: Family;Available 24 hours/day Type of Home: House Home Access: Stairs to enter [employer] of Steps: 2 Entrance Stairs-Rails: Right Home Layout: One level     Bathroom Shower/Tub: Chief Strategy Officer: Standard     Home Equipment: Engineer, agricultural (2 wheels);Transport chair;Shower seat;BSC/3in1          Prior Functioning/Environment Prior Level of Function : Needs assist             Mobility Comments: no AD use ADLs Comments: ind    OT Problem List: Decreased strength;Decreased range of motion;Decreased activity tolerance;Decreased safety awareness;Impaired balance (sitting and/or standing)   OT Treatment/Interventions: Self-care/ADL training;Therapeutic exercise;Energy conservation;DME and/or AE instruction;Therapeutic activities;Patient/family education;Balance training      OT Goals(Current goals can be found in the care plan section)   Acute Rehab OT Goals Patient Stated Goal: to get better OT Goal Formulation: With patient Time For Goal Achievement: 06/27/24 Potential to Achieve Goals: Good ADL Goals Pt Will Perform Upper Body Dressing: with contact guard assist;sitting Pt Will Perform Lower Body Dressing: with mod assist;sitting/lateral leans;with adaptive equipment;sit to/from stand Pt Will Transfer to Toilet: with min assist;stand pivot transfer;bedside  commode Additional ADL Goal #2: pt will perform bed mobility min A in prep for ADLs   OT Frequency:  Min 2X/week    Co-evaluation              AM-PAC OT 6 Clicks Daily Activity     Outcome Measure Help from another person eating meals?: A Little Help from another person taking care of personal grooming?: A Little Help from another person toileting, which includes using toliet, bedpan, or urinal?: A Lot Help from another person bathing (including washing, rinsing, drying)?: A Lot Help from another person to put on and taking off regular upper body clothing?: A Little Help from another person to put on and taking off regular lower body clothing?: A Lot 6 Click Score: 15   End of Session Equipment Utilized During Treatment: Gait belt;Rolling walker (2 wheels) Nurse Communication: Mobility status  Activity Tolerance: Patient tolerated treatment well Patient left: in chair;with call bell/phone within reach;with chair alarm set;with family/visitor present  OT Visit Diagnosis: Unsteadiness on feet (R26.81);Other abnormalities of gait and mobility (R26.89);Muscle weakness (generalized) (M62.81)                Time: 8954-8881 OT Time Calculation (min): 33 min Charges:  OT General Charges $OT Visit: 1 Visit OT Evaluation $OT Eval Moderate Complexity: 1 Mod OT Treatments $Therapeutic Activity: 8-22 mins  Kharter Brew K, OTD, OTR/L SecureChat Preferred Acute Rehab [phone]   Laneta MARLA Pereyra 06/13/2024, 12:43 PM

## 2024-06-14 DIAGNOSIS — W19XXXA Unspecified fall, initial encounter: Secondary | ICD-10-CM | POA: Diagnosis not present

## 2024-06-14 DIAGNOSIS — S82451A Displaced comminuted fracture of shaft of right fibula, initial encounter for closed fracture: Secondary | ICD-10-CM | POA: Diagnosis not present

## 2024-06-14 DIAGNOSIS — S82141A Displaced bicondylar fracture of right tibia, initial encounter for closed fracture: Secondary | ICD-10-CM | POA: Diagnosis not present

## 2024-06-14 LAB — BASIC METABOLIC PANEL WITH GFR
Anion gap: 8 (ref 5–15)
BUN: 10 mg/dL (ref 8–23)
CO2: 27 mmol/L (ref 22–32)
Calcium: 8.9 mg/dL (ref 8.9–10.3)
Chloride: 102 mmol/L (ref 98–111)
Creatinine, Ser: 0.62 mg/dL (ref 0.44–1.00)
GFR, Estimated: >60 mL/min (ref >60)
Glucose, Bld: 149 mg/dL — ABNORMAL HIGH (ref 70–99)
Potassium: 3.6 mmol/L (ref 3.5–5.1)
Sodium: 137 mmol/L (ref 135–145)

## 2024-06-14 LAB — CBC
HCT: 32.9 % — ABNORMAL LOW (ref 36.0–46.0)
Hemoglobin: 10.9 g/dL — ABNORMAL LOW (ref 12.0–15.0)
MCH: 29.3 pg (ref 26.0–34.0)
MCHC: 33.1 g/dL (ref 30.0–36.0)
MCV: 88.4 fL (ref 80.0–100.0)
Platelets: 139 K/uL — ABNORMAL LOW (ref 150–400)
RBC: 3.72 MIL/uL — ABNORMAL LOW (ref 3.87–5.11)
RDW: 14.6 % (ref 11.5–15.5)
WBC: 4.7 K/uL (ref 4.0–10.5)
nRBC: 0 % (ref 0.0–0.2)

## 2024-06-14 LAB — GLUCOSE, CAPILLARY
Glucose-Capillary: 121 mg/dL — ABNORMAL HIGH (ref 70–99)
Glucose-Capillary: 130 mg/dL — ABNORMAL HIGH (ref 70–99)
Glucose-Capillary: 134 mg/dL — ABNORMAL HIGH (ref 70–99)
Glucose-Capillary: 138 mg/dL — ABNORMAL HIGH (ref 70–99)
Glucose-Capillary: 148 mg/dL — ABNORMAL HIGH (ref 70–99)
Glucose-Capillary: 168 mg/dL — ABNORMAL HIGH (ref 70–99)

## 2024-06-14 MED ORDER — CLOTRIMAZOLE 1 % EX CREA
TOPICAL_CREAM | Freq: Two times a day (BID) | CUTANEOUS | Status: DC
  Filled 2024-06-14: qty 15

## 2024-06-14 MED ORDER — CLOTRIMAZOLE 1 % VA CREA
1.0000 | TOPICAL_CREAM | Freq: Every day | VAGINAL | Status: DC
  Administered 2024-06-14: 1 via VAGINAL
  Filled 2024-06-14: qty 45

## 2024-06-14 NOTE — Plan of Care (Signed)
    Problem: Coping: Goal: Ability to adjust to condition or change in health will improve 06/14/2024 0525 by Louvella Greig BROCKS, RN Outcome: Progressing 06/14/2024 0525 by Louvella Greig BROCKS, RN Outcome: Progressing   Problem: Activity: Goal: Risk for activity intolerance will decrease 06/14/2024 0525 by Louvella Greig BROCKS, RN Outcome: Progressing 06/14/2024 0525 by Louvella Greig BROCKS, RN Outcome: Progressing   Problem: Safety: Goal: Ability to remain free from injury will improve 06/14/2024 0525 by Browning Southwood C, RN Outcome: Progressing 06/14/2024 0525 by Louvella Greig BROCKS, RN Outcome: Progressing   Problem: Pain Managment: Goal: General experience of comfort will improve and/or be controlled 06/14/2024 0525 by Louvella Greig BROCKS, RN Outcome: Progressing 06/14/2024 0525 by Louvella Greig BROCKS, RN Outcome: Progressing

## 2024-06-14 NOTE — Evaluation (Signed)
 Physical Therapy Evaluation Patient Details Name: Shirley Sullivan MRN: [id number] DOB: 1961-11-15 Today's Date: 06/14/2024  History of Present Illness  Pt is a 63 y/o F presenting to ED on 7/10 after fall with R knee pain, found to have R medial tibial plateau fx, s/p ORIF on 7/11. PMH includes DM2, HTN, nephrolithaisis, lupus, depression, anxiety.  Clinical Impression  Pt admitted with above diagnosis. Lives at home with family, in a single-level home with a few steps to enter; Prior to admission, pt was independent, enjoys spending time with family; Presents to PT with a functional decline compared to baseline independence; Pt has weight bearing restrictions RLE, decr single limb standing tolerance, functional weakness; Needed +2 assist with sit to stand and pivot transfers with OT yesterday; Opted to perform lateral scooting as another option for functional transfers today; needed Light mod assist to lateral scoot bed to recliner chair on her R, with good use of LLE for a pivot point and for better weight shifting during transfer; Pt has a supportive family and husband, and is motivated to improve her independence and get back home; Highly recommend AIR for post-acute rehab to maximize independence and safety with mobility and ADLs; Pt currently with functional limitations due to the deficits listed below (see PT Problem List). Pt will benefit from skilled PT to increase their independence and safety with mobility to allow discharge to the venue listed below.           If plan is discharge home, recommend the following: A little help with walking and/or transfers;A little help with bathing/dressing/bathroom;Assist for transportation;Help with stairs or ramp for entrance   Can travel by private vehicle        Equipment Recommendations Wheelchair (measurements PT);Wheelchair cushion (measurements PT);Hospital bed  Recommendations for Other Services       Functional Status Assessment Patient has  had a recent decline in their functional status and demonstrates the ability to make significant improvements in function in a reasonable and predictable amount of time.     Precautions / Restrictions Precautions Precautions: Fall Restrictions RLE Weight Bearing Per Provider Order: Non weight bearing      Mobility  Bed Mobility Overal bed mobility: Needs Assistance Bed Mobility: Supine to Sit     Supine to sit: Mod assist     General bed mobility comments: Step-by-step cues for technique; pt assisting RLE to EOB with use of gait belt loop; good use of LLE for half-bridging    Transfers Overall transfer level: Needs assistance   Transfers: Bed to chair/wheelchair/BSC            Lateral/Scoot Transfers: Mod assist General transfer comment: Opted to work on lateral scoot transfer bed to recliner for another option fro transfers; step-by-step cues for technique; good use of LLE as a pivot point; will consider sliding board    Ambulation/Gait                  Stairs            Wheelchair Mobility     Tilt Bed    Modified Rankin (Stroke Patients Only)       Balance     Sitting balance-Leahy Scale: Good                                       Pertinent Vitals/Pain Pain Assessment Pain Assessment: 0-10 Pain Score: 6  Pain Location: R knee and below Pain Descriptors / Indicators: Discomfort Pain Intervention(s): Monitored during session    Home Living Family/patient expects to be discharged to:: Private residence Living Arrangements: Spouse/significant other;Children (son) Available Help at Discharge: Family;Available 24 hours/day Type of Home: House Home Access: Stairs to enter Entrance Stairs-Rails: Right Entrance Stairs-Number of Steps: 2   Home Layout: One level Home Equipment: Engineer, agricultural (2 wheels);Transport chair;Shower seat;BSC/3in1      Prior Function Prior Level of Function : Needs assist              Mobility Comments: no AD use ADLs Comments: ind     Extremity/Trunk Assessment   Upper Extremity Assessment Upper Extremity Assessment: Defer to OT evaluation    Lower Extremity Assessment Lower Extremity Assessment: RLE deficits/detail RLE Deficits / Details: able to actively flex and extend knee, though short ranges, and difficulty extending against gravity       Communication   Communication Communication: No apparent difficulties    Cognition Arousal: Alert Behavior During Therapy: WFL for tasks assessed/performed, Anxious                             Following commands: Intact       Cueing Cueing Techniques: Verbal cues     General Comments General comments (skin integrity, edema, etc.): NAD on room air; husband present and helpful    Exercises     Assessment/Plan    PT Assessment Patient needs continued PT services  PT Problem List Decreased strength;Decreased range of motion;Decreased activity tolerance;Decreased balance;Decreased mobility;Decreased knowledge of use of DME;Decreased safety awareness;Decreased coordination;Decreased knowledge of precautions;Pain       PT Treatment Interventions DME instruction;Gait training;Stair training;Functional mobility training;Therapeutic activities;Therapeutic exercise;Balance training;Neuromuscular re-education;Cognitive remediation;Patient/family education;Wheelchair mobility training;Manual techniques    PT Goals (Current goals can be found in the Care Plan section)  Acute Rehab PT Goals Patient Stated Goal: Be able to manage NWB RLE more independently PT Goal Formulation: With patient Time For Goal Achievement: 06/28/24 Potential to Achieve Goals: Good    Frequency Min 2X/week     Co-evaluation               AM-PAC PT 6 Clicks Mobility  Outcome Measure Help needed turning from your back to your side while in a flat bed without using bedrails?: A Little Help needed moving from  lying on your back to sitting on the side of a flat bed without using bedrails?: A Little Help needed moving to and from a bed to a chair (including a wheelchair)?: A Lot Help needed standing up from a chair using your arms (e.g., wheelchair or bedside chair)?: A Lot Help needed to walk in hospital room?: Total Help needed climbing 3-5 steps with a railing? : Total 6 Click Score: 12    End of Session Equipment Utilized During Treatment: Gait belt Activity Tolerance: Patient tolerated treatment well Patient left: in chair;with call bell/phone within reach;with family/visitor present Nurse Communication: Mobility status PT Visit Diagnosis: Unsteadiness on feet (R26.81);History of falling (Z91.81);Muscle weakness (generalized) (M62.81);Other abnormalities of gait and mobility (R26.89);Difficulty in walking, not elsewhere classified (R26.2)    Time: 8748-8658 PT Time Calculation (min) (ACUTE ONLY): 50 min   Charges:   PT Evaluation $PT Eval Moderate Complexity: 1 Mod PT Treatments $Therapeutic Activity: 23-37 mins PT General Charges $$ ACUTE PT VISIT: 1 Visit         Silvano Currier, PT  Acute Rehabilitation  Services Office [phone] Secure Chat welcomed   Silvano VEAR Currier 06/14/2024, 3:20 PM

## 2024-06-14 NOTE — Progress Notes (Signed)
     2 Days Post-Op Procedure(s) (LRB): OPEN REDUCTION INTERNAL FIXATION (ORIF) TIBIA FRACTURE (Right) Subjective:  Patient reports pain as improved now that she has had q 4 hour pain medication and is comfortable asking for it. Patient has baseline nausea and uses zofran  at home, continuing to use prn here.  Patient interested in CIR, PT aware.  No other complaints at this time.  Objective:   VITALS:   Vitals:   06/13/24 1611 06/13/24 2022 06/14/24 0518 06/14/24 0617  BP: 133/61 (!) 148/70 (!) 145/70 (P) 137/76  Pulse: 95 (!) 109 (!) 111 (!) (P) 106  Resp: 16 16 16    Temp: 98.6 F (37 C) 98.6 F (37 C) 99.1 F (37.3 C) (P) 99 F (37.2 C)  TempSrc: Oral Oral Oral (P) Oral  SpO2: 99% 98% 96% (P) 98%  Weight:      Height:        Laying in bed, in NAD Neurologically intact Sensation intact distally 2+ DP pulse Dorsiflexion/Plantar flexion intact Incision: dressing C/D/I  No significant swelling adjacent to proximal or distal aspect of splint Compartment soft Wiggles toes appropriately    Lab Results  Component Value Date   WBC 6.4 06/13/2024   HGB 11.6 (L) 06/13/2024   HCT 35.1 (L) 06/13/2024   MCV 87.3 06/13/2024   PLT 159 06/13/2024   BMET    Component Value Date/Time   NA 137 06/13/2024 0727   K 3.8 06/13/2024 0727   CL 104 06/13/2024 0727   CO2 25 06/13/2024 0727   GLUCOSE 131 (H) 06/13/2024 0727   BUN 10 06/13/2024 0727   CREATININE 0.79 06/13/2024 0727   CALCIUM 8.7 (L) 06/13/2024 0727   GFRNONAA >60 06/13/2024 0727     Xray: stable post-operative imaging  Assessment/Plan: 2 Days Post-Op   Principal Problem:   Closed fracture of right tibial plateau Active Problems:   Closed fracture of shaft of right fibula   Diabetes mellitus without complication (HCC)   Hypertension   Class 1 obesity   Procedure(s) (LRB): OPEN REDUCTION INTERNAL FIXATION (ORIF) TIBIA FRACTURE (Right)   Plan: Up with therapy Incentive Spirometry Elevate and  Apply ice  Weightbearing: NWB RLE ROM/Precautions:  Restricted ROM of RLE, specifically ankle Mobility: Continue with PT/OT Insicional and dressing care: Reinforce dressings PRN, keep splint dry Pain management: multimodal  Antibiotics: Periop abx Diet: Advance as tolerated VTE prophylaxis: Lovenox  in house, ASA 325mg  daily x 4 weeks outpatient Dispo: patient interested in CIR, PT eval to happen today Follow - up plan: follow up in office  with Dr. Kendal Army MARLA Delores 06/14/2024, 7:31 AM

## 2024-06-14 NOTE — Plan of Care (Signed)
  Problem: Education: Goal: Ability to describe self-care measures that may prevent or decrease complications (Diabetes Survival Skills Education) will improve Outcome: Progressing   Problem: Skin Integrity: Goal: Risk for impaired skin integrity will decrease Outcome: Progressing   Problem: Tissue Perfusion: Goal: Adequacy of tissue perfusion will improve Outcome: Progressing   Problem: Activity: Goal: Risk for activity intolerance will decrease Outcome: Progressing   Problem: Nutrition: Goal: Adequate nutrition will be maintained Outcome: Progressing   Problem: Coping: Goal: Level of anxiety will decrease Outcome: Progressing

## 2024-06-14 NOTE — Progress Notes (Signed)
 PROGRESS NOTE    Shirley Sullivan  FMW:[id number] DOB: [birth year] DOA: 06/11/2024 PCP: Montey Presto, MD  Chief Complaint  Patient presents with   Fall    Brief Narrative:   Shirley Sullivan is Shirley Sullivan 63 y.o. female with medical history significant for hypertension, type 2 diabetes mellitus, nephrolithiasis, lupus in remission, depression, and anxiety who presents with right knee pain and swelling after Shirley Sullivan fall.   Admitted for R tib/fib fracture, now s/p ORIF by orthopedics.   Assessment & Plan:   Principal Problem:   Closed fracture of right tibial plateau Active Problems:   Closed fracture of shaft of right fibula   Diabetes mellitus without complication (HCC)   Hypertension   Class 1 obesity  1. Right tibia and fibula fractures  - CT showed comminuted fractures of the proximal tibial metaphysis with extension to the medial tibial plateau, comminuted transverse fractures of the proximal fibular shaft - s/p ORIF R proximal tibia fracture - post op care by ortho trauma, touchdown weightbearing to RLE, lovenox  for DVT ppx (aspirin  on discharge), PT/OT - she's interested in CIR - pain management, bowel regimen   2. Hypertension  - Continue losartan      3. Type II DM  Peripheral Neuropathy - Check CBGs and use low-intensity SSI for now   - A1c 5.8 - holding home metformin  - gabapentin    # Osteoporosis - taking alendronate weekly  # Vitamin D  Deficiency - replace, follow outpatient  # Depression - zoloft      DVT prophylaxis: lovenox  Code Status: full Family Communication: none Disposition:   Status is: Inpatient Remains inpatient appropriate because: need for further post op care, pain control   Consultants:  orthopedics  Procedures:  7/12 Open reduction internal fixation of right proximal tibia fracture   Antimicrobials:  Anti-infectives (From admission, onward)    Start     Dose/Rate Route Frequency Ordered Stop   06/12/24 2200  ceFAZolin  (ANCEF )  IVPB 2g/100 mL premix        2 g 200 mL/hr over 30 Minutes Intravenous Every 8 hours 06/12/24 1851 06/13/24 1530   06/12/24 1618  vancomycin  (VANCOCIN ) powder  Status:  Discontinued          As needed 06/12/24 1618 06/12/24 1632   06/12/24 1415  ceFAZolin  (ANCEF ) IVPB 2g/100 mL premix        2 g 200 mL/hr over 30 Minutes Intravenous On call to O.R. 06/12/24 1357 06/12/24 1522       Subjective: No new complaints Husband at bedside Intersted in CIR  Objective: Vitals:   06/13/24 2022 06/14/24 0518 06/14/24 0617 06/14/24 1210  BP: (!) 148/70 (!) 145/70 (P) 137/76 (!) 149/73  Pulse: (!) 109 (!) 111 (!) (P) 106 96  Resp: 16 16  18   Temp: 98.6 F (37 C) 99.1 F (37.3 C) (P) 99 F (37.2 C)   TempSrc: Oral Oral (P) Oral   SpO2: 98% 96% (P) 98% 100%  Weight:      Height:        Intake/Output Summary (Last 24 hours) at 06/14/2024 1258 Last data filed at 06/14/2024 1209 Gross per 24 hour  Intake 660 ml  Output 1350 ml  Net -690 ml   Filed Weights   06/12/24 1337  Weight: 83.9 kg    Examination:  General: No acute distress. Cardiovascular: RRR Lungs: unlabored Neurological: Alert and oriented 3. Moves all extremities 4 . Cranial nerves II through XII grossly intact. Skin: Warm and dry. No rashes  or lesions. Extremities: RLE splint   Data Reviewed: I have personally reviewed following labs and imaging studies  CBC: Recent Labs  Lab 06/12/24 0215 06/12/24 0227 06/12/24 0601 06/12/24 1937 06/13/24 0727 06/14/24 0859  WBC 7.6  --  6.3 7.3 6.4 4.7  NEUTROABS 4.7  --   --   --   --   --   HGB 13.6 13.9 12.9 12.4 11.6* 10.9*  HCT 40.8 41.0 39.0 37.6 35.1* 32.9*  MCV 85.7  --  87.1 87.2 87.3 88.4  PLT 186  --  176 148* 159 139*    Basic Metabolic Panel: Recent Labs  Lab 06/12/24 0227 06/12/24 0601 06/12/24 1937 06/13/24 0727 06/14/24 0859  NA 138 136  --  137 137  K 4.1 3.9  --  3.8 3.6  CL 106 104  --  104 102  CO2  --  25  --  25 27  GLUCOSE 126*  138*  --  131* 149*  BUN 22 23  --  10 10  CREATININE 0.70 0.78 0.75 0.79 0.62  CALCIUM  --  9.1  --  8.7* 8.9    GFR: Estimated Creatinine Clearance: 73.5 mL/min (by C-G formula based on SCr of 0.62 mg/dL).  Liver Function Tests: No results for input(s): AST, ALT, ALKPHOS, BILITOT, PROT, ALBUMIN in the last 168 hours.  CBG: Recent Labs  Lab 06/13/24 2241 06/14/24 0034 06/14/24 0516 06/14/24 0851 06/14/24 1207  GLUCAP 150* 130* 138* 134* 121*     Recent Results (from the past 240 hours)  Surgical pcr screen     Status: None   Collection Time: 06/12/24  1:47 PM   Specimen: Nasal Mucosa; Nasal Swab  Result Value Ref Range Status   MRSA, PCR NEGATIVE NEGATIVE Final   Staphylococcus aureus NEGATIVE NEGATIVE Final    Comment: (NOTE) The Xpert SA Assay (FDA approved for NASAL specimens in patients 33 years of age and older), is one component of Shirley Sullivan comprehensive surveillance program. It is not intended to diagnose infection nor to guide or monitor treatment. Performed at [facility], [address], [place], [place] [address]          Radiology Studies: DG Knee Right Port Result Date: 06/12/2024 CLINICAL DATA:  Fracture, postop. EXAM: PORTABLE RIGHT KNEE - 1-2 VIEW COMPARISON:  Preoperative imaging FINDINGS: Lateral plate and screw fixation of proximal tibial fracture. Unchanged fracture alignment. Proximal fibular fracture is in unchanged alignment. Recent postsurgical change includes air and edema in the soft tissues. IMPRESSION: ORIF of proximal tibial fracture without immediate postoperative complication. Electronically Signed   By: Andrea Gasman M.D.   On: 06/12/2024 18:06   DG Tibia/Fibula Right Result Date: 06/12/2024 CLINICAL DATA:  Elective surgery. EXAM: RIGHT TIBIA AND FIBULA - 2 VIEW COMPARISON:  Preoperative imaging FINDINGS: Five fluoroscopic spot views of the right proximal tibia and fibula submitted from the operating room. Lateral plate  and screw fixation of proximal tibial fracture. Again seen proximal fibular fracture. Fluoroscopy time 56.9 seconds. Dose 2.39 mGy. IMPRESSION: Intraoperative fluoroscopy during proximal tibial fracture fixation. Electronically Signed   By: Andrea Gasman M.D.   On: 06/12/2024 18:05   DG C-Arm 1-60 Min-No Report Result Date: 06/12/2024 Fluoroscopy was utilized by the requesting physician.  No radiographic interpretation.        Scheduled Meds:  acetaminophen   1,000 mg Oral Q8H   clotrimazole   1 Applicatorful Vaginal QHS   clotrimazole    Topical BID   docusate sodium   100  mg Oral BID   enoxaparin  (LOVENOX ) injection  40 mg Subcutaneous Q24H   gabapentin   400 mg Oral TID   insulin  aspart  0-6 Units Subcutaneous Q4H   loratadine   10 mg Oral Daily   losartan   25 mg Oral Daily   polyethylene glycol  17 g Oral BID   sertraline   100 mg Oral QHS   sodium chloride  flush  3 mL Intravenous Q12H   Vitamin D  (Ergocalciferol )  50,000 Units Oral Q7 days   Continuous Infusions:     LOS: 2 days    Time spent: over 30 min    Meliton Monte, MD [facility]   To contact the attending provider between 7A-7P or the covering provider during after hours 7P-7A, please log into the web site [email or web address] and access using universal [facility] password for that web site. If you do not have the password, please call the hospital operator.  06/14/2024, 12:58 PM

## 2024-06-15 ENCOUNTER — Other Ambulatory Visit: Payer: Self-pay

## 2024-06-15 ENCOUNTER — Inpatient Hospital Stay (HOSPITAL_COMMUNITY)
Admission: AD | Admit: 2024-06-15 | Discharge: 2024-06-26 | DRG: 560 | Disposition: A | Discharge status: Home Health | Source: Intra-hospital | Attending: Physical Medicine & Rehabilitation | Admitting: Physical Medicine & Rehabilitation

## 2024-06-15 ENCOUNTER — Encounter (HOSPITAL_COMMUNITY): Payer: Self-pay | Admitting: Physical Medicine & Rehabilitation

## 2024-06-15 ENCOUNTER — Inpatient Hospital Stay (HOSPITAL_COMMUNITY)

## 2024-06-15 DIAGNOSIS — G8918 Other acute postprocedural pain: Secondary | ICD-10-CM | POA: Diagnosis not present

## 2024-06-15 DIAGNOSIS — E1142 Type 2 diabetes mellitus with diabetic polyneuropathy: Secondary | ICD-10-CM | POA: Diagnosis present

## 2024-06-15 DIAGNOSIS — F329 Major depressive disorder, single episode, unspecified: Secondary | ICD-10-CM | POA: Diagnosis present

## 2024-06-15 DIAGNOSIS — W1839XD Other fall on same level, subsequent encounter: Secondary | ICD-10-CM | POA: Diagnosis not present

## 2024-06-15 DIAGNOSIS — J45909 Unspecified asthma, uncomplicated: Secondary | ICD-10-CM | POA: Diagnosis present

## 2024-06-15 DIAGNOSIS — S82101D Unspecified fracture of upper end of right tibia, subsequent encounter for closed fracture with routine healing: Principal | ICD-10-CM

## 2024-06-15 DIAGNOSIS — M329 Systemic lupus erythematosus, unspecified: Secondary | ICD-10-CM | POA: Diagnosis present

## 2024-06-15 DIAGNOSIS — R42 Dizziness and giddiness: Secondary | ICD-10-CM | POA: Diagnosis not present

## 2024-06-15 DIAGNOSIS — F418 Other specified anxiety disorders: Secondary | ICD-10-CM | POA: Diagnosis not present

## 2024-06-15 DIAGNOSIS — R600 Localized edema: Secondary | ICD-10-CM | POA: Diagnosis not present

## 2024-06-15 DIAGNOSIS — S82451A Displaced comminuted fracture of shaft of right fibula, initial encounter for closed fracture: Secondary | ICD-10-CM | POA: Diagnosis not present

## 2024-06-15 DIAGNOSIS — S82201A Unspecified fracture of shaft of right tibia, initial encounter for closed fracture: Principal | ICD-10-CM | POA: Diagnosis present

## 2024-06-15 DIAGNOSIS — Z7983 Long term (current) use of bisphosphonates: Secondary | ICD-10-CM | POA: Diagnosis not present

## 2024-06-15 DIAGNOSIS — I10 Essential (primary) hypertension: Secondary | ICD-10-CM | POA: Diagnosis present

## 2024-06-15 DIAGNOSIS — Z90710 Acquired absence of both cervix and uterus: Secondary | ICD-10-CM

## 2024-06-15 DIAGNOSIS — R748 Abnormal levels of other serum enzymes: Secondary | ICD-10-CM | POA: Diagnosis not present

## 2024-06-15 DIAGNOSIS — L089 Local infection of the skin and subcutaneous tissue, unspecified: Secondary | ICD-10-CM | POA: Diagnosis not present

## 2024-06-15 DIAGNOSIS — Z7982 Long term (current) use of aspirin: Secondary | ICD-10-CM

## 2024-06-15 DIAGNOSIS — Z79899 Other long term (current) drug therapy: Secondary | ICD-10-CM

## 2024-06-15 DIAGNOSIS — D62 Acute posthemorrhagic anemia: Secondary | ICD-10-CM | POA: Diagnosis present

## 2024-06-15 DIAGNOSIS — F419 Anxiety disorder, unspecified: Secondary | ICD-10-CM | POA: Diagnosis present

## 2024-06-15 DIAGNOSIS — S82101S Unspecified fracture of upper end of right tibia, sequela: Secondary | ICD-10-CM | POA: Diagnosis not present

## 2024-06-15 DIAGNOSIS — M7989 Other specified soft tissue disorders: Secondary | ICD-10-CM

## 2024-06-15 DIAGNOSIS — Z8616 Personal history of COVID-19: Secondary | ICD-10-CM

## 2024-06-15 DIAGNOSIS — Z7984 Long term (current) use of oral hypoglycemic drugs: Secondary | ICD-10-CM | POA: Diagnosis not present

## 2024-06-15 DIAGNOSIS — Z83438 Family history of other disorder of lipoprotein metabolism and other lipidemia: Secondary | ICD-10-CM | POA: Diagnosis not present

## 2024-06-15 DIAGNOSIS — Z794 Long term (current) use of insulin: Secondary | ICD-10-CM

## 2024-06-15 DIAGNOSIS — L2989 Other pruritus: Secondary | ICD-10-CM | POA: Diagnosis not present

## 2024-06-15 DIAGNOSIS — I951 Orthostatic hypotension: Secondary | ICD-10-CM | POA: Diagnosis present

## 2024-06-15 DIAGNOSIS — R7401 Elevation of levels of liver transaminase levels: Secondary | ICD-10-CM | POA: Diagnosis present

## 2024-06-15 LAB — GLUCOSE, CAPILLARY
Glucose-Capillary: 125 mg/dL — ABNORMAL HIGH (ref 70–99)
Glucose-Capillary: 132 mg/dL — ABNORMAL HIGH (ref 70–99)
Glucose-Capillary: 133 mg/dL — ABNORMAL HIGH (ref 70–99)
Glucose-Capillary: 140 mg/dL — ABNORMAL HIGH (ref 70–99)
Glucose-Capillary: 143 mg/dL — ABNORMAL HIGH (ref 70–99)
Glucose-Capillary: 99 mg/dL (ref 70–99)

## 2024-06-15 LAB — BASIC METABOLIC PANEL WITH GFR
Anion gap: 8 (ref 5–15)
BUN: 11 mg/dL (ref 8–23)
CO2: 28 mmol/L (ref 22–32)
Calcium: 8.7 mg/dL — ABNORMAL LOW (ref 8.9–10.3)
Chloride: 102 mmol/L (ref 98–111)
Creatinine, Ser: 0.6 mg/dL (ref 0.44–1.00)
GFR, Estimated: >60 mL/min (ref >60)
Glucose, Bld: 142 mg/dL — ABNORMAL HIGH (ref 70–99)
Potassium: 3.5 mmol/L (ref 3.5–5.1)
Sodium: 138 mmol/L (ref 135–145)

## 2024-06-15 LAB — CBC
HCT: 31.4 % — ABNORMAL LOW (ref 36.0–46.0)
Hemoglobin: 10.5 g/dL — ABNORMAL LOW (ref 12.0–15.0)
MCH: 29.5 pg (ref 26.0–34.0)
MCHC: 33.4 g/dL (ref 30.0–36.0)
MCV: 88.2 fL (ref 80.0–100.0)
Platelets: 129 K/uL — ABNORMAL LOW (ref 150–400)
RBC: 3.56 MIL/uL — ABNORMAL LOW (ref 3.87–5.11)
RDW: 14.5 % (ref 11.5–15.5)
WBC: 3.9 K/uL — ABNORMAL LOW (ref 4.0–10.5)
nRBC: 0 % (ref 0.0–0.2)

## 2024-06-15 MED ORDER — ENOXAPARIN SODIUM 40 MG/0.4ML IJ SOSY
40.0000 mg | PREFILLED_SYRINGE | INTRAMUSCULAR | Status: DC
  Administered 2024-06-16 – 2024-06-26 (×11): 40 mg via SUBCUTANEOUS
  Filled 2024-06-15 (×11): qty 0.4

## 2024-06-15 MED ORDER — CARMEX CLASSIC LIP BALM EX OINT
TOPICAL_OINTMENT | CUTANEOUS | Status: DC | PRN
Start: 2024-06-15 — End: 2024-06-26

## 2024-06-15 MED ORDER — CLOTRIMAZOLE 1 % EX CREA
TOPICAL_CREAM | Freq: Two times a day (BID) | CUTANEOUS | Status: DC
  Filled 2024-06-15: qty 15

## 2024-06-15 MED ORDER — LORATADINE 10 MG PO TABS
10.0000 mg | ORAL_TABLET | Freq: Every day | ORAL | Status: DC
  Administered 2024-06-16 – 2024-06-26 (×11): 10 mg via ORAL
  Filled 2024-06-15 (×11): qty 1

## 2024-06-15 MED ORDER — GABAPENTIN 400 MG PO CAPS
400.0000 mg | ORAL_CAPSULE | Freq: Three times a day (TID) | ORAL | Status: DC
  Administered 2024-06-15 – 2024-06-26 (×33): 400 mg via ORAL
  Filled 2024-06-15 (×33): qty 1

## 2024-06-15 MED ORDER — ONDANSETRON 4 MG PO TBDP
4.0000 mg | ORAL_TABLET | Freq: Three times a day (TID) | ORAL | Status: DC | PRN
  Administered 2024-06-15 – 2024-06-25 (×13): 4 mg via ORAL
  Filled 2024-06-15 (×13): qty 1

## 2024-06-15 MED ORDER — POLYETHYLENE GLYCOL 3350 17 G PO PACK: 17.0000 g | PACK | Freq: Every day | ORAL | Status: DC | PRN

## 2024-06-15 MED ORDER — DOCUSATE SODIUM 100 MG PO CAPS
100.0000 mg | ORAL_CAPSULE | Freq: Two times a day (BID) | ORAL | Status: DC
  Administered 2024-06-17 – 2024-06-26 (×13): 100 mg via ORAL
  Filled 2024-06-15 (×21): qty 1

## 2024-06-15 MED ORDER — ENOXAPARIN SODIUM 40 MG/0.4ML IJ SOSY
40.0000 mg | PREFILLED_SYRINGE | INTRAMUSCULAR | Status: DC
Start: 2024-06-15 — End: 2024-06-15

## 2024-06-15 MED ORDER — METHOCARBAMOL 1000 MG/10ML IJ SOLN: 500.0000 mg | Freq: Four times a day (QID) | INTRAMUSCULAR | Status: DC | PRN

## 2024-06-15 MED ORDER — OXYCODONE HCL 5 MG PO TABS
7.5000 mg | ORAL_TABLET | ORAL | Status: DC | PRN
  Administered 2024-06-15 – 2024-06-21 (×4): 7.5 mg via ORAL
  Filled 2024-06-15 (×4): qty 2

## 2024-06-15 MED ORDER — ACETAMINOPHEN 325 MG PO TABS
325.0000 mg | ORAL_TABLET | ORAL | Status: DC | PRN
  Administered 2024-06-17 – 2024-06-25 (×7): 650 mg via ORAL
  Filled 2024-06-15 (×7): qty 2

## 2024-06-15 MED ORDER — SERTRALINE HCL 100 MG PO TABS
100.0000 mg | ORAL_TABLET | Freq: Every day | ORAL | Status: DC
  Administered 2024-06-15 – 2024-06-25 (×11): 100 mg via ORAL
  Filled 2024-06-15 (×11): qty 1

## 2024-06-15 MED ORDER — CLOTRIMAZOLE 1 % VA CREA
1.0000 | TOPICAL_CREAM | Freq: Every day | VAGINAL | Status: DC
  Administered 2024-06-16 – 2024-06-17 (×2): 1 via VAGINAL
  Filled 2024-06-15 (×2): qty 45

## 2024-06-15 MED ORDER — LOSARTAN POTASSIUM 25 MG PO TABS
25.0000 mg | ORAL_TABLET | Freq: Every day | ORAL | Status: DC
Start: 2024-06-16 — End: 2024-06-17
  Administered 2024-06-16 – 2024-06-17 (×2): 25 mg via ORAL
  Filled 2024-06-15 (×2): qty 1

## 2024-06-15 MED ORDER — INSULIN ASPART 100 UNIT/ML IJ SOLN: 0.0000 [IU] | INTRAMUSCULAR | Status: DC

## 2024-06-15 MED ORDER — VITAMIN D (ERGOCALCIFEROL) 1.25 MG (50000 UNIT) PO CAPS
50000.0000 [IU] | ORAL_CAPSULE | ORAL | Status: DC
  Administered 2024-06-20: 50000 [IU] via ORAL
  Filled 2024-06-15: qty 1

## 2024-06-15 MED ORDER — METHOCARBAMOL 500 MG PO TABS
500.0000 mg | ORAL_TABLET | Freq: Four times a day (QID) | ORAL | Status: DC | PRN
  Administered 2024-06-20 – 2024-06-23 (×5): 500 mg via ORAL
  Filled 2024-06-15 (×5): qty 1

## 2024-06-15 MED ORDER — OXYCODONE HCL 5 MG PO TABS
15.0000 mg | ORAL_TABLET | ORAL | Status: DC | PRN
  Administered 2024-06-16 – 2024-06-24 (×23): 15 mg via ORAL
  Filled 2024-06-15 (×23): qty 3

## 2024-06-15 MED ORDER — POLYETHYLENE GLYCOL 3350 17 G PO PACK
17.0000 g | PACK | Freq: Two times a day (BID) | ORAL | Status: DC
  Administered 2024-06-19 – 2024-06-26 (×8): 17 g via ORAL
  Filled 2024-06-15 (×17): qty 1

## 2024-06-15 NOTE — Progress Notes (Signed)
 PMR Admission Coordinator Pre-Admission Assessment   Patient: Shirley Sullivan is an 63 y.o., female MRN: [id number] DOB: 09/06/1961 Height: 5' 4 (162.6 cm) Weight: 83.9 kg   Insurance Information HMO:     PPO:      PCP:      IPA:      80/20:      OTHER:  PRIMARY: Medicare A & B      Policy#: [id number]      Subscriber: Patient   Eff. Date: 02/01/24     Deduct: $1676      Out of Pocket Max: does not have      Life Max: does not have CIR: 100%      SNF: Full 20 days Outpatient: 80%      Home Health: 100%       DME: 80%       SECONDARY: BCBS supplement      Policy#: [id number] Group #[id number]     Phone#: [phone]       The "Data Collection Information Summary" for patients in Inpatient Rehabilitation Facilities with attached "Privacy Act Statement-Health Care Records" was provided and verbally reviewed with: Patient and Family   Emergency Contact Information Contact Information       Name Relation Home Work [place] R Spouse [phone]   [phone]         Other Contacts   None on File        Current Medical History  Patient Admitting Diagnosis: R tibial plateau fracture History of Present Illness: Shirley Sullivan is a 63 year old right handed female with history of anxiety, asthma, depression, diabetes mellitus without complications, lupus and hypertension.  Per chart review patient lives with spouse and son.  1 level home 2 steps to entry.  Presented 06/12/2024 after mechanical fall landing on her right knee.  She had immediate pain.  X-rays showed a right.  Tibial plateau fracture.  Underwent ORIF of right proximal tibial fracture 06/12/2024 per Dr. Kendal.  Nonweightbearing right lower extremity.  She was cleared to begin Lovenox  for DVT prophylaxis.  Acute blood loss anemia 10.5.  Therapy evaluations completed due to patient's decreased functional mobility was admitted for a comprehensive rehab program.     Patient's medical record from Shirley Sullivan has been  reviewed by the rehabilitation admission coordinator and physician.   Past Medical History      Past Medical History:  Diagnosis Date   Allergy     Anxiety     Arthritis     Asthma      perfumes triggers attacks. Has inhalers for rescue   COVID-19 10/09/2020    and 08/15/20   Depression     Diabetes mellitus without complication (HCC)     History of kidney stones 1998, 2015   Lupus     Septic shock (HCC)      Klebsiella oxytocin and Enterobacter cloacae bacteremia/UTI          Has the patient had major surgery during 100 days prior to admission? Yes   Family History   family history includes Hyperlipidemia in her mother.   Current Medications  Current Medications    Current Facility-Administered Medications:    acetaminophen  (TYLENOL ) tablet 1,000 mg, 1,000 mg, Oral, Q8H, Perri DELENA Meliton Mickey., MD, 1,000 mg at 06/15/24 0525   clotrimazole  (GYNE-LOTRIMIN ) vaginal cream 1 Applicatorful, 1 Applicatorful, Vaginal, QHS, Perri DELENA Meliton Mickey., MD, 1 Applicatorful at 06/14/24 2123   clotrimazole  (LOTRIMIN ) 1 % cream, ,  Topical, BID, Perri DELENA Meliton Mickey., MD, Given at 06/15/24 [phone]   diphenhydrAMINE  (BENADRYL ) 12.5 MG/5ML elixir 12.5-25 mg, 12.5-25 mg, Oral, Q4H PRN, Danton Lauraine DELENA, PA-C   docusate sodium  (COLACE) capsule 100 mg, 100 mg, Oral, BID, Danton Lauraine DELENA, PA-C, 100 mg at 06/13/24 2109   enoxaparin  (LOVENOX ) injection 40 mg, 40 mg, Subcutaneous, Q24H, Danton Lauraine DELENA, PA-C, 40 mg at 06/15/24 9152   gabapentin  (NEURONTIN ) capsule 400 mg, 400 mg, Oral, TID, Opyd, Timothy S, MD, 400 mg at 06/15/24 0846   HYDROmorphone  (DILAUDID ) injection 0.5-1 mg, 0.5-1 mg, Intravenous, Q4H PRN, Perri DELENA Meliton Mickey., MD, 1 mg at 06/13/24 1105   insulin  aspart (novoLOG ) injection 0-6 Units, 0-6 Units, Subcutaneous, Q4H, Opyd, Timothy S, MD, 1 Units at 06/14/24 2122   lip balm (CARMEX) ointment, , Topical, PRN, Opyd, Timothy S, MD   loratadine  (CLARITIN ) tablet 10 mg, 10 mg, Oral,  Daily, Opyd, Timothy S, MD, 10 mg at 06/15/24 0846   losartan  (COZAAR ) tablet 25 mg, 25 mg, Oral, Daily, Opyd, Timothy S, MD, 25 mg at 06/15/24 0846   methocarbamol  (ROBAXIN ) tablet 500 mg, 500 mg, Oral, Q6H PRN, 500 mg at 06/14/24 2122 **OR** methocarbamol  (ROBAXIN ) injection 500 mg, 500 mg, Intravenous, Q6H PRN, McClung, Sarah A, PA-C   oxyCODONE  (Oxy IR/ROXICODONE ) immediate release tablet 7.5 mg, 7.5 mg, Oral, Q4H PRN **OR** oxyCODONE  (Oxy IR/ROXICODONE ) immediate release tablet 15 mg, 15 mg, Oral, Q4H PRN, Perri DELENA Meliton Mickey., MD, 15 mg at 06/15/24 0405   polyethylene glycol (MIRALAX  / GLYCOLAX ) packet 17 g, 17 g, Oral, Daily PRN, Opyd, Timothy S, MD   polyethylene glycol (MIRALAX  / GLYCOLAX ) packet 17 g, 17 g, Oral, BID, Perri, A Meliton Mickey., MD   prochlorperazine  (COMPAZINE ) injection 5 mg, 5 mg, Intravenous, Q6H PRN, Opyd, Timothy S, MD, 5 mg at 06/14/24 1540   sertraline  (ZOLOFT ) tablet 100 mg, 100 mg, Oral, QHS, Opyd, Timothy S, MD, 100 mg at 06/14/24 2122   sodium chloride  flush (NS) 0.9 % injection 3 mL, 3 mL, Intravenous, Q12H, Opyd, Timothy S, MD, 3 mL at 06/15/24 0848   Vitamin D  (Ergocalciferol ) (DRISDOL ) 1.25 MG (50000 UNIT) capsule 50,000 Units, 50,000 Units, Oral, Q7 days, Perri DELENA Meliton Mickey., MD     Patients Current Diet:  Diet Order                  Diet Carb Modified Fluid consistency: Thin; Room service appropriate? Yes  Diet effective now                         Precautions / Restrictions Precautions Precautions: Fall Restrictions Weight Bearing Restrictions Per Provider Order: Yes RLE Weight Bearing Per Provider Order: Non weight bearing Other Position/Activity Restrictions: NWB RLE    Has the patient had 2 or more falls or a fall with injury in the past year? Yes   Prior Activity Level   Prior Functional Level Self Care: Did the patient need help bathing, dressing, using the toilet or eating? Independent   Indoor Mobility: Did the patient need  assistance with walking from room to room (with or without device)? Independent   Stairs: Did the patient need assistance with internal or external stairs (with or without device)? Independent   Functional Cognition: Did the patient need help planning regular tasks such as shopping or remembering to take medications? Independent   Patient Information Are you of Hispanic, Latino/a,or Spanish origin?: A. No, not of Hispanic, Latino/a,  or Spanish origin What is your race?: A. White Do you need or want an interpreter to communicate with a doctor or health care staff?: 0. No   Patient's Response To:  Health Literacy and Transportation Is the patient able to respond to health literacy and transportation needs?: Yes Health Literacy - How often do you need to have someone help you when you read instructions, pamphlets, or other written material from your doctor or pharmacy?: Never In the past 12 months, has lack of transportation kept you from medical appointments or from getting medications?: No In the past 12 months, has lack of transportation kept you from meetings, work, or from getting things needed for daily living?: No   Home Assistive Devices / Equipment Home Equipment: Crutches, Agricultural consultant (2 wheels), IT sales professional, Information systems manager, BSC/3in1   Prior Device Use: Indicate devices/aids used by the patient prior to current illness, exacerbation or injury? None of the above   Current Functional Level Cognition   Orientation Level: Oriented X4    Extremity Assessment (includes Sensation/Coordination)   Upper Extremity Assessment: Defer to OT evaluation  Lower Extremity Assessment: RLE deficits/detail RLE Deficits / Details: able to actively flex and extend knee, though short ranges, and difficulty extending against gravity     ADLs   Overall ADL's : Needs assistance/impaired Eating/Feeding: Set up, Sitting Grooming: Sitting, Minimal assistance, Brushing hair Upper Body Bathing:  Moderate assistance Lower Body Bathing: Maximal assistance Upper Body Dressing : Minimal assistance Lower Body Dressing: Maximal assistance Toilet Transfer: Moderate assistance, +2 for physical assistance Toileting- Clothing Manipulation and Hygiene: Maximal assistance Functional mobility during ADLs: Moderate assistance, +2 for physical assistance, Rolling walker (2 wheels)     Mobility   Overal bed mobility: Needs Assistance Bed Mobility: Supine to Sit Supine to sit: Mod assist General bed mobility comments: Step-by-step cues for technique; pt assisting RLE to EOB with use of gait belt loop; good use of LLE for half-bridging     Transfers   Overall transfer level: Needs assistance Equipment used: Rolling walker (2 wheels) Transfers: Bed to chair/wheelchair/BSC Sit to Stand: Mod assist, +2 physical assistance Bed to/from chair/wheelchair/BSC transfer type:: Lateral/scoot transfer Stand pivot transfers: Mod assist, +2 physical assistance  Lateral/Scoot Transfers: Mod assist General transfer comment: Opted to work on lateral scoot transfer bed to recliner for another option fro transfers; step-by-step cues for technique; good use of LLE as a pivot point; will consider sliding board     Ambulation / Gait / Stairs / Wheelchair Mobility         Posture / Balance Balance Overall balance assessment: Needs assistance Sitting-balance support: Feet supported Sitting balance-Leahy Scale: Good Standing balance support: During functional activity, Reliant on assistive device for balance Standing balance-Leahy Scale: Poor     Special needs/care consideration Skin operative wounds and Diabetic management      Previous Home Environment (from acute therapy documentation) Living Arrangements: Spouse/significant other, Children Available Help at Discharge: Family, Available 24 hours/day Type of Home: House Home Layout: One level Home Access: Stairs to enter Entrance Stairs-Rails:  Right Entrance Stairs-Number of Steps: 2 Bathroom Shower/Tub: Associate Professor: Yes Home Care Services: No   Discharge Living Setting Plans for Discharge Living Setting: Patient's home, Lives with (comment) (Spouse) Discharge Home Layout: One level Discharge Home Access: Stairs to enter Entrance Stairs-Rails: Right Entrance Stairs-Number of Steps: 2 Discharge Bathroom Shower/Tub: Tub/shower unit Discharge Bathroom Toilet: Standard Does the patient have any problems obtaining your medications?: No  Social/Family/Support Systems Anticipated Caregiver: Spouse, Richard Anticipated Industrial/product designer Information: [phone] Ability/Limitations of Caregiver: can provide min A Caregiver Availability: 24/7 Discharge Plan Discussed with Primary Caregiver: Yes Is Caregiver In Agreement with Plan?: Yes Does Caregiver/Family have Issues with Lodging/Transportation while Pt is in Rehab?: No   Goals Patient/Family Goal for Rehab: Mod I PT, OT Expected length of stay: 7-10 days Pt/Family Agrees to Admission and willing to participate: Yes Program Orientation Provided & Reviewed with Pt/Caregiver Including Roles  & Responsibilities: Yes  Barriers to Discharge: Insurance for SNF coverage   Decrease burden of Care through IP rehab admission: Othern/a   Possible need for SNF placement upon discharge: not anticipated   Patient Condition: I have reviewed medical records from [facility], spoken with TOC, and patient and spouse. I met with patient at the bedside for inpatient rehabilitation assessment.  Patient will benefit from ongoing PT and OT, can actively participate in 3 hours of therapy a day 5 days of the week, and can make measurable gains during the admission.  Patient will also benefit from the coordinated team approach during an Inpatient Acute Rehabilitation admission.  The patient will receive intensive therapy as well as Rehabilitation  physician, nursing, social worker, and care management interventions.  Due to safety, skin/wound care, disease management, medication administration, pain management, and patient education the patient requires 24 hour a day rehabilitation nursing.  The patient is currently Mod A with mobility and basic ADLs.  Discharge setting and therapy post discharge at home with home health is anticipated.  Patient has agreed to participate in the Acute Inpatient Rehabilitation Program and will admit today.   Preadmission Screen Completed By:  COSMO VEAR PLATTER, 06/15/2024 11:18 AM ______________________________________________________________________   Discussed status with Dr. Babs on 06/15/24 at 9:00am and received approval for admission today.   Admission Coordinator:  COSMO VEAR PLATTER, time 11:25 am /Date 06/15/24    Assessment/Plan: Diagnosis: right tib plateau fx Does the need for close, 24 hr/day Medical supervision in concert with the patient's rehab needs make it unreasonable for this patient to be served in a less intensive setting? Yes Co-Morbidities requiring supervision/potential complications: anxiety, depression, diabetes, lupus Due to bladder management, bowel management, safety, skin/wound care, disease management, medication administration, pain management, and patient education, does the patient require 24 hr/day rehab nursing? Yes Does the patient require coordinated care of a physician, rehab nurse, PT, OT to address physical and functional deficits in the context of the above medical diagnosis(es)? Yes Addressing deficits in the following areas: balance, endurance, locomotion, strength, transferring, bowel/bladder control, bathing, dressing, feeding, grooming, toileting, and psychosocial support Can the patient actively participate in an intensive therapy program of at least 3 hrs of therapy 5 days a week? Yes The potential for patient to make measurable gains while on inpatient rehab is  excellent Anticipated functional outcomes upon discharge from inpatient rehab: modified independent PT, modified independent OT, n/a SLP Estimated rehab length of stay to reach the above functional goals is: 7-10 days Anticipated discharge destination: Home 10. Overall Rehab/Functional Prognosis: excellent     MD Signature: Arthea IVAR Babs, MD, [facility] Physical Medicine & Rehabilitation Medical Director Rehabilitation Services 06/15/2024

## 2024-06-15 NOTE — Progress Notes (Addendum)
 Inpatient Rehab Coordinator Note:  Received approval from attending physician to admit to CIR today. We have bed available today and will plan to admit.   Rehab Admissons Coordinator Talissa Apple, [place], [place] [phone]

## 2024-06-15 NOTE — Progress Notes (Signed)
 Occupational Therapy Treatment Patient Details Name: Shirley Sullivan MRN: [id number] DOB: 10-27-1961 Today's Date: 06/15/2024   History of present illness Pt is a 63 y/o F presenting to ED on 7/10 after fall with R knee pain, found to have R medial tibial plateau fx, s/p ORIF on 7/11. PMH includes DM2, HTN, nephrolithaisis, lupus, depression, anxiety.   OT comments  Pt making excellent progress towards OT goals and eager to participate. Session focused on wheelchair transfers/mgmt for mobility during ADLs. Pt able to manage transfers with CGA-Min A and manage various UB ADLs seated at sink with Modified Independence. Min-Mod A needed for wheelchair mobility. Pt hopeful for a quick recovery back to independence with intensive rehab services.      If plan is discharge home, recommend the following:  A little help with walking and/or transfers;A lot of help with bathing/dressing/bathroom;Assistance with Charity fundraiser (measurements OT);Wheelchair cushion (measurements OT);Other (comment) (drop arm BSC)    Recommendations for Other Services Rehab consult    Precautions / Restrictions Precautions Precautions: Fall Restrictions Weight Bearing Restrictions Per Provider Order: Yes RLE Weight Bearing Per Provider Order: Non weight bearing       Mobility Bed Mobility               General bed mobility comments: Pt sitting EOB with spouse on entry, use of gait belt as leg lfiter    Transfers Overall transfer level: Needs assistance Equipment used: None Transfers: Bed to chair/wheelchair/BSC   Stand pivot transfers: Contact guard assist Squat pivot transfers: Min assist       General transfer comment: Min A to squat/scoot to wheelchair with cues for brake mgmt, removal of armrests and hand placement. CGA for standing using bedrail to transfer back tobed     Balance Overall balance assessment: Needs assistance Sitting-balance support:  Feet supported Sitting balance-Leahy Scale: Good                                     ADL either performed or assessed with clinical judgement   ADL Overall ADL's : Needs assistance/impaired     Grooming: Modified independent;Sitting;Oral care Grooming Details (indicate cue type and reason): Assisted with shampoo cap seated in w/c at sink. pt able to dry her hair and brush teeth while seated at sink as well                               General ADL Comments: Initiation of w/c transfers and w/c mobility during ADLs    Extremity/Trunk Assessment Upper Extremity Assessment Upper Extremity Assessment: Overall WFL for tasks assessed;Right hand dominant   Lower Extremity Assessment Lower Extremity Assessment: Defer to PT evaluation        Vision   Vision Assessment?: No apparent visual deficits   Perception     Praxis     Communication Communication Communication: No apparent difficulties   Cognition Arousal: Alert Behavior During Therapy: WFL for tasks assessed/performed, Anxious Cognition: No apparent impairments                               Following commands: Intact        Cueing   Cueing Techniques: Verbal cues  Exercises      Shoulder Instructions  General Comments Husband present and supportive    Pertinent Vitals/ Pain       Pain Assessment Pain Assessment: No/denies pain  Home Living   Living Arrangements: Spouse/significant other;Children Available Help at Discharge: Family;Available 24 hours/day Type of Home: House Home Access: Stairs to enter [employer] of Steps: 2 Entrance Stairs-Rails: Right Home Layout: One level     Bathroom Shower/Tub: Chief Strategy Officer: Standard Bathroom Accessibility: Yes              Prior Functioning/Environment              Frequency  Min 2X/week        Progress Toward Goals  OT Goals(current goals can now be found  in the care plan section)  Progress towards OT goals: Progressing toward goals  Acute Rehab OT Goals Patient Stated Goal: regain independence OT Goal Formulation: With patient Time For Goal Achievement: 06/27/24 Potential to Achieve Goals: Good ADL Goals Pt Will Perform Upper Body Dressing: with contact guard assist;sitting Pt Will Perform Lower Body Dressing: with mod assist;sitting/lateral leans;with adaptive equipment;sit to/from stand Pt Will Transfer to Toilet: with min assist;stand pivot transfer;bedside commode Additional ADL Goal #2: pt will perform bed mobility min A in prep for ADLs  Plan      Co-evaluation                 AM-PAC OT 6 Clicks Daily Activity     Outcome Measure   Help from another person eating meals?: A Little Help from another person taking care of personal grooming?: A Little Help from another person toileting, which includes using toliet, bedpan, or urinal?: A Lot Help from another person bathing (including washing, rinsing, drying)?: A Lot Help from another person to put on and taking off regular upper body clothing?: A Little Help from another person to put on and taking off regular lower body clothing?: A Lot 6 Click Score: 15    End of Session Equipment Utilized During Treatment: Other (comment) (wheelchair)  OT Visit Diagnosis: Unsteadiness on feet (R26.81);Other abnormalities of gait and mobility (R26.89);Muscle weakness (generalized) (M62.81)   Activity Tolerance Patient tolerated treatment well   Patient Left in bed;with call bell/phone within reach;with family/visitor present   Nurse Communication          Time: 8875-8848 OT Time Calculation (min): 27 min  Charges: OT General Charges $OT Visit: 1 Visit OT Treatments $Self Care/Home Management : 23-37 mins  Mliss NOVAK, OTR/L Acute Rehab Services Office: [phone]   Mliss Fish 06/15/2024, 12:38 PM

## 2024-06-15 NOTE — H&P (Signed)
 Physical Medicine and Rehabilitation Admission H&P        Chief Complaint  Patient presents with   Fall  : HPI: Shirley Sullivan. Shirley Sullivan is a 63 year old right handed female with history of anxiety, asthma, depression, diabetes mellitus without complications, lupus and hypertension.  Per chart review patient lives with spouse and son.  1 level home 2 steps to entry.  Presented 06/12/2024 after mechanical fall landing on her right knee.  She had immediate pain.  X-rays showed a right.  Tibial plateau fracture.  Underwent ORIF of right proximal tibial fracture 06/12/2024 per Dr. Kendal.  Nonweightbearing right lower extremity.  She was cleared to begin Lovenox  for DVT prophylaxis transitioning to aspirin  325 mg daily x 4 weeks on discharge.  Acute blood loss anemia 10.5.  Therapy evaluations completed due to patient's decreased functional mobility was admitted for a comprehensive rehab program.   Review of Systems  Constitutional:  Negative for chills and fever.  HENT:  Positive for sore throat. Negative for hearing loss.   Eyes:  Negative for blurred vision and double vision.  Respiratory:  Negative for wheezing.        Shortness of breath with heavy exertion  Cardiovascular:  Negative for chest pain and leg swelling.  Gastrointestinal:  Positive for constipation. Negative for heartburn, nausea and vomiting.  Genitourinary:  Negative for dysuria, flank pain and hematuria.  Musculoskeletal:  Positive for falls, joint pain and myalgias.  Skin:  Negative for rash.  Psychiatric/Behavioral:         Anxiety  All other systems reviewed and are negative.      Past Medical History:  Diagnosis Date   Allergy     Anxiety     Arthritis     Asthma      perfumes triggers attacks. Has inhalers for rescue   COVID-19 10/09/2020    and 08/15/20   Depression     Diabetes mellitus without complication (HCC)     History of kidney stones 1998, 2015   Lupus     Septic shock (HCC)      Klebsiella oxytocin and  Enterobacter cloacae bacteremia/UTI             Past Surgical History:  Procedure Laterality Date   ABDOMINAL HYSTERECTOMY   2004   APPENDECTOMY   1992   CYSTOSCOPY WITH RETROGRADE PYELOGRAM, URETEROSCOPY AND STENT PLACEMENT Left 08/11/2014    Procedure: CYSTOSCOPY WITH RETROGRADE PYELOGRAM, URETEROSCOPY AND STENT PLACEMENT,  DIGITAL FLEXIBLE URETEROSCOPE;  Surgeon: Gretel Ferrara, MD;  Location: [facility];  Service: Urology;  Laterality: Left;  request digital flexible ureteroscope   CYSTOSCOPY WITH RETROGRADE PYELOGRAM, URETEROSCOPY AND STENT PLACEMENT Left 08/23/2014    Procedure: CYSTOSCOPY WITH RETROGRADE PYELOGRAM, URETEROSCOPY AND STENT EXCHANGE;  Surgeon: Gretel Ferrara, MD;  Location: [facility];  Service: Urology;  Laterality: Left;   CYSTOSCOPY/URETEROSCOPY/HOLMIUM LASER/STENT PLACEMENT Right 11/02/2020    Procedure: CYSTOSCOPY/URETEROSCOPY/HOLMIUM LASER/STENT PLACEMENT/ REMOVAL OF RIGHT NEPHROSTOMY TUBE;  Surgeon: Ferrara Gretel, MD;  Location: [facility];  Service: Urology;  Laterality: Right;  patient will not need to be covid tested, tested positive on 11/14/20 has proof   HOLMIUM LASER APPLICATION Left 08/23/2014    Procedure: HOLMIUM LASER APPLICATION;  Surgeon: Gretel Ferrara, MD;  Location: [facility];  Service: Urology;  Laterality: Left;   IR NEPHROSTOMY EXCHANGE RIGHT   10/17/2020   IR NEPHROSTOMY PLACEMENT RIGHT   10/10/2020   ROTATOR CUFF REPAIR Right 2007  Family History  Problem Relation Age of Onset   Hyperlipidemia Mother          Social History:  reports that she has never smoked. She has never used smokeless tobacco. She reports that she does not drink alcohol and does not use drugs. Allergies:  Allergies       Allergies  Allergen Reactions   Codeine Itching   Strawberry Extract Hives, Itching and Other (See Comments)      Can't breathe   Vibramycin [Doxycycline Calcium] Nausea And Vomiting            Medications Prior to Admission  Medication Sig Dispense  Refill   alendronate (FOSAMAX) 70 MG tablet Take 70 mg by mouth once a week.       ALPRAZolam  (XANAX ) 0.5 MG tablet Take 0.5 tablets (0.25 mg total) by mouth 2 (two) times daily as needed for anxiety.   0   cetirizine (ZYRTEC) 10 MG tablet Take 10 mg by mouth at bedtime.        dicyclomine (BENTYL) 10 MG capsule Take 10 mg by mouth 4 (four) times daily as needed (abdominal spasm.).        losartan  (COZAAR ) 25 MG tablet Take 25 mg by mouth daily.       metFORMIN (GLUCOPHAGE) 1000 MG tablet Take 1,000 mg by mouth 2 (two) times daily with a meal.       mupirocin ointment (BACTROBAN) 2 % Apply 1 application topically 2 (two) times daily as needed (wound care (hidradenitis suppurativa)).        ondansetron  (ZOFRAN  ODT) 4 MG disintegrating tablet 4mg  ODT q4 hours prn nausea/vomit (Patient taking differently: Take 4 mg by mouth every 4 (four) hours as needed for nausea or vomiting.) 30 tablet 0   sertraline  (ZOLOFT ) 100 MG tablet Take 100 mg by mouth at bedtime.       rosuvastatin (CRESTOR) 20 MG tablet Take 20 mg by mouth at bedtime. (Patient not taking: Reported on 02/07/2024)       [DISCONTINUED] gabapentin  (NEURONTIN ) 400 MG capsule Gabapentin  400 mg three times per day, can take an extra 400 mg at bedtime if needed. 270 capsule 1              Home: Home Living Family/patient expects to be discharged to:: Private residence Living Arrangements: Spouse/significant other, Children (son) Available Help at Discharge: Family, Available 24 hours/day Type of Home: House Home Access: Stairs to enter Secretary/administrator of Steps: 2 Entrance Stairs-Rails: Right Home Layout: One level Bathroom Shower/Tub: Engineer, manufacturing systems: Standard Home Equipment: Crutches, Agricultural consultant (2 wheels), IT sales professional, Information systems manager, BSC/3in1   Functional History: Prior Function Prior Level of Function : Needs assist Mobility Comments: no AD use ADLs Comments: ind   Functional Status:   Mobility: Bed Mobility Overal bed mobility: Needs Assistance Bed Mobility: Supine to Sit Supine to sit: Mod assist General bed mobility comments: Step-by-step cues for technique; pt assisting RLE to EOB with use of gait belt loop; good use of LLE for half-bridging Transfers Overall transfer level: Needs assistance Equipment used: Rolling walker (2 wheels) Transfers: Bed to chair/wheelchair/BSC Sit to Stand: Mod assist, +2 physical assistance Bed to/from chair/wheelchair/BSC transfer type:: Lateral/scoot transfer Stand pivot transfers: Mod assist, +2 physical assistance  Lateral/Scoot Transfers: Mod assist General transfer comment: Opted to work on lateral scoot transfer bed to recliner for another option fro transfers; step-by-step cues for technique; good use of LLE as a pivot point; will consider sliding board  ADL: ADL Overall ADL's : Needs assistance/impaired Eating/Feeding: Set up, Sitting Grooming: Sitting, Minimal assistance, Brushing hair Upper Body Bathing: Moderate assistance Lower Body Bathing: Maximal assistance Upper Body Dressing : Minimal assistance Lower Body Dressing: Maximal assistance Toilet Transfer: Moderate assistance, +2 for physical assistance Toileting- Clothing Manipulation and Hygiene: Maximal assistance Functional mobility during ADLs: Moderate assistance, +2 for physical assistance, Rolling walker (2 wheels)   Cognition: Cognition Orientation Level: Oriented X4 Cognition Arousal: Alert Behavior During Therapy: WFL for tasks assessed/performed, Anxious   Physical Exam: Blood pressure (!) 156/75, pulse 96, temperature 98.4 F (36.9 C), temperature source Oral, resp. rate 16, height 5' 4 (1.626 m), weight 83.9 kg, SpO2 100%. Physical Exam Constitutional:      General: She is not in acute distress.    Appearance: She is obese. She is not ill-appearing.  HENT:     Head: Normocephalic and atraumatic.     Right Ear: External ear normal.     Left  Ear: External ear normal.     Nose: Nose normal.     Mouth/Throat:     Mouth: Mucous membranes are moist.     Pharynx: Oropharynx is clear.  Eyes:     General:        Right eye: No discharge.        Left eye: No discharge.     Conjunctiva/sclera: Conjunctivae normal.  Neck:     Comments: Tenderness along lateral neck, palpable tender nodes R>L Cardiovascular:     Rate and Rhythm: Normal rate and regular rhythm.     Heart sounds: No murmur heard.    No gallop.  Pulmonary:     Effort: Pulmonary effort is normal. No respiratory distress.     Breath sounds: No wheezing.  Abdominal:     General: Bowel sounds are normal. There is no distension.     Tenderness: There is no abdominal tenderness.  Musculoskeletal:     Cervical back: Normal range of motion.     Comments: Right knee with rom from 0-70 degrees. Right knee still swollen, bruised and tender.   Skin:    Comments: Right lower extremity wound CDI, associated bruising  Neurological:     Mental Status: She is alert.     Comments: Alert and oriented x 3. Normal insight and awareness. Intact Memory. Normal language and speech. Cranial nerve exam unremarkable. MMT: BUE 5/5. RLE 2/5 HF, KE d/t pain. 5/5 RADF/PF.  LLE 4 to 5/5 prox to distal. Sensory exam normal for light touch and pain in all 4 limbs. No limb ataxia or cerebellar signs. No abnormal tone appreciated.  .         Lab Results Last 48 Hours        Results for orders placed or performed during the hospital encounter of 06/11/24 (from the past 48 hours)  Glucose, capillary     Status: Abnormal    Collection Time: 06/13/24 12:12 PM  Result Value Ref Range    Glucose-Capillary 175 (H) 70 - 99 mg/dL      Comment: Glucose reference range applies only to samples taken after fasting for at least 8 hours.  Glucose, capillary     Status: Abnormal    Collection Time: 06/13/24  5:01 PM  Result Value Ref Range    Glucose-Capillary 146 (H) 70 - 99 mg/dL      Comment: Glucose  reference range applies only to samples taken after fasting for at least 8 hours.    Comment 1 Notify  RN    Glucose, capillary     Status: Abnormal    Collection Time: 06/13/24 10:41 PM  Result Value Ref Range    Glucose-Capillary 150 (H) 70 - 99 mg/dL      Comment: Glucose reference range applies only to samples taken after fasting for at least 8 hours.  Glucose, capillary     Status: Abnormal    Collection Time: 06/14/24 12:34 AM  Result Value Ref Range    Glucose-Capillary 130 (H) 70 - 99 mg/dL      Comment: Glucose reference range applies only to samples taken after fasting for at least 8 hours.  Glucose, capillary     Status: Abnormal    Collection Time: 06/14/24  5:16 AM  Result Value Ref Range    Glucose-Capillary 138 (H) 70 - 99 mg/dL      Comment: Glucose reference range applies only to samples taken after fasting for at least 8 hours.  Glucose, capillary     Status: Abnormal    Collection Time: 06/14/24  8:51 AM  Result Value Ref Range    Glucose-Capillary 134 (H) 70 - 99 mg/dL      Comment: Glucose reference range applies only to samples taken after fasting for at least 8 hours.  Basic metabolic panel     Status: Abnormal    Collection Time: 06/14/24  8:59 AM  Result Value Ref Range    Sodium 137 135 - 145 mmol/L    Potassium 3.6 3.5 - 5.1 mmol/L    Chloride 102 98 - 111 mmol/L    CO2 27 22 - 32 mmol/L    Glucose, Bld 149 (H) 70 - 99 mg/dL      Comment: Glucose reference range applies only to samples taken after fasting for at least 8 hours.    BUN 10 8 - 23 mg/dL    Creatinine, Ser 9.37 0.44 - 1.00 mg/dL    Calcium 8.9 8.9 - 89.6 mg/dL    GFR, Estimated >39 >39 mL/min      Comment: (NOTE) Calculated using the CKD-EPI Creatinine Equation (2021)      Anion gap 8 5 - 15      Comment: Performed at [facility], [address], [place], [place] [address]  CBC     Status: Abnormal    Collection Time: 06/14/24  8:59 AM  Result Value Ref Range    WBC 4.7 4.0 -  10.5 K/uL    RBC 3.72 (L) 3.87 - 5.11 MIL/uL    Hemoglobin 10.9 (L) 12.0 - 15.0 g/dL    HCT 67.0 (L) 63.9 - 46.0 %    MCV 88.4 80.0 - 100.0 fL    MCH 29.3 26.0 - 34.0 pg    MCHC 33.1 30.0 - 36.0 g/dL    RDW 85.3 88.4 - 84.4 %    Platelets 139 (L) 150 - 400 K/uL    nRBC 0.0 0.0 - 0.2 %      Comment: Performed at [facility], [address], [place], [place] [address]  Glucose, capillary     Status: Abnormal    Collection Time: 06/14/24 12:07 PM  Result Value Ref Range    Glucose-Capillary 121 (H) 70 - 99 mg/dL      Comment: Glucose reference range applies only to samples taken after fasting for at least 8 hours.  Glucose, capillary     Status: Abnormal    Collection Time: 06/14/24  4:14 PM  Result Value Ref  Range    Glucose-Capillary 148 (H) 70 - 99 mg/dL      Comment: Glucose reference range applies only to samples taken after fasting for at least 8 hours.  Glucose, capillary     Status: Abnormal    Collection Time: 06/14/24  7:28 PM  Result Value Ref Range    Glucose-Capillary 168 (H) 70 - 99 mg/dL      Comment: Glucose reference range applies only to samples taken after fasting for at least 8 hours.  Glucose, capillary     Status: Abnormal    Collection Time: 06/15/24 12:04 AM  Result Value Ref Range    Glucose-Capillary 140 (H) 70 - 99 mg/dL      Comment: Glucose reference range applies only to samples taken after fasting for at least 8 hours.  Glucose, capillary     Status: Abnormal    Collection Time: 06/15/24  4:04 AM  Result Value Ref Range    Glucose-Capillary 132 (H) 70 - 99 mg/dL      Comment: Glucose reference range applies only to samples taken after fasting for at least 8 hours.  Basic metabolic panel     Status: Abnormal    Collection Time: 06/15/24  7:01 AM  Result Value Ref Range    Sodium 138 135 - 145 mmol/L    Potassium 3.5 3.5 - 5.1 mmol/L    Chloride 102 98 - 111 mmol/L    CO2 28 22 - 32 mmol/L    Glucose, Bld 142 (H) 70 - 99 mg/dL      Comment:  Glucose reference range applies only to samples taken after fasting for at least 8 hours.    BUN 11 8 - 23 mg/dL    Creatinine, Ser 9.39 0.44 - 1.00 mg/dL    Calcium 8.7 (L) 8.9 - 10.3 mg/dL    GFR, Estimated >39 >39 mL/min      Comment: (NOTE) Calculated using the CKD-EPI Creatinine Equation (2021)      Anion gap 8 5 - 15      Comment: Performed at [facility], [address] [place], [place] [address]  CBC     Status: Abnormal    Collection Time: 06/15/24  7:01 AM  Result Value Ref Range    WBC 3.9 (L) 4.0 - 10.5 K/uL    RBC 3.56 (L) 3.87 - 5.11 MIL/uL    Hemoglobin 10.5 (L) 12.0 - 15.0 g/dL    HCT 68.5 (L) 63.9 - 46.0 %    MCV 88.2 80.0 - 100.0 fL    MCH 29.5 26.0 - 34.0 pg    MCHC 33.4 30.0 - 36.0 g/dL    RDW 85.4 88.4 - 84.4 %    Platelets 129 (L) 150 - 400 K/uL    nRBC 0.0 0.0 - 0.2 %      Comment: Performed at [facility], [address], [place], [place] [address]  Glucose, capillary     Status: Abnormal    Collection Time: 06/15/24  7:55 AM  Result Value Ref Range    Glucose-Capillary 125 (H) 70 - 99 mg/dL      Comment: Glucose reference range applies only to samples taken after fasting for at least 8 hours.    Comment 1 Notify RN        Imaging Results (Last 48 hours)  No results found.         Blood pressure (!) 156/75, pulse 96, temperature 98.4 F (36.9 C), temperature  source Oral, resp. rate 16, height 5' 4 (1.626 m), weight 83.9 kg, SpO2 100%.   Medical Problem List and Plan: 1. Functional deficits secondary to right proximal tibial fracture.  Status post ORIF 06/12/2024 per Dr. Kendal.               -TDWB RLE per Dr. Kendal op-note             -ACE wrap for support of Right knee             -patient may  shower             -ELOS/Goals: 7-10 days, mod I goals 2.  Antithrombotics: -DVT/anticoagulation:  Pharmaceutical: Lovenox  check vascular study.  Transition to aspirin  325 mg daily x 4 weeks outpatient on discharge              -antiplatelet therapy: N/A 3. Pain Management: Neurontin  400 mg 3 times daily, Robaxin  and oxycodone  as needed             -pain fairly well controlled 4. Mood/Behavior/Sleep: Zoloft  100 mg daily             -antipsychotic agents:  5. Neuropsych/cognition: This patient is capable of making decisions on her own behalf. 6. Skin/Wound Care: Routine skin checks 7. Fluids/Electrolytes/Nutrition: Routine in and outs with follow-up chemistries 8.  Acute blood loss anemia.  Follow-up CBC 9.  Hypertension.  Cozaar  25 mg daily.  Monitor with increased mobility 10.  Diabetes mellitus with peripheral neuropathy.  Currently on SSI.  Patient on Glucophage 1000 mg twice daily prior to admission.  Resume as needed      Toribio JINNY Pitch, PA-C 06/15/2024  I have personally performed a face to face diagnostic evaluation of this patient and formulated the key components of the plan.  Additionally, I have personally reviewed laboratory data, imaging studies, as well as relevant notes and concur with the physician assistant's documentation above.  The patient's status has not changed from the original H&P.  Any changes in documentation from the acute care chart have been noted above.  Arthea IVAR Gunther, MD, LEELLEN

## 2024-06-15 NOTE — TOC CAGE-AID Note (Signed)
 Transition of Care [facility]) - CAGE-AID Screening   Patient Details  Name: Shirley Sullivan MRN: [id number] Date of Birth: [birth year]  Transition of [facility] CM/SW Contact:    Nickolus Wadding E Jasilyn Holderman, LCSW Phone Number: 06/15/2024, 11:10 AM   Clinical Narrative: No SA noted.   CAGE-AID Screening:    Have You Ever Felt You Ought to Cut Down on Your Drinking or Drug Use?: No Have People Annoyed You By Critizing Your Drinking Or Drug Use?: No Have You Felt Bad Or Guilty About Your Drinking Or Drug Use?: No Have You Ever Had a Drink or Used Drugs First Thing In The Morning to Steady Your Nerves or to Get Rid of a Hangover?: No CAGE-AID Score: 0  Substance Abuse Education Offered: No

## 2024-06-15 NOTE — Progress Notes (Signed)
 Patient ID: Shirley Sullivan, female   DOB: 15-Jul-1961, 63 y.o.   MRN: [id number] Met with the patient and spouse to review current medical situation post ORIF right Tibial plateau fracture; NWB  and DVT prophylaxis daily x 4 weeks post. Reviewed secondary risk management including DM (A1C 5.8), HTN and bone health with Vitamin D  deficiency supplement on board. Also notes was taking Vit B 1 for Lupus PTA. Reviewed medications ordered and CMM diet.  Continue to follow along to address educational needs to facilitate preparation for discharge. Fredericka Barnie NOVAK

## 2024-06-15 NOTE — Progress Notes (Signed)
 Pt is complaining of nausea, on call Fidela Ned called @ [phone], new orders received, Zofran  added prn.

## 2024-06-15 NOTE — Discharge Instructions (Addendum)
 Inpatient Rehab Discharge Instructions  KINGA CASSAR Discharge date and time: No discharge date for patient encounter.   Activities/Precautions/ Functional Status: Activity: Nonweightbearing right lower extremity Diet: Diabetic diet Wound Care: Routine skin checks Functional status:  ___ No restrictions     ___ Walk up steps independently ___ 24/7 supervision/assistance   ___ Walk up steps with assistance ___ Intermittent supervision/assistance  ___ Bathe/dress independently ___ Walk with walker     __x_ Bathe/dress with assistance ___ Walk Independently    ___ Shower independently ___ Walk with assistance    ___ Shower with assistance ___ No alcohol     ___ Return to work/school ________  Special Instructions: No driving smoking or alcohol   My questions have been answered and I understand these instructions. I will adhere to these goals and the provided educational materials after my discharge from the hospital.  Patient/Caregiver Signature _______________________________ Date __________  Clinician Signature _______________________________________ Date __________  Please bring this form and your medication list with you to all your follow-up doctor's appointments.

## 2024-06-15 NOTE — Progress Notes (Signed)
 BLE venous duplex has been completed.   Results can be found under chart review under CV PROC. 06/15/2024 4:25 PM Sacred Roa RVT, RDMS

## 2024-06-15 NOTE — H&P (Signed)
 Physical Medicine and Rehabilitation Admission H&P    Chief Complaint  Patient presents with   Fall  : HPI: Shirley Sullivan. Turbin is a 63 year old right handed female with history of anxiety, asthma, depression, diabetes mellitus without complications, lupus and hypertension.  Per chart review patient lives with spouse and son.  1 level home 2 steps to entry.  Presented 06/12/2024 after mechanical fall landing on her right knee.  She had immediate pain.  X-rays showed a right.  Tibial plateau fracture.  Underwent ORIF of right proximal tibial fracture 06/12/2024 per Dr. Kendal.  Nonweightbearing right lower extremity.  She was cleared to begin Lovenox  for DVT prophylaxis transitioning to aspirin  325 mg daily x 4 weeks on discharge.  Acute blood loss anemia 10.5.  Therapy evaluations completed due to patient's decreased functional mobility was admitted for a comprehensive rehab program.  Review of Systems  Constitutional:  Negative for chills and fever.  HENT:  Positive for sore throat. Negative for hearing loss.   Eyes:  Negative for blurred vision and double vision.  Respiratory:  Negative for wheezing.        Shortness of breath with heavy exertion  Cardiovascular:  Negative for chest pain and leg swelling.  Gastrointestinal:  Positive for constipation. Negative for heartburn, nausea and vomiting.  Genitourinary:  Negative for dysuria, flank pain and hematuria.  Musculoskeletal:  Positive for falls, joint pain and myalgias.  Skin:  Negative for rash.  Psychiatric/Behavioral:         Anxiety  All other systems reviewed and are negative.  Past Medical History:  Diagnosis Date   Allergy    Anxiety    Arthritis    Asthma    perfumes triggers attacks. Has inhalers for rescue   COVID-19 10/09/2020   and 08/15/20   Depression    Diabetes mellitus without complication (HCC)    History of kidney stones 1998, 2015   Lupus    Septic shock (HCC)    Klebsiella oxytocin and Enterobacter cloacae  bacteremia/UTI   Past Surgical History:  Procedure Laterality Date   ABDOMINAL HYSTERECTOMY  2004   APPENDECTOMY  1992   CYSTOSCOPY WITH RETROGRADE PYELOGRAM, URETEROSCOPY AND STENT PLACEMENT Left 08/11/2014   Procedure: CYSTOSCOPY WITH RETROGRADE PYELOGRAM, URETEROSCOPY AND STENT PLACEMENT,  DIGITAL FLEXIBLE URETEROSCOPE;  Surgeon: Gretel Ferrara, MD;  Location: [facility];  Service: Urology;  Laterality: Left;  request digital flexible ureteroscope   CYSTOSCOPY WITH RETROGRADE PYELOGRAM, URETEROSCOPY AND STENT PLACEMENT Left 08/23/2014   Procedure: CYSTOSCOPY WITH RETROGRADE PYELOGRAM, URETEROSCOPY AND STENT EXCHANGE;  Surgeon: Gretel Ferrara, MD;  Location: [facility];  Service: Urology;  Laterality: Left;   CYSTOSCOPY/URETEROSCOPY/HOLMIUM LASER/STENT PLACEMENT Right 11/02/2020   Procedure: CYSTOSCOPY/URETEROSCOPY/HOLMIUM LASER/STENT PLACEMENT/ REMOVAL OF RIGHT NEPHROSTOMY TUBE;  Surgeon: Ferrara Gretel, MD;  Location: [facility];  Service: Urology;  Laterality: Right;  patient will not need to be covid tested, tested positive on 11/14/20 has proof   HOLMIUM LASER APPLICATION Left 08/23/2014   Procedure: HOLMIUM LASER APPLICATION;  Surgeon: Gretel Ferrara, MD;  Location: [facility];  Service: Urology;  Laterality: Left;   IR NEPHROSTOMY EXCHANGE RIGHT  10/17/2020   IR NEPHROSTOMY PLACEMENT RIGHT  10/10/2020   ROTATOR CUFF REPAIR Right 2007   Family History  Problem Relation Age of Onset   Hyperlipidemia Mother    Social History:  reports that she has never smoked. She has never used smokeless tobacco. She reports that she does not drink alcohol and does not use drugs. Allergies:  Allergies  Allergen Reactions   Codeine Itching   Strawberry Extract Hives, Itching and Other (See Comments)    Can't breathe   Vibramycin [Doxycycline Calcium] Nausea And Vomiting   Medications Prior to Admission  Medication Sig Dispense Refill   alendronate (FOSAMAX) 70 MG tablet Take 70 mg by mouth once a week.     ALPRAZolam   (XANAX ) 0.5 MG tablet Take 0.5 tablets (0.25 mg total) by mouth 2 (two) times daily as needed for anxiety.  0   cetirizine (ZYRTEC) 10 MG tablet Take 10 mg by mouth at bedtime.      dicyclomine (BENTYL) 10 MG capsule Take 10 mg by mouth 4 (four) times daily as needed (abdominal spasm.).      losartan  (COZAAR ) 25 MG tablet Take 25 mg by mouth daily.     metFORMIN (GLUCOPHAGE) 1000 MG tablet Take 1,000 mg by mouth 2 (two) times daily with a meal.     mupirocin ointment (BACTROBAN) 2 % Apply 1 application topically 2 (two) times daily as needed (wound care (hidradenitis suppurativa)).      ondansetron  (ZOFRAN  ODT) 4 MG disintegrating tablet 4mg  ODT q4 hours prn nausea/vomit (Patient taking differently: Take 4 mg by mouth every 4 (four) hours as needed for nausea or vomiting.) 30 tablet 0   sertraline  (ZOLOFT ) 100 MG tablet Take 100 mg by mouth at bedtime.     rosuvastatin (CRESTOR) 20 MG tablet Take 20 mg by mouth at bedtime. (Patient not taking: Reported on 02/07/2024)     [DISCONTINUED] gabapentin  (NEURONTIN ) 400 MG capsule Gabapentin  400 mg three times per day, can take an extra 400 mg at bedtime if needed. 270 capsule 1      Home: Home Living Family/patient expects to be discharged to:: Private residence Living Arrangements: Spouse/significant other, Children (son) Available Help at Discharge: Family, Available 24 hours/day Type of Home: House Home Access: Stairs to enter Secretary/administrator of Steps: 2 Entrance Stairs-Rails: Right Home Layout: One level Bathroom Shower/Tub: Engineer, manufacturing systems: Standard Home Equipment: Crutches, Agricultural consultant (2 wheels), IT sales professional, Information systems manager, BSC/3in1   Functional History: Prior Function Prior Level of Function : Needs assist Mobility Comments: no AD use ADLs Comments: ind  Functional Status:  Mobility: Bed Mobility Overal bed mobility: Needs Assistance Bed Mobility: Supine to Sit Supine to sit: Mod assist General bed  mobility comments: Step-by-step cues for technique; pt assisting RLE to EOB with use of gait belt loop; good use of LLE for half-bridging Transfers Overall transfer level: Needs assistance Equipment used: Rolling walker (2 wheels) Transfers: Bed to chair/wheelchair/BSC Sit to Stand: Mod assist, +2 physical assistance Bed to/from chair/wheelchair/BSC transfer type:: Lateral/scoot transfer Stand pivot transfers: Mod assist, +2 physical assistance  Lateral/Scoot Transfers: Mod assist General transfer comment: Opted to work on lateral scoot transfer bed to recliner for another option fro transfers; step-by-step cues for technique; good use of LLE as a pivot point; will consider sliding board      ADL: ADL Overall ADL's : Needs assistance/impaired Eating/Feeding: Set up, Sitting Grooming: Sitting, Minimal assistance, Brushing hair Upper Body Bathing: Moderate assistance Lower Body Bathing: Maximal assistance Upper Body Dressing : Minimal assistance Lower Body Dressing: Maximal assistance Toilet Transfer: Moderate assistance, +2 for physical assistance Toileting- Clothing Manipulation and Hygiene: Maximal assistance Functional mobility during ADLs: Moderate assistance, +2 for physical assistance, Rolling walker (2 wheels)  Cognition: Cognition Orientation Level: Oriented X4 Cognition Arousal: Alert Behavior During Therapy: WFL for tasks assessed/performed, Anxious  Physical Exam: Blood pressure (!) 156/75, pulse  96, temperature 98.4 F (36.9 C), temperature source Oral, resp. rate 16, height 5' 4 (1.626 m), weight 83.9 kg, SpO2 100%. Physical Exam Constitutional:      General: She is not in acute distress.    Appearance: She is obese. She is not ill-appearing.  HENT:     Head: Normocephalic and atraumatic.     Right Ear: External ear normal.     Left Ear: External ear normal.     Nose: Nose normal.     Mouth/Throat:     Mouth: Mucous membranes are moist.     Pharynx:  Oropharynx is clear.  Eyes:     General:        Right eye: No discharge.        Left eye: No discharge.     Conjunctiva/sclera: Conjunctivae normal.  Neck:     Comments: Tenderness along lateral neck, palpable tender nodes R>L Cardiovascular:     Rate and Rhythm: Normal rate and regular rhythm.     Heart sounds: No murmur heard.    No gallop.  Pulmonary:     Effort: Pulmonary effort is normal. No respiratory distress.     Breath sounds: No wheezing.  Abdominal:     General: Bowel sounds are normal. There is no distension.     Tenderness: There is no abdominal tenderness.  Musculoskeletal:     Cervical back: Normal range of motion.     Comments: Right knee with rom from 0-70 degrees. Right knee still swollen, bruised and tender.   Skin:    Comments: Right lower extremity wound CDI, associated bruising  Neurological:     Mental Status: She is alert.     Comments: Alert and oriented x 3. Normal insight and awareness. Intact Memory. Normal language and speech. Cranial nerve exam unremarkable. MMT: BUE 5/5. RLE 2/5 HF, KE d/t pain. 5/5 RADF/PF.  LLE 4 to 5/5 prox to distal. Sensory exam normal for light touch and pain in all 4 limbs. No limb ataxia or cerebellar signs. No abnormal tone appreciated.  SABRA       Results for orders placed or performed during the hospital encounter of 06/11/24 (from the past 48 hours)  Glucose, capillary     Status: Abnormal   Collection Time: 06/13/24 12:12 PM  Result Value Ref Range   Glucose-Capillary 175 (H) 70 - 99 mg/dL    Comment: Glucose reference range applies only to samples taken after fasting for at least 8 hours.  Glucose, capillary     Status: Abnormal   Collection Time: 06/13/24  5:01 PM  Result Value Ref Range   Glucose-Capillary 146 (H) 70 - 99 mg/dL    Comment: Glucose reference range applies only to samples taken after fasting for at least 8 hours.   Comment 1 Notify RN   Glucose, capillary     Status: Abnormal   Collection Time:  06/13/24 10:41 PM  Result Value Ref Range   Glucose-Capillary 150 (H) 70 - 99 mg/dL    Comment: Glucose reference range applies only to samples taken after fasting for at least 8 hours.  Glucose, capillary     Status: Abnormal   Collection Time: 06/14/24 12:34 AM  Result Value Ref Range   Glucose-Capillary 130 (H) 70 - 99 mg/dL    Comment: Glucose reference range applies only to samples taken after fasting for at least 8 hours.  Glucose, capillary     Status: Abnormal   Collection Time: 06/14/24  5:16 AM  Result Value Ref Range   Glucose-Capillary 138 (H) 70 - 99 mg/dL    Comment: Glucose reference range applies only to samples taken after fasting for at least 8 hours.  Glucose, capillary     Status: Abnormal   Collection Time: 06/14/24  8:51 AM  Result Value Ref Range   Glucose-Capillary 134 (H) 70 - 99 mg/dL    Comment: Glucose reference range applies only to samples taken after fasting for at least 8 hours.  Basic metabolic panel     Status: Abnormal   Collection Time: 06/14/24  8:59 AM  Result Value Ref Range   Sodium 137 135 - 145 mmol/L   Potassium 3.6 3.5 - 5.1 mmol/L   Chloride 102 98 - 111 mmol/L   CO2 27 22 - 32 mmol/L   Glucose, Bld 149 (H) 70 - 99 mg/dL    Comment: Glucose reference range applies only to samples taken after fasting for at least 8 hours.   BUN 10 8 - 23 mg/dL   Creatinine, Ser 9.37 0.44 - 1.00 mg/dL   Calcium 8.9 8.9 - 89.6 mg/dL   GFR, Estimated >39 >39 mL/min    Comment: (NOTE) Calculated using the CKD-EPI Creatinine Equation (2021)    Anion gap 8 5 - 15    Comment: Performed at [facility], [address] [place], [place] [address]  CBC     Status: Abnormal   Collection Time: 06/14/24  8:59 AM  Result Value Ref Range   WBC 4.7 4.0 - 10.5 K/uL   RBC 3.72 (L) 3.87 - 5.11 MIL/uL   Hemoglobin 10.9 (L) 12.0 - 15.0 g/dL   HCT 67.0 (L) 63.9 - 53.9 %   MCV 88.4 80.0 - 100.0 fL   MCH 29.3 26.0 - 34.0 pg   MCHC 33.1 30.0 - 36.0 g/dL   RDW  85.3 88.4 - 84.4 %   Platelets 139 (L) 150 - 400 K/uL   nRBC 0.0 0.0 - 0.2 %    Comment: Performed at [facility], [address] [place], [place] [address]  Glucose, capillary     Status: Abnormal   Collection Time: 06/14/24 12:07 PM  Result Value Ref Range   Glucose-Capillary 121 (H) 70 - 99 mg/dL    Comment: Glucose reference range applies only to samples taken after fasting for at least 8 hours.  Glucose, capillary     Status: Abnormal   Collection Time: 06/14/24  4:14 PM  Result Value Ref Range   Glucose-Capillary 148 (H) 70 - 99 mg/dL    Comment: Glucose reference range applies only to samples taken after fasting for at least 8 hours.  Glucose, capillary     Status: Abnormal   Collection Time: 06/14/24  7:28 PM  Result Value Ref Range   Glucose-Capillary 168 (H) 70 - 99 mg/dL    Comment: Glucose reference range applies only to samples taken after fasting for at least 8 hours.  Glucose, capillary     Status: Abnormal   Collection Time: 06/15/24 12:04 AM  Result Value Ref Range   Glucose-Capillary 140 (H) 70 - 99 mg/dL    Comment: Glucose reference range applies only to samples taken after fasting for at least 8 hours.  Glucose, capillary     Status: Abnormal   Collection Time: 06/15/24  4:04 AM  Result Value Ref Range   Glucose-Capillary 132 (H) 70 - 99 mg/dL    Comment: Glucose reference range applies only to samples taken  after fasting for at least 8 hours.  Basic metabolic panel     Status: Abnormal   Collection Time: 06/15/24  7:01 AM  Result Value Ref Range   Sodium 138 135 - 145 mmol/L   Potassium 3.5 3.5 - 5.1 mmol/L   Chloride 102 98 - 111 mmol/L   CO2 28 22 - 32 mmol/L   Glucose, Bld 142 (H) 70 - 99 mg/dL    Comment: Glucose reference range applies only to samples taken after fasting for at least 8 hours.   BUN 11 8 - 23 mg/dL   Creatinine, Ser 9.39 0.44 - 1.00 mg/dL   Calcium 8.7 (L) 8.9 - 10.3 mg/dL   GFR, Estimated >39 >39 mL/min    Comment:  (NOTE) Calculated using the CKD-EPI Creatinine Equation (2021)    Anion gap 8 5 - 15    Comment: Performed at [facility], [address], [place], [place] [address]  CBC     Status: Abnormal   Collection Time: 06/15/24  7:01 AM  Result Value Ref Range   WBC 3.9 (L) 4.0 - 10.5 K/uL   RBC 3.56 (L) 3.87 - 5.11 MIL/uL   Hemoglobin 10.5 (L) 12.0 - 15.0 g/dL   HCT 68.5 (L) 63.9 - 53.9 %   MCV 88.2 80.0 - 100.0 fL   MCH 29.5 26.0 - 34.0 pg   MCHC 33.4 30.0 - 36.0 g/dL   RDW 85.4 88.4 - 84.4 %   Platelets 129 (L) 150 - 400 K/uL   nRBC 0.0 0.0 - 0.2 %    Comment: Performed at [facility], [address] [place], [place] [address]  Glucose, capillary     Status: Abnormal   Collection Time: 06/15/24  7:55 AM  Result Value Ref Range   Glucose-Capillary 125 (H) 70 - 99 mg/dL    Comment: Glucose reference range applies only to samples taken after fasting for at least 8 hours.   Comment 1 Notify RN    No results found.    Blood pressure (!) 156/75, pulse 96, temperature 98.4 F (36.9 C), temperature source Oral, resp. rate 16, height 5' 4 (1.626 m), weight 83.9 kg, SpO2 100%.  Medical Problem List and Plan: 1. Functional deficits secondary to right proximal tibial fracture.  Status post ORIF 06/12/2024 per Dr. Kendal.    -TDWB RLE per Dr. Kendal op-note  -ACE wrap for support of Right knee  -patient may  shower  -ELOS/Goals: 7-10 days, mod I goals 2.  Antithrombotics: -DVT/anticoagulation:  Pharmaceutical: Lovenox  check vascular study.  Transition to aspirin  325 mg daily x 4 weeks outpatient on discharge  -antiplatelet therapy: N/A 3. Pain Management: Neurontin  400 mg 3 times daily, Robaxin  and oxycodone  as needed  -pain fairly well controlled 4. Mood/Behavior/Sleep: Zoloft  100 mg daily  -antipsychotic agents:  5. Neuropsych/cognition: This patient is capable of making decisions on her own behalf. 6. Skin/Wound Care: Routine skin checks 7.  Fluids/Electrolytes/Nutrition: Routine in and outs with follow-up chemistries 8.  Acute blood loss anemia.  Follow-up CBC 9.  Hypertension.  Cozaar  25 mg daily.  Monitor with increased mobility 10.  Diabetes mellitus with peripheral neuropathy.  Currently on SSI.  Patient on Glucophage 1000 mg twice daily prior to admission.  Resume as needed     Toribio JINNY Pitch, PA-C 06/15/2024

## 2024-06-15 NOTE — PMR Pre-admission (Signed)
 PMR Admission Coordinator Pre-Admission Assessment  Patient: Shirley Sullivan is an 63 y.o., female MRN: [id number] DOB: [birth year] Height: 5' 4 (162.6 cm) Weight: 83.9 kg  Insurance Information HMO:     PPO:      PCP:      IPA:      80/20:      OTHER:  PRIMARY: Medicare A & B      Policy#: [id number]      Subscriber: Patient  Eff. Date: 02/01/24     Deduct: $1676      Out of Pocket Max: does not have      Life Max: does not have CIR: 100%      SNF: Full 20 days Outpatient: 80%      Home Health: 100%       DME: 80%       SECONDARY: BCBS supplement      Policy#: [id number] Group #[id number]     Phone#: [phone]    The "Data Collection Information Summary" for patients in Inpatient Rehabilitation Facilities with attached "Privacy Act Statement-Health Care Records" was provided and verbally reviewed with: Patient and Family  Emergency Contact Information Contact Information     Name Relation Home Work [place] R Spouse [phone]  [phone]      Other Contacts   None on File     Current Medical History  Patient Admitting Diagnosis: R tibial plateau fracture History of Present Illness: Shirley Sullivan is a 63 year old right handed female with history of anxiety, asthma, depression, diabetes mellitus without complications, lupus and hypertension.  Per chart review patient lives with spouse and son.  1 level home 2 steps to entry.  Presented 06/12/2024 after mechanical fall landing on her right knee.  She had immediate pain.  X-rays showed a right.  Tibial plateau fracture.  Underwent ORIF of right proximal tibial fracture 06/12/2024 per Dr. Kendal.  Nonweightbearing right lower extremity.  She was cleared to begin Lovenox  for DVT prophylaxis.  Acute blood loss anemia 10.5.  Therapy evaluations completed due to patient's decreased functional mobility was admitted for a comprehensive rehab program.      Patient's medical record from [name] has been reviewed by  the rehabilitation admission coordinator and physician.  Past Medical History  Past Medical History:  Diagnosis Date   Allergy    Anxiety    Arthritis    Asthma    perfumes triggers attacks. Has inhalers for rescue   COVID-19 10/09/2020   and 08/15/20   Depression    Diabetes mellitus without complication (HCC)    History of kidney stones 1998, 2015   Lupus    Septic shock (HCC)    Klebsiella oxytocin and Enterobacter cloacae bacteremia/UTI    Has the patient had major surgery during 100 days prior to admission? Yes  Family History   family history includes Hyperlipidemia in her mother.  Current Medications  Current Facility-Administered Medications:    acetaminophen  (TYLENOL ) tablet 1,000 mg, 1,000 mg, Oral, Q8H, Perri DELENA Meliton Mickey., MD, 1,000 mg at 06/15/24 9474   clotrimazole  (GYNE-LOTRIMIN ) vaginal cream 1 Applicatorful, 1 Applicatorful, Vaginal, QHS, Perri DELENA Meliton Mickey., MD, 1 Applicatorful at 06/14/24 2123   clotrimazole  (LOTRIMIN ) 1 % cream, , Topical, BID, Perri DELENA Meliton Mickey., MD, Given at 06/15/24 [phone]   diphenhydrAMINE  (BENADRYL ) 12.5 MG/5ML elixir 12.5-25 mg, 12.5-25 mg, Oral, Q4H PRN, Danton Lauraine DELENA, PA-C   docusate sodium  (COLACE) capsule 100 mg, 100 mg, Oral, BID, McClung, Lauraine DELENA, PA-C,  100 mg at 06/13/24 2109   enoxaparin  (LOVENOX ) injection 40 mg, 40 mg, Subcutaneous, Q24H, Danton Lauraine LABOR, PA-C, 40 mg at 06/15/24 9152   gabapentin  (NEURONTIN ) capsule 400 mg, 400 mg, Oral, TID, Opyd, Timothy S, MD, 400 mg at 06/15/24 0846   HYDROmorphone  (DILAUDID ) injection 0.5-1 mg, 0.5-1 mg, Intravenous, Q4H PRN, Perri LABOR Meliton Mickey., MD, 1 mg at 06/13/24 1105   insulin  aspart (novoLOG ) injection 0-6 Units, 0-6 Units, Subcutaneous, Q4H, Opyd, Timothy S, MD, 1 Units at 06/14/24 2122   lip balm (CARMEX) ointment, , Topical, PRN, Opyd, Timothy S, MD   loratadine  (CLARITIN ) tablet 10 mg, 10 mg, Oral, Daily, Opyd, Timothy S, MD, 10 mg at 06/15/24 0846   losartan   (COZAAR ) tablet 25 mg, 25 mg, Oral, Daily, Opyd, Timothy S, MD, 25 mg at 06/15/24 0846   methocarbamol  (ROBAXIN ) tablet 500 mg, 500 mg, Oral, Q6H PRN, 500 mg at 06/14/24 2122 **OR** methocarbamol  (ROBAXIN ) injection 500 mg, 500 mg, Intravenous, Q6H PRN, McClung, Sarah A, PA-C   oxyCODONE  (Oxy IR/ROXICODONE ) immediate release tablet 7.5 mg, 7.5 mg, Oral, Q4H PRN **OR** oxyCODONE  (Oxy IR/ROXICODONE ) immediate release tablet 15 mg, 15 mg, Oral, Q4H PRN, Perri LABOR Meliton Mickey., MD, 15 mg at 06/15/24 0405   polyethylene glycol (MIRALAX  / GLYCOLAX ) packet 17 g, 17 g, Oral, Daily PRN, Opyd, Timothy S, MD   polyethylene glycol (MIRALAX  / GLYCOLAX ) packet 17 g, 17 g, Oral, BID, Powell, A Meliton Mickey., MD   prochlorperazine  (COMPAZINE ) injection 5 mg, 5 mg, Intravenous, Q6H PRN, Opyd, Timothy S, MD, 5 mg at 06/14/24 1540   sertraline  (ZOLOFT ) tablet 100 mg, 100 mg, Oral, QHS, Opyd, Timothy S, MD, 100 mg at 06/14/24 2122   sodium chloride  flush (NS) 0.9 % injection 3 mL, 3 mL, Intravenous, Q12H, Opyd, Timothy S, MD, 3 mL at 06/15/24 0848   Vitamin D  (Ergocalciferol ) (DRISDOL ) 1.25 MG (50000 UNIT) capsule 50,000 Units, 50,000 Units, Oral, Q7 days, Perri LABOR Meliton Mickey., MD  Patients Current Diet:  Diet Order             Diet Carb Modified Fluid consistency: Thin; Room service appropriate? Yes  Diet effective now                   Precautions / Restrictions Precautions Precautions: Fall Restrictions Weight Bearing Restrictions Per Provider Order: Yes RLE Weight Bearing Per Provider Order: Non weight bearing Other Position/Activity Restrictions: NWB RLE   Has the patient had 2 or more falls or a fall with injury in the past year? Yes  Prior Activity Level    Prior Functional Level Self Care: Did the patient need help bathing, dressing, using the toilet or eating? Independent  Indoor Mobility: Did the patient need assistance with walking from room to room (with or without device)?  Independent  Stairs: Did the patient need assistance with internal or external stairs (with or without device)? Independent  Functional Cognition: Did the patient need help planning regular tasks such as shopping or remembering to take medications? Independent  Patient Information Are you of Hispanic, Latino/a,or Spanish origin?: A. No, not of Hispanic, Latino/a, or Spanish origin What is your race?: A. White Do you need or want an interpreter to communicate with a doctor or health care staff?: 0. No  Patient's Response To:  Health Literacy and Transportation Is the patient able to respond to health literacy and transportation needs?: Yes Health Literacy - How often do you need to have someone help you when you  read instructions, pamphlets, or other written material from your doctor or pharmacy?: Never In the past 12 months, has lack of transportation kept you from medical appointments or from getting medications?: No In the past 12 months, has lack of transportation kept you from meetings, work, or from getting things needed for daily living?: No  Home Assistive Devices / Equipment Home Equipment: Crutches, Agricultural consultant (2 wheels), IT sales professional, Information systems manager, BSC/3in1  Prior Device Use: Indicate devices/aids used by the patient prior to current illness, exacerbation or injury? None of the above  Current Functional Level Cognition  Orientation Level: Oriented X4    Extremity Assessment (includes Sensation/Coordination)  Upper Extremity Assessment: Defer to OT evaluation  Lower Extremity Assessment: RLE deficits/detail RLE Deficits / Details: able to actively flex and extend knee, though short ranges, and difficulty extending against gravity    ADLs  Overall ADL's : Needs assistance/impaired Eating/Feeding: Set up, Sitting Grooming: Sitting, Minimal assistance, Brushing hair Upper Body Bathing: Moderate assistance Lower Body Bathing: Maximal assistance Upper Body Dressing :  Minimal assistance Lower Body Dressing: Maximal assistance Toilet Transfer: Moderate assistance, +2 for physical assistance Toileting- Clothing Manipulation and Hygiene: Maximal assistance Functional mobility during ADLs: Moderate assistance, +2 for physical assistance, Rolling walker (2 wheels)    Mobility  Overal bed mobility: Needs Assistance Bed Mobility: Supine to Sit Supine to sit: Mod assist General bed mobility comments: Step-by-step cues for technique; pt assisting RLE to EOB with use of gait belt loop; good use of LLE for half-bridging    Transfers  Overall transfer level: Needs assistance Equipment used: Rolling walker (2 wheels) Transfers: Bed to chair/wheelchair/BSC Sit to Stand: Mod assist, +2 physical assistance Bed to/from chair/wheelchair/BSC transfer type:: Lateral/scoot transfer Stand pivot transfers: Mod assist, +2 physical assistance  Lateral/Scoot Transfers: Mod assist General transfer comment: Opted to work on lateral scoot transfer bed to recliner for another option fro transfers; step-by-step cues for technique; good use of LLE as a pivot point; will consider sliding board    Ambulation / Gait / Stairs / Wheelchair Mobility       Posture / Balance Balance Overall balance assessment: Needs assistance Sitting-balance support: Feet supported Sitting balance-Leahy Scale: Good Standing balance support: During functional activity, Reliant on assistive device for balance Standing balance-Leahy Scale: Poor    Special needs/care consideration Skin operative wounds and Diabetic management     Previous Home Environment (from acute therapy documentation) Living Arrangements: Spouse/significant other, Children Available Help at Discharge: Family, Available 24 hours/day Type of Home: House Home Layout: One level Home Access: Stairs to enter Entrance Stairs-Rails: Right Entrance Stairs-Number of Steps: 2 Bathroom Shower/Tub: Museum/gallery conservator: Yes Home Care Services: No  Discharge Living Setting Plans for Discharge Living Setting: Patient's home, Lives with (comment) (Spouse) Discharge Home Layout: One level Discharge Home Access: Stairs to enter Entrance Stairs-Rails: Right Entrance Stairs-Number of Steps: 2 Discharge Bathroom Shower/Tub: Tub/shower unit Discharge Bathroom Toilet: Standard Does the patient have any problems obtaining your medications?: No  Social/Family/Support Systems Anticipated Caregiver: Spouse, Richard Anticipated Industrial/product designer Information: [phone] Ability/Limitations of Caregiver: can provide min A Caregiver Availability: 24/7 Discharge Plan Discussed with Primary Caregiver: Yes Is Caregiver In Agreement with Plan?: Yes Does Caregiver/Family have Issues with Lodging/Transportation while Pt is in Rehab?: No  Goals Patient/Family Goal for Rehab: Mod I PT, OT Expected length of stay: 7-10 days Pt/Family Agrees to Admission and willing to participate: Yes Program Orientation Provided & Reviewed with Pt/Caregiver Including Roles  &  Responsibilities: Yes  Barriers to Discharge: Insurance for SNF coverage  Decrease burden of Care through IP rehab admission: Othern/a  Possible need for SNF placement upon discharge: not anticipated  Patient Condition: I have reviewed medical records from [facility], spoken with TOC, and patient and spouse. I met with patient at the bedside for inpatient rehabilitation assessment.  Patient will benefit from ongoing PT and OT, can actively participate in 3 hours of therapy a day 5 days of the week, and can make measurable gains during the admission.  Patient will also benefit from the coordinated team approach during an Inpatient Acute Rehabilitation admission.  The patient will receive intensive therapy as well as Rehabilitation physician, nursing, social worker, and care management interventions.  Due to safety, skin/wound care,  disease management, medication administration, pain management, and patient education the patient requires 24 hour a day rehabilitation nursing.  The patient is currently Mod A with mobility and basic ADLs.  Discharge setting and therapy post discharge at home with home health is anticipated.  Patient has agreed to participate in the Acute Inpatient Rehabilitation Program and will admit today.  Preadmission Screen Completed By:  COSMO VEAR PLATTER, 06/15/2024 11:18 AM ______________________________________________________________________   Discussed status with Dr. Babs on 06/15/24 at 9:00am and received approval for admission today.  Admission Coordinator:  COSMO VEAR PLATTER, time 11:25 am /Date 06/15/24   Assessment/Plan: Diagnosis: right tib plateau fx Does the need for close, 24 hr/day Medical supervision in concert with the patient's rehab needs make it unreasonable for this patient to be served in a less intensive setting? Yes Co-Morbidities requiring supervision/potential complications: anxiety, depression, diabetes, lupus Due to bladder management, bowel management, safety, skin/wound care, disease management, medication administration, pain management, and patient education, does the patient require 24 hr/day rehab nursing? Yes Does the patient require coordinated care of a physician, rehab nurse, PT, OT to address physical and functional deficits in the context of the above medical diagnosis(es)? Yes Addressing deficits in the following areas: balance, endurance, locomotion, strength, transferring, bowel/bladder control, bathing, dressing, feeding, grooming, toileting, and psychosocial support Can the patient actively participate in an intensive therapy program of at least 3 hrs of therapy 5 days a week? Yes The potential for patient to make measurable gains while on inpatient rehab is excellent Anticipated functional outcomes upon discharge from inpatient rehab: modified independent PT,  modified independent OT, n/a SLP Estimated rehab length of stay to reach the above functional goals is: 7-10 days Anticipated discharge destination: Home 10. Overall Rehab/Functional Prognosis: excellent   MD Signature: Arthea IVAR Babs, MD, [facility] Physical Medicine & Rehabilitation Medical Director Rehabilitation Services 06/15/2024

## 2024-06-15 NOTE — Progress Notes (Signed)
 Inpatient Rehab Coordinator Note:  I met with patient and spouse at bedside to discuss CIR recommendations and goals/expectations of CIR stay.  We reviewed 3 hrs/day of therapy, physician follow up, and average length of stay 2 weeks (dependent upon progress) with goals of mod I. Patient is agreeable to CIR and spouse is available 24/7. Will move forward with admitting patient when ready for discharge.   Rehab Admissons Coordinator Shaquon Gropp, [place], [place] [phone]

## 2024-06-15 NOTE — Discharge Summary (Signed)
 Physician Discharge Summary  Patient ID: Shirley Sullivan MRN: [id number] DOB/AGE: 1961-05-16 63 y.o.  Admit date: 06/15/2024 Discharge date: 06/26/2024  Discharge Diagnoses:  Principal Problem:   Right tibial fracture Active Problems:   Depression with anxiety DVT prophylaxis Mood stabilization Acute blood loss anemia Hypertension Diabetes mellitus with peripheral neuropathy Lupus Asthma  Discharged Condition: Stable  Significant Diagnostic Studies: DG Tibia/Fibula Right Result Date: 06/23/2024 CLINICAL DATA:  Lower extremity pain EXAM: RIGHT TIBIA AND FIBULA - 2 VIEW COMPARISON:  06/12/2024 FINDINGS: Plate fixation tibial plateau fracture. Fracture of proximal fibula again noted. No distal fracture evident. IMPRESSION: 1. No acute fracture identified. 2. Proximal fibular fracture noted. 3. Stable internal fixation of the tibial plateau fracture. Electronically Signed   By: Jackquline Boxer M.D.   On: 06/23/2024 16:00   VAS US  LOWER EXTREMITY VENOUS (DVT) Result Date: 06/20/2024  Lower Venous DVT Study Patient Name:  Shirley Sullivan  Date of Exam:   06/19/2024 Medical Rec #: [id number]      Accession #:    [id number] Date of Birth: 02-04-1961       Patient Gender: F Patient Age:   37 years Exam Location:  [facility] Procedure:      VAS US  LOWER EXTREMITY VENOUS (DVT) Referring Phys: MURRAY COLLIER --------------------------------------------------------------------------------  Indications: Swelling, and Rehab patient. Other Indications: Rehab patient. Risk Factors: Fall on 06/12/2024 with right tibial plateau fracture s/p ORIF of right proximal tibial fracture. Comparison Study: Previous exam on 06/15/2024 was negative for DVT. Performing Technologist: Ezzie Potters RVT, RDMS  Examination Guidelines: A complete evaluation includes B-mode imaging, spectral Doppler, color Doppler, and power Doppler as needed of all accessible portions of each vessel. Bilateral testing is considered an integral  part of a complete examination. Limited examinations for reoccurring indications may be performed as noted. The reflux portion of the exam is performed with the patient in reverse Trendelenburg.  +---------+---------------+---------+-----------+----------+--------------+ RIGHT    CompressibilityPhasicitySpontaneityPropertiesThrombus Aging +---------+---------------+---------+-----------+----------+--------------+ CFV      Full           Yes      Yes                                 +---------+---------------+---------+-----------+----------+--------------+ SFJ      Full                                                        +---------+---------------+---------+-----------+----------+--------------+ FV Prox  Full           Yes      Yes                                 +---------+---------------+---------+-----------+----------+--------------+ FV Mid   Full           Yes      Yes                                 +---------+---------------+---------+-----------+----------+--------------+ FV DistalFull           Yes      Yes                                 +---------+---------------+---------+-----------+----------+--------------+  PFV      Full                                                        +---------+---------------+---------+-----------+----------+--------------+ POP      Full           Yes      Yes                                 +---------+---------------+---------+-----------+----------+--------------+ PTV      Full                                                        +---------+---------------+---------+-----------+----------+--------------+ PERO     Full                                                        +---------+---------------+---------+-----------+----------+--------------+   +----+---------------+---------+-----------+----------+--------------+ LEFTCompressibilityPhasicitySpontaneityPropertiesThrombus Aging  +----+---------------+---------+-----------+----------+--------------+ CFV Full           Yes      Yes                                 +----+---------------+---------+-----------+----------+--------------+    Summary: RIGHT: - There is no evidence of deep vein thrombosis in the lower extremity.  - No cystic structure found in the popliteal fossa. Subcutaneous edema of calf and ankle.  LEFT: - No evidence of common femoral vein obstruction.   *See table(s) above for measurements and observations. Electronically signed by Fonda Rim on 06/20/2024 at 9:57:09 AM.    Final    VAS US  LOWER EXTREMITY VENOUS (DVT) Result Date: 06/15/2024  Lower Venous DVT Study Patient Name:  Shirley Sullivan  Date of Exam:   06/15/2024 Medical Rec #: [id number]      Accession #:    [id number] Date of Birth: 07-Feb-1961       Patient Gender: F Patient Age:   45 years Exam Location:  [place] Procedure:      VAS US  LOWER EXTREMITY VENOUS (DVT) Referring Phys: TORIBIO PITCH --------------------------------------------------------------------------------  Indications: edema. Other Indications: Rehab patient. Risk Factors: Fall on 06/12/2024 with right tibial plateau fracture s/p ORIF of right proximal tibial fracture. Limitations: Poor ultrasound/tissue interface. Comparison Study: Previous exam on 10/15/2022 was negative for DVT Performing Technologist: Ezzie Potters RVT, RDMS  Examination Guidelines: A complete evaluation includes B-mode imaging, spectral Doppler, color Doppler, and power Doppler as needed of all accessible portions of each vessel. Bilateral testing is considered an integral part of a complete examination. Limited examinations for reoccurring indications may be performed as noted. The reflux portion of the exam is performed with the patient in reverse Trendelenburg.  +---------+---------------+---------+-----------+----------+--------------+ RIGHT    CompressibilityPhasicitySpontaneityPropertiesThrombus  Aging +---------+---------------+---------+-----------+----------+--------------+ CFV      Full           Yes      Yes                                 +---------+---------------+---------+-----------+----------+--------------+  SFJ      Full                                                        +---------+---------------+---------+-----------+----------+--------------+ FV Prox  Full           Yes      Yes                                 +---------+---------------+---------+-----------+----------+--------------+ FV Mid   Full           Yes      Yes                                 +---------+---------------+---------+-----------+----------+--------------+ FV DistalFull           Yes      Yes                                 +---------+---------------+---------+-----------+----------+--------------+ PFV      Full                                                        +---------+---------------+---------+-----------+----------+--------------+ POP      Full           Yes      Yes                                 +---------+---------------+---------+-----------+----------+--------------+ PTV      Full                                                        +---------+---------------+---------+-----------+----------+--------------+ PERO     Full                                                        +---------+---------------+---------+-----------+----------+--------------+   +---------+---------------+---------+-----------+----------+-------------------+ LEFT     CompressibilityPhasicitySpontaneityPropertiesThrombus Aging      +---------+---------------+---------+-----------+----------+-------------------+ CFV      Full           Yes      Yes                                      +---------+---------------+---------+-----------+----------+-------------------+ SFJ      Full                                                              +---------+---------------+---------+-----------+----------+-------------------+  FV Prox  Full           Yes      Yes                                      +---------+---------------+---------+-----------+----------+-------------------+ FV Mid   Full           Yes      Yes                                      +---------+---------------+---------+-----------+----------+-------------------+ FV DistalFull           Yes      Yes                                      +---------+---------------+---------+-----------+----------+-------------------+ PFV      Full                                                             +---------+---------------+---------+-----------+----------+-------------------+ POP      Full           Yes      Yes                                      +---------+---------------+---------+-----------+----------+-------------------+ PTV      Full                                                             +---------+---------------+---------+-----------+----------+-------------------+ PERO                                                  Not well visualized +---------+---------------+---------+-----------+----------+-------------------+     Summary: BILATERAL: - No evidence of deep vein thrombosis seen in the lower extremities, bilaterally. -No evidence of popliteal cyst, bilaterally.   *See table(s) above for measurements and observations. Electronically signed by Fonda Rim on 06/15/2024 at 6:45:06 PM.    Final    DG Knee Right Port Result Date: 06/12/2024 CLINICAL DATA:  Fracture, postop. EXAM: PORTABLE RIGHT KNEE - 1-2 VIEW COMPARISON:  Preoperative imaging FINDINGS: Lateral plate and screw fixation of proximal tibial fracture. Unchanged fracture alignment. Proximal fibular fracture is in unchanged alignment. Recent postsurgical change includes air and edema in the soft tissues. IMPRESSION: ORIF of proximal tibial fracture without immediate  postoperative complication. Electronically Signed   By: Andrea Gasman M.D.   On: 06/12/2024 18:06   DG Tibia/Fibula Right Result Date: 06/12/2024 CLINICAL DATA:  Elective surgery. EXAM: RIGHT TIBIA AND FIBULA - 2 VIEW COMPARISON:  Preoperative imaging FINDINGS: Five fluoroscopic spot views of the right proximal tibia and fibula submitted from the operating room.  Lateral plate and screw fixation of proximal tibial fracture. Again seen proximal fibular fracture. Fluoroscopy time 56.9 seconds. Dose 2.39 mGy. IMPRESSION: Intraoperative fluoroscopy during proximal tibial fracture fixation. Electronically Signed   By: Andrea Gasman M.D.   On: 06/12/2024 18:05   DG C-Arm 1-60 Min-No Report Result Date: 06/12/2024 Fluoroscopy was utilized by the requesting physician.  No radiographic interpretation.   CT Knee Right Wo Contrast Result Date: 06/12/2024 CLINICAL DATA:  Knee trauma with dislocation suspected. Fracture seen on recent radiographs. EXAM: CT OF THE RIGHT KNEE WITHOUT CONTRAST TECHNIQUE: Multidetector CT imaging of the right knee was performed according to the standard protocol. Multiplanar CT image reconstructions were also generated. RADIATION DOSE REDUCTION: This exam was performed according to the departmental dose-optimization program which includes automated exposure control, adjustment of the mA and/or kV according to patient size and/or use of iterative reconstruction technique. COMPARISON:  Right knee radiograph 06/12/2024 FINDINGS: Bones/Joint/Cartilage Comminuted fractures of the proximal tibial metaphysis with fracture lines extending to the medial tibial plateau. Transverse comminuted fractures of the proximal fibular neck without significant displacement. Small right knee effusion. Underlying degenerative changes in the right knee with medial compartment narrowing and small osteophyte formation. Chronic osteochondral defects in the medial tibial plateau. Old osteochondral defect in the  anterior medial femoral condyle with loose body in-situ. No dislocation. Ligaments Suboptimally assessed by CT. Muscles and Tendons No intramuscular mass or hematoma. Soft tissues Soft tissue infiltration over the anterior and medial infrapatellar region consistent with hematoma and contusion. IMPRESSION: 1. Comminuted fractures of the proximal tibial metaphysis with extension to the medial tibial plateau. 2. Comminuted transverse fractures of the proximal fibular shaft. 3. Mild effusion. 4. Infiltration in the anterior soft tissues consistent with contusion and hematoma. 5. Underlying degenerative changes in the medial compartment. Electronically Signed   By: Elsie Gravely M.D.   On: 06/12/2024 02:27   DG Knee Complete 4 Views Right Result Date: 06/12/2024 CLINICAL DATA:  Pain after a fall. EXAM: RIGHT KNEE - COMPLETE 4+ VIEW COMPARISON:  None Available. FINDINGS: Depressed intra-articular fracture of medial tibial plateau with fracture lines extending anterior to posterior through the metaphysis. Transverse comminuted fracture of the proximal fibular shaft. Soft tissue swelling in the upper aspect of the lower leg. No significant effusion. No dislocation at the knee joint. IMPRESSION: Comminuted and depressed intra-articular fracture of the medial tibial plateau. Mildly comminuted transverse fracture of the proximal fibular shaft. Electronically Signed   By: Elsie Gravely M.D.   On: 06/12/2024 00:33   DG Hip Unilat W or Wo Pelvis 2-3 Views Right Result Date: 06/12/2024 CLINICAL DATA:  Pain after a fall EXAM: DG HIP (WITH OR WITHOUT PELVIS) 2-3V RIGHT COMPARISON:  None Available. FINDINGS: Degenerative changes in the lower lumbar spine and in both hips. Pelvis and right hip appear intact. No acute fracture or dislocation. No focal bone lesion or bone destruction. Soft tissues are unremarkable. Tubing in the lower abdomen likely representing reservoir for gastric lap band. IMPRESSION: Degenerative  changes in the right hip. No acute displaced fractures are identified. Electronically Signed   By: Elsie Gravely M.D.   On: 06/12/2024 00:32    Labs:  Basic Metabolic Panel: Recent Labs  Lab 06/22/24 0508  NA 141  K 4.6  CL 105  CO2 28  GLUCOSE 132*  BUN 10  CREATININE 0.71  CALCIUM 8.9    CBC: Recent Labs  Lab 06/22/24 0508  WBC 3.3*  HGB 9.7*  HCT 30.1*  MCV 89.9  PLT 184    CBG: Recent Labs  Lab 06/23/24 2054 06/24/24 0626 06/24/24 1139 06/24/24 1656 06/24/24 2046  GLUCAP 121* 121* 110* 104* 141*   Family history.  Mother with hyperlipidemia.  Denies any colon cancer or esophageal cancer or rectal cancer  Brief HPI:   Shirley Sullivan is a 63 y.o. right-handed female with history significant for anxiety asthma depression diabetes mellitus with peripheral neuropathy lupus and hypertension.  Per chart review patient lives with spouse and son.  1 level home 2 steps to entry.  Presented 06/12/2024 after mechanical fall landing on her right knee.  She had immediate pain.  X-ray showed a right tibial plateau fracture.  Underwent ORIF of right proximal tibial fracture 06/12/2024 per Dr. Kendal.  Nonweightbearing right lower extremity.  She was cleared to begin Lovenox  for DVT prophylaxis transitioning to aspirin  325 mg daily x 4 weeks on discharge.  Acute blood loss anemia 10.5.  Therapy evaluations completed due to patient's decreased functional mobility was admitted for a comprehensive rehab program.   Hospital Course: JERMISHA HOFFART was admitted to rehab 06/15/2024 for inpatient therapies to consist of PT, ST and OT at least three hours five days a week. Past admission physiatrist, therapy team and rehab RN have worked together to provide customized collaborative inpatient rehab.  Pertaining to patient's right proximal tibial fracture.  Status post ORIF 06/12/2024 per Dr. Kendal.  Touchdown weightbearing right lower extremity.  Ace wrap for support of right knee.  Follow-up  x-ray 06/03/2024 unremarkable.  She did have some mild redness around the incision line placed empirically on Keflex .  Maintain on Lovenox  for DVT prophylaxis venous Doppler studies negative transitioning to aspirin  325 mg daily x 4 weeks on discharge.  Pain management with use of Neurontin  400 mg 3 times daily with Robaxin  oxycodone  as needed.  Mood stabilization maintained on Zoloft  she was attending full therapies with emotional support provided.  Acute blood loss anemia stable remained asymptomatic.  Blood pressure controlled on Cozaar  monitoring with increased mobility follow-up outpatient.  Diabetes mellitus peripheral neuropathy with hemoglobin A1c 5.8 on SSI patient had been on Glucophage 1000 mg twice daily prior to admission resumed as needed and follow-up PCP with blood sugars currently well-controlled 121-159.   Blood pressures were monitored on TID basis and control were monitored  Diabetes has been monitored with ac/hs CBG checks and SSI was use prn for tighter BS control.    Rehab course: During patient's stay in rehab weekly team conferences were held to monitor patient's progress, set goals and discuss barriers to discharge. At admission, patient required mod assist stand pivot transfers moderate assist sit to stand  He/She  has had improvement in activity tolerance, balance, postural control as well as ability to compensate for deficits. He/She has had improvement in functional use RUE/LUE  and RLE/LLE as well as improvement in awareness.  Roll left to right supervision.  Sit to lying assist level contact-guard.  Sit to stand assist level minimal assist.  Chair to bed transfers moderate assist.  Propels wheelchair independently.  Ambulates short distances maintaining weightbearing precautions.  Set up upper body dressing min mod assist lower body ADLs.  Full family teaching completed plan discharge to home       Disposition: Discharge disposition: 06-Home-Health Care  Svc        Diet: Diabetic diet  Special Instructions: No driving smoking or alcohol  Nonweightbearing right lower extremity  Hold Fosamax until follow-up with orthopedic services  Medications at  discharge 1.  Aspirin  325 mg daily x 30 days 2.  Tylenol  as needed 3.  Colace 100 mg twice daily hold for loose stools 4.  Neurontin  400 mg 3 times daily 5.  Lidoderm  patch changes directed 6.  Cozaar  12.5 mg p.o. daily 7.  Robaxin  500 mg every 6 hours as needed muscle spasms 8.  Oxycodone  5  mg every 4 hours as needed pain 9.  MiraLAX  daily as needed hold for loose stools 10.  Zoloft  100 mg p.o. nightly 11.  Vitamin D  50,000 units every 7 days for 7 doses 12.  Xanax  0.5 mg twice daily as needed anxiety 13.  Keflex  500 mg every 8 hours x 4 days 14.  Zyrtec  10 mg nightly 15.  Zofran  4 mg every 8 hours as needed nausea     30-35 minutes were spent completing discharge summary and discharge planning    Follow-up Information     Urbano Albright, MD Follow up.   Specialty: Physical Medicine and Rehabilitation Why: No formal follow-up needed Contact information: [address] [place] [address] [phone]         Kendal Franky SQUIBB, MD Follow up.   Specialty: Orthopedic Surgery Why: Call for appointment Contact information: [address] [place] [address] [phone]         Montey Presto, MD Follow up.   Specialty: Family Medicine Why: Call for appointment Contact information: [address] [place] [address] [phone]                 Signed: Toribio PARAS Anouk Critzer 06/25/2024, 4:45 AM

## 2024-06-16 ENCOUNTER — Encounter (HOSPITAL_COMMUNITY): Payer: Self-pay | Admitting: Student

## 2024-06-16 DIAGNOSIS — S82101D Unspecified fracture of upper end of right tibia, subsequent encounter for closed fracture with routine healing: Secondary | ICD-10-CM | POA: Diagnosis not present

## 2024-06-16 DIAGNOSIS — R7401 Elevation of levels of liver transaminase levels: Secondary | ICD-10-CM | POA: Diagnosis not present

## 2024-06-16 DIAGNOSIS — I10 Essential (primary) hypertension: Secondary | ICD-10-CM | POA: Diagnosis not present

## 2024-06-16 DIAGNOSIS — D62 Acute posthemorrhagic anemia: Secondary | ICD-10-CM | POA: Diagnosis not present

## 2024-06-16 LAB — CBC WITH DIFFERENTIAL/PLATELET
Abs Immature Granulocytes: 0.01 K/uL (ref 0.00–0.07)
Basophils Absolute: 0 K/uL (ref 0.0–0.1)
Basophils Relative: 1 %
Eosinophils Absolute: 0.1 K/uL (ref 0.0–0.5)
Eosinophils Relative: 4 %
HCT: 29.9 % — ABNORMAL LOW (ref 36.0–46.0)
Hemoglobin: 10 g/dL — ABNORMAL LOW (ref 12.0–15.0)
Immature Granulocytes: 0 %
Lymphocytes Relative: 34 %
Lymphs Abs: 1.3 K/uL (ref 0.7–4.0)
MCH: 29.4 pg (ref 26.0–34.0)
MCHC: 33.4 g/dL (ref 30.0–36.0)
MCV: 87.9 fL (ref 80.0–100.0)
Monocytes Absolute: 0.3 K/uL (ref 0.1–1.0)
Monocytes Relative: 9 %
Neutro Abs: 2.1 K/uL (ref 1.7–7.7)
Neutrophils Relative %: 52 %
Platelets: 147 K/uL — ABNORMAL LOW (ref 150–400)
RBC: 3.4 MIL/uL — ABNORMAL LOW (ref 3.87–5.11)
RDW: 14.5 % (ref 11.5–15.5)
WBC: 3.9 K/uL — ABNORMAL LOW (ref 4.0–10.5)
nRBC: 0 % (ref 0.0–0.2)

## 2024-06-16 LAB — COMPREHENSIVE METABOLIC PANEL WITH GFR
ALT: 22 U/L (ref 0–44)
AST: 42 U/L — ABNORMAL HIGH (ref 15–41)
Albumin: 2.6 g/dL — ABNORMAL LOW (ref 3.5–5.0)
Alkaline Phosphatase: 43 U/L (ref 38–126)
Anion gap: 11 (ref 5–15)
BUN: 13 mg/dL (ref 8–23)
CO2: 27 mmol/L (ref 22–32)
Calcium: 9 mg/dL (ref 8.9–10.3)
Chloride: 101 mmol/L (ref 98–111)
Creatinine, Ser: 0.6 mg/dL (ref 0.44–1.00)
GFR, Estimated: >60 mL/min (ref >60)
Glucose, Bld: 144 mg/dL — ABNORMAL HIGH (ref 70–99)
Potassium: 3.6 mmol/L (ref 3.5–5.1)
Sodium: 139 mmol/L (ref 135–145)
Total Bilirubin: 0.7 mg/dL (ref 0.0–1.2)
Total Protein: 6.4 g/dL — ABNORMAL LOW (ref 6.5–8.1)

## 2024-06-16 LAB — GLUCOSE, CAPILLARY
Glucose-Capillary: 142 mg/dL — ABNORMAL HIGH (ref 70–99)
Glucose-Capillary: 134 mg/dL — ABNORMAL HIGH (ref 70–99)
Glucose-Capillary: 111 mg/dL — ABNORMAL HIGH (ref 70–99)
Glucose-Capillary: 136 mg/dL — ABNORMAL HIGH (ref 70–99)

## 2024-06-16 MED ORDER — INSULIN ASPART 100 UNIT/ML IJ SOLN
0.0000 [IU] | Freq: Three times a day (TID) | INTRAMUSCULAR | Status: DC
  Administered 2024-06-23: 1 [IU] via SUBCUTANEOUS

## 2024-06-16 NOTE — Progress Notes (Signed)
 Inpatient Rehabilitation  Patient information reviewed and entered into eRehab system by [facility]. Karen Kays., CCC/SLP, PPS Coordinator.  Information including medical coding, functional ability and quality indicators will be reviewed and updated through discharge.

## 2024-06-16 NOTE — Evaluation (Signed)
 Physical Therapy Assessment and Plan  Patient Details  Name: Shirley Sullivan MRN: [id number] Date of Birth: 05-31-1961  PT Diagnosis: Abnormality of gait, Difficulty walking, Edema, Impaired sensation, and Muscle weakness Rehab Potential: Fair ELOS: 7-10 days    Today's Date: 06/16/2024 PT Individual Time: 0915-1030 PT Individual Time Calculation (min): 75 min    Hospital Problem: Principal Problem:   Right tibial fracture   Past Medical History:  Past Medical History:  Diagnosis Date   Allergy    Anxiety    Arthritis    Asthma    perfumes triggers attacks. Has inhalers for rescue   COVID-19 10/09/2020   and 08/15/20   Depression    Diabetes mellitus without complication (HCC)    History of kidney stones 1998, 2015   Lupus    Septic shock (HCC)    Klebsiella oxytocin and Enterobacter cloacae bacteremia/UTI   Past Surgical History:  Past Surgical History:  Procedure Laterality Date   ABDOMINAL HYSTERECTOMY  2004   APPENDECTOMY  1992   CYSTOSCOPY WITH RETROGRADE PYELOGRAM, URETEROSCOPY AND STENT PLACEMENT Left 08/11/2014   Procedure: CYSTOSCOPY WITH RETROGRADE PYELOGRAM, URETEROSCOPY AND STENT PLACEMENT,  DIGITAL FLEXIBLE URETEROSCOPE;  Surgeon: Gretel Ferrara, MD;  Location: [facility];  Service: Urology;  Laterality: Left;  request digital flexible ureteroscope   CYSTOSCOPY WITH RETROGRADE PYELOGRAM, URETEROSCOPY AND STENT PLACEMENT Left 08/23/2014   Procedure: CYSTOSCOPY WITH RETROGRADE PYELOGRAM, URETEROSCOPY AND STENT EXCHANGE;  Surgeon: Gretel Ferrara, MD;  Location: [facility];  Service: Urology;  Laterality: Left;   CYSTOSCOPY/URETEROSCOPY/HOLMIUM LASER/STENT PLACEMENT Right 11/02/2020   Procedure: CYSTOSCOPY/URETEROSCOPY/HOLMIUM LASER/STENT PLACEMENT/ REMOVAL OF RIGHT NEPHROSTOMY TUBE;  Surgeon: Ferrara Gretel, MD;  Location: [facility];  Service: Urology;  Laterality: Right;  patient will not need to be covid tested, tested positive on 11/14/20 has proof   HOLMIUM LASER APPLICATION  Left 08/23/2014   Procedure: HOLMIUM LASER APPLICATION;  Surgeon: Gretel Ferrara, MD;  Location: [facility];  Service: Urology;  Laterality: Left;   IR NEPHROSTOMY EXCHANGE RIGHT  10/17/2020   IR NEPHROSTOMY PLACEMENT RIGHT  10/10/2020   ROTATOR CUFF REPAIR Right 2007    Assessment & Plan Clinical Impression: Patient is a 63 y.o. year old female with history of anxiety, asthma, depression, diabetes mellitus without complications, lupus and hypertension. Per chart review patient lives with spouse and son. 1 level home 2 steps to entry. Presented 06/12/2024 after mechanical fall landing on her right knee. She had immediate pain. X-rays showed a right. Tibial plateau fracture. Underwent ORIF of right proximal tibial fracture 06/12/2024 per Dr. Kendal. Nonweightbearing right lower extremity. She was cleared to begin Lovenox  for DVT prophylaxis transitioning to aspirin  325 mg daily x 4 weeks on discharge. Acute blood loss anemia 10.5. Therapy evaluations completed due to patient's decreased functional mobility was admitted for a comprehensive rehab program.    Patient currently requires mod with mobility secondary to muscle weakness and muscle joint tightness, decreased cardiorespiratoy endurance, and decreased sitting balance, decreased standing balance, decreased balance strategies, and difficulty maintaining precautions.  Prior to hospitalization, patient was required HHA in community but otherwise independent with no AD with mobility and lived with Spouse, Son in a House home, however pt has had 2 falls in the last 6 months. Home access is 2Stairs to enter.  Patient will benefit from skilled PT intervention to maximize safe functional mobility, minimize fall risk, and decrease caregiver burden for planned discharge home with 24 hour supervision.  Anticipate patient will benefit from follow up HH at discharge.  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), two steps for entry into house PT Patient demonstrates impairments in the following area(s): Balance;Behavior;Edema;Endurance;Motor;Pain;Safety;Sensory;Skin Integrity PT Transfers Functional Problem(s): Bed Mobility;Bed to Chair;Car PT Locomotion Functional Problem(s): Wheelchair Mobility;Ambulation PT Plan PT Intensity: Minimum of 1-2 x/day ,45 to 90 minutes PT Frequency: 5 out of 7 days PT Duration Estimated Length of Stay: 7-10 days PT Treatment/Interventions: Ambulation/gait training;Community reintegration;DME/adaptive equipment instruction;Neuromuscular re-education;Psychosocial support;Stair training;UE/LE Strength taining/ROM;Wheelchair propulsion/positioning;Balance/vestibular training;Discharge planning;Pain management;Skin care/wound management;Therapeutic Activities;UE/LE Coordination activities;Cognitive remediation/compensation;Disease management/prevention;Functional mobility training;Patient/family education;Splinting/orthotics;Therapeutic Exercise;Visual/perceptual remediation/compensation PT Transfers Anticipated Outcome(s): supervision PT Locomotion Anticipated Outcome(s): supervision PT Recommendation Follow Up Recommendations: Home health PT Patient destination: Home Equipment Recommended: To be determined   PT Evaluation Precautions/Restrictions Precautions Precautions: Fall Recall of Precautions/Restrictions: Intact Precaution/Restrictions Comments: pt aware of precautions however poor implementation and poor decision making. Restrictions Weight Bearing Restrictions Per Provider Order: Yes RLE Weight Bearing Per Provider Order: Non weight bearing Other Position/Activity  Restrictions: NWB RLE Pain Interference Pain Interference Pain Effect on Sleep: 4. Almost constantly Pain Interference with Therapy Activities: 1. Rarely or not at all Pain Interference with Day-to-Day Activities: 4. Almost constantly Home Living/Prior Functioning Home Living Living Arrangements: Spouse/significant other Available Help at Discharge: Family;Available 24 hours/day Type of Home: House Home Access: Stairs to enter [employer] of Steps: 2 Entrance Stairs-Rails: Right Home Layout: One level Bathroom Shower/Tub: Tub/shower unit (has transfer tub bench) Bathroom Toilet: Standard Bathroom Accessibility: Yes Additional Comments: 2 falls in the past 6 months, one resulting in this hospital stay, the other occured 6 weeks ago and resulted in a concussion. Both of which occured with stair navigation without UE support resulting in R LE buckling. Pt and son are planning to install ramp prior to D/C.  Lives With: Spouse;Son Prior Function Level of Independence: Independent with transfers;Independent with basic ADLs;Needs assistance with gait  Able to Take Stairs?: Yes Driving: Yes Vocation: Retired Vision/Perception  Vision - History Ability to See in Adequate Light: 0 Adequate Perception Perception: Within Functional Limits Praxis Praxis: WFL  Cognition Overall Cognitive Status: Within Functional Limits for tasks assessed Arousal/Alertness: Awake/alert Orientation Level: Oriented X4 Year: 2025 Month: July Day of Week: Correct Memory: Impaired Memory Impairment: Decreased recall of new information Awareness: Impaired Problem Solving: Appears intact Behaviors: Impulsive Safety/Judgment: Impaired Comments: pt admits to getting OOB x2 on her own to [facility] despite instructions not to. Pt demonstrates poor adherence to R LE NWBing without max verbal cues. Pt initially attempting to perform 180 deg turn to [facility] upon PT entry. Sensation Sensation Light Touch:  Impaired by gross assessment Additional Comments: B LE neuropathy at baseline, otherwise light touch in tact Coordination Gross Motor Movements are Fluid and Coordinated: No Fine Motor Movements are Fluid and Coordinated: Yes Coordination and Movement Description: R LE NWBing Motor  Motor Motor: Other (comment) Motor - Skilled Clinical Observations: generalized debility  Trunk/Postural Assessment  Cervical Assessment Cervical Assessment: Exceptions to [facility] (forward head) Thoracic Assessment Thoracic Assessment: Exceptions to [facility] (sight kyphosis) Lumbar Assessment Lumbar Assessment: Within Functional Limits Postural Control Postural Control: Within Functional Limits  Balance Balance Balance Assessed: Yes Static Sitting Balance Static Sitting - Balance Support: Feet supported Static Sitting - Level of Assistance: 5: Stand by assistance Dynamic Sitting Balance Dynamic  Sitting - Balance Support: Feet supported;During functional activity Dynamic Sitting - Level of Assistance: 5: Stand by assistance (CGA) Dynamic Sitting - Balance Activities: Lateral lean/weight shifting;Forward lean/weight shifting;Reaching for objects;Reaching across midline Sitting balance - Comments: CGA Static Standing Balance Static Standing - Balance Support: Right upper extremity supported;Left upper extremity supported Static Standing - Level of Assistance: 5: Stand by assistance Static Standing - Comment/# of Minutes: CGA Dynamic Standing Balance Dynamic Standing - Balance Support: During functional activity;Right upper extremity supported;Left upper extremity supported Dynamic Standing - Level of Assistance: 3: Mod assist Dynamic Standing - Balance Activities: Lateral lean/weight shifting Dynamic Standing - Comments: squat pivot transfer Extremity Assessment  RUE Assessment RUE Assessment: Within Functional Limits Active Range of Motion (AROM) Comments: WFL General Strength Comments: 5/5 LUE  Assessment LUE Assessment: Within Functional Limits Active Range of Motion (AROM) Comments: WFL General Strength Comments: 4/5 RLE Assessment RLE Assessment: Exceptions to [facility] Passive Range of Motion (PROM) Comments: empty end feel with passive R LE knee extension seated EOB Active Range of Motion (AROM) Comments: active knee extension ROM limited sitting EOB 2/2 pain; supine knee extension lacking ~10 deg; knee flexion seated EOB ~95 deg, knee flexion while supine ~45 deg. LLE Assessment LLE Assessment: Exceptions to [facility] General Strength Comments: grossly 4-/5  Care Tool Care Tool Bed Mobility Roll left and right activity   Roll left and right assist level: Supervision/Verbal cueing    Sit to lying activity   Sit to lying assist level: Contact Guard/Touching assist    Lying to sitting on side of bed activity   Lying to sitting on side of bed assist level: the ability to move from lying on the back to sitting on the side of the bed with no back support.: Contact Guard/Touching assist     Care Tool Transfers Sit to stand transfer   Sit to stand assist level: Minimal Assistance - Patient > 75%    Chair/bed transfer   Chair/bed transfer assist level: Moderate Assistance - Patient 50 - 74%    Car transfer Car transfer activity did not occur: (P) Safety/medical concerns (fatigue)        Care Tool Locomotion Ambulation Ambulation activity did not occur: (P) Safety/medical concerns (pain/fatigue/nausea)        Walk 10 feet activity Walk 10 feet activity did not occur: (P) Safety/medical concerns (pain/fatigue/nausea)       Walk 50 feet with 2 turns activity Walk 50 feet with 2 turns activity did not occur: (P) Safety/medical concerns (pain/fatigue/nausea)      Walk 150 feet activity Walk 150 feet activity did not occur: (P) Safety/medical concerns (pain/fatigue/nausea)      Walk 10 feet on uneven surfaces activity Walk 10 feet on uneven surfaces activity did not occur: (P)  Safety/medical concerns (pain/fatigue/nausea)      Stairs Stair activity did not occur: (P) Safety/medical concerns (pain/fatigue/nausea)        Walk up/down 1 step activity Walk up/down 1 step or curb (drop down) activity did not occur: (P) Safety/medical concerns (pain/fatigue/nausea)      Walk up/down 4 steps activity Walk up/down 4 steps activity did not occur: (P) Safety/medical concerns (pain/fatigue/nausea)      Walk up/down 12 steps activity Walk up/down 12 steps activity did not occur: (P) Safety/medical concerns (pain/fatigue/nausea)      Pick up small objects from floor Pick up small object from the floor (from standing position) activity did not occur: (P) Safety/medical concerns Pick up small object from the floor  assist level: (P) Total Assistance - Patient < 25%    Wheelchair        supervision    Wheel 50 feet with 2 turns activity  supervision    Wheel 150 feet activity  supervision      Refer to Care Plan for Long Term Goals  SHORT TERM GOAL WEEK 1 PT Short Term Goal 1 (Week 1): STG=LTG 2/2 ELOS  Recommendations for other services: Therapeutic Recreation  Pet therapy and Stress management  Skilled Therapeutic Intervention Mobility Bed Mobility Bed Mobility: Rolling Left;Sit to Supine;Supine to Sit;Scooting to [facility] Rolling Left: Supervision/Verbal cueing Supine to Sit: Contact Guard/Touching assist Sit to Supine: Contact Guard/Touching assist Scooting to HOB: Contact Guard/Touching assist Transfers Transfers: Sit to Stand;Stand to Sit;Squat Pivot Transfers;Lateral/Scoot Transfers Sit to Stand: Minimal Assistance - Patient > 75% (elevated bed for management of R LE NWB) Stand to Sit: Minimal Assistance - Patient > 75% Stand Pivot Transfers: Moderate Assistance - Patient 50 - 74% Stand Pivot Transfer Details: Verbal cues for sequencing Squat Pivot Transfers: Moderate Assistance - Patient 50-74% (arm rest of BSC) Lateral/Scoot Transfers: Minimal  Assistance - Patient > 75% (slide board) Transfer (Assistive device): Rolling walker Locomotion  Gait Ambulation: No (fatigue/nausea) Gait Gait: No Stairs / Additional Locomotion Stairs: No Wheelchair Mobility Wheelchair Mobility: Yes Wheelchair Assistance: Doctor, general practice: Both upper extremities Wheelchair Parts Management: Needs assistance Distance: 150   Discharge Criteria: Patient will be discharged from PT if patient refuses treatment 3 consecutive times without medical reason, if treatment goals not met, if there is a change in medical status, if patient makes no progress towards goals or if patient is discharged from hospital.  The above assessment, treatment plan, treatment alternatives and goals were discussed and mutually agreed upon: by patient and by family  Today's Interventions  Evaluation completed (see details above and below) with education on PT POC and goals and individual treatment initiated with focus on safety awareness.   Upon arrival, pt attempting to do 180 deg squat pivot transfer bed to [facility] (with BSC positioned directly in front of her). PT discontinued this transfer. Education provided regarding safety concerns with pt set up as well as transferring oneself 2/2 nausea, fall risk, and poor adherence to R LE NWBing precautions. Pt admits to getting up twice last night despite instructions not to due to urgency. Therapist recommended pt use urinal or bed pan as needed with urgency. Pt eventually agreeable to this with extensive discussion regarding safety concerns.   Pt then performed squat pivot transfer bed to [facility] with mod A (with proper set up), max verbal and tactile cues provided for adherence to R LE NWBing precautions.  Pt continent of bladder.   Pt reports 2 falls in the last 6 months-one 6 weeks ago in resulting in concussion (pt reports she never sought medical care after this fall) , and the other resulting in this  hospital stay ; both of which occurred with stair navigation-one ascending, the other descending-both of which R LE buckled. Pt has handrail on stairs however pt was not using it 2/2 being in a rush. Pt husband present throughout sessinon. Pt husband and son are installing ramp for home entry. Recommended pt husband complete this as soon as possible.   Pt reports nausea with prolonged sitting EOB. Vitals assesed: supine BP 134/71 HR 92, notified nursing, nurse administered anti nausea medication.   PT provided pt with 18x16 inch wide WC with R LE elevating leg rest.  Donned pants while sitting EOB with total A for time/energy conservation. Pt attempted to donn with lateral trunk lean technique and CGA however unable to get enough clearance. Pt performed sit to stand with RW and min A from elevated hospital bed (for adherence to R LE NWBIng precautions.   Initiated [employer] transfer. Pt very appreciative of this method as pt expressed fear of using RW during previous therapies 2/2 intermittent dizziness/nausea. Pt performed side board transfer bed to [facility] with min A, mod verbal cues provided for maintenance of R LE NWB.   Discussed car - pt reports only car option is [employer], [employer]. Discussed transfer required to get into car is stand pivot transfer. Pt agreeable to trail next session.   Pt self propelled WC 150 feet with B UE and supervision.     [facility] [place], [place], DPT  06/16/2024, 12:29 PM

## 2024-06-16 NOTE — Progress Notes (Signed)
 Inpatient Rehabilitation Care Coordinator Assessment and Plan Patient Details  Name: Shirley Sullivan MRN: [id number] Date of Birth: 24-Mar-1961  Today's Date: 06/16/2024  Hospital Problems: Principal Problem:   Right tibial fracture  Past Medical History:  Past Medical History:  Diagnosis Date   Allergy    Anxiety    Arthritis    Asthma    perfumes triggers attacks. Has inhalers for rescue   COVID-19 10/09/2020   and 08/15/20   Depression    Diabetes mellitus without complication (HCC)    History of kidney stones 1998, 2015   Lupus    Septic shock (HCC)    Klebsiella oxytocin and Enterobacter cloacae bacteremia/UTI   Past Surgical History:  Past Surgical History:  Procedure Laterality Date   ABDOMINAL HYSTERECTOMY  2004   APPENDECTOMY  1992   CYSTOSCOPY WITH RETROGRADE PYELOGRAM, URETEROSCOPY AND STENT PLACEMENT Left 08/11/2014   Procedure: CYSTOSCOPY WITH RETROGRADE PYELOGRAM, URETEROSCOPY AND STENT PLACEMENT,  DIGITAL FLEXIBLE URETEROSCOPE;  Surgeon: Gretel Ferrara, MD;  Location: [facility];  Service: Urology;  Laterality: Left;  request digital flexible ureteroscope   CYSTOSCOPY WITH RETROGRADE PYELOGRAM, URETEROSCOPY AND STENT PLACEMENT Left 08/23/2014   Procedure: CYSTOSCOPY WITH RETROGRADE PYELOGRAM, URETEROSCOPY AND STENT EXCHANGE;  Surgeon: Gretel Ferrara, MD;  Location: [facility];  Service: Urology;  Laterality: Left;   CYSTOSCOPY/URETEROSCOPY/HOLMIUM LASER/STENT PLACEMENT Right 11/02/2020   Procedure: CYSTOSCOPY/URETEROSCOPY/HOLMIUM LASER/STENT PLACEMENT/ REMOVAL OF RIGHT NEPHROSTOMY TUBE;  Surgeon: Ferrara Gretel, MD;  Location: [facility];  Service: Urology;  Laterality: Right;  patient will not need to be covid tested, tested positive on 11/14/20 has proof   HOLMIUM LASER APPLICATION Left 08/23/2014   Procedure: HOLMIUM LASER APPLICATION;  Surgeon: Gretel Ferrara, MD;  Location: [facility];  Service: Urology;  Laterality: Left;   IR NEPHROSTOMY EXCHANGE RIGHT  10/17/2020   IR NEPHROSTOMY  PLACEMENT RIGHT  10/10/2020   ROTATOR CUFF REPAIR Right 2007   Social History:  reports that she has never smoked. She has never used smokeless tobacco. She reports that she does not drink alcohol and does not use drugs.  Family / Support Systems Marital Status: Married Patient Roles: Spouse, Parent, Other (Comment) (Retiree) Spouse/Significant Other: Shirley Sullivan [phone] Children: son local Other Supports: 44 yo fahter whom pt and husband asssit with-he lives in own home Anticipated Caregiver: Husband Ability/Limitations of Caregiver: Can assist in good health and retired Engineer, structural Availability: 24/7 Family Dynamics: Close with family and Dad, pt has fallen twice in past 8 weeks and it is on her two steps into home. Pt is hopeful she will do well and feels at leaset didnt cause a concussion like the first fall  Social History Preferred language: English Religion: Baptist Cultural Background: NA Education: HS Health Literacy - How often do you need to have someone help you when you read instructions, pamphlets, or other written material from your doctor or pharmacy?: Never Writes: Yes Employment Status: Retired Marine scientist Issues: na Guardian/Conservator: None-according to MD pt is capable of making her own decisions while here   Abuse/Neglect Abuse/Neglect Assessment Can Be Completed: Yes Physical Abuse: Denies Verbal Abuse: Denies Sexual Abuse: Denies Exploitation of patient/patient's resources: Denies Self-Neglect: Denies  Patient response to: Social Isolation - How often do you feel lonely or isolated from those around you?: Never  Emotional Status Pt's affect, behavior and adjustment status: Pt is motivated to do what she can for herself and her husband will assist until she is able to recover from this. Husband is here and supportive of pt.  Recent Psychosocial Issues: Just recovered from concussion from last fall 7 weeks ago Psychiatric History: Hx-depression  takes medications and finds them helpful. Seems to be optimistic regarding recovery and getting back home Substance Abuse History: NA  Patient / Family Perceptions, Expectations & Goals Pt/Family understanding of illness & functional limitations: Pt and husband can explain her fracture and WB issues, this is what makes it more difficult is she is NWB on her leg. Both talk with the MD involved and feel understand her treatment plan moving forward. Premorbid pt/family roles/activities: wife, mom, daughter, retiree, etc Anticipated changes in roles/activities/participation: resume Pt/family expectations/goals: Pt states:  I hope to do as well as I can i know it makes it hard with my weight bearing issues.  Husband states:  I will help her.  Community [employer] Agencies: None Premorbid Home Care/DME Agencies: Other (Comment) (cane) Transportation available at discharge: both Is the patient able to respond to transportation needs?: Yes In the past 12 months, has lack of transportation kept you from medical appointments or from getting medications?: No In the past 12 months, has lack of transportation kept you from meetings, work, or from getting things needed for daily living?: No  Discharge Planning Living Arrangements: Spouse/significant other Support Systems: Spouse/significant other, Children, Parent, Friends/neighbors Type of Residence: Private residence Insurance Resources: Electrical engineer Resources: Social Security, Family Support Financial Screen Referred: No Living Expenses: Own Money Management: Patient, Spouse Does the patient have any problems obtaining your medications?: No Home Management: both Patient/Family Preliminary Plans: Return home with husband who is retired and able to assist her if needed. Both assist her 34 yo father who is in fairly good health and still lives home alone. Aware being evaluated today and goals being set for stay here. Care  Coordinator Barriers to Discharge: Weight bearing restrictions Care Coordinator Anticipated Follow Up Needs: HH/OP  Clinical Impression Pleasant female who is motivated to do well and become as independent as possible with WB issues. Her husband is supportive and will assist at discharge. Will update tomorrow after team conference.  Shirley Sullivan 06/16/2024, 9:08 AM

## 2024-06-16 NOTE — Plan of Care (Signed)
  Problem: RH Balance Goal: LTG: Patient will maintain dynamic sitting balance (OT) Description: LTG:  Patient will maintain dynamic sitting balance with assistance during activities of daily living (OT) Flowsheets (Taken 06/16/2024 1328) LTG: Pt will maintain dynamic sitting balance during ADLs with: Independent with assistive device Goal: LTG Patient will maintain dynamic standing with ADLs (OT) Description: LTG:  Patient will maintain dynamic standing balance with assist during activities of daily living (OT)  Flowsheets (Taken 06/16/2024 1328) LTG: Pt will maintain dynamic standing balance during ADLs with: Supervision/Verbal cueing   Problem: Sit to Stand Goal: LTG:  Patient will perform sit to stand in prep for activites of daily living with assistance level (OT) Description: LTG:  Patient will perform sit to stand in prep for activites of daily living with assistance level (OT) Flowsheets (Taken 06/16/2024 1328) LTG: PT will perform sit to stand in prep for activites of daily living with assistance level: Supervision/Verbal cueing   Problem: RH Eating Goal: LTG Patient will perform eating w/assist, cues/equip (OT) Description: LTG: Patient will perform eating with assist, with/without cues using equipment (OT) Flowsheets (Taken 06/16/2024 1328) LTG: Pt will perform eating with assistance level of: Independent with assistive device    Problem: RH Grooming Goal: LTG Patient will perform grooming w/assist,cues/equip (OT) Description: LTG: Patient will perform grooming with assist, with/without cues using equipment (OT) Flowsheets (Taken 06/16/2024 1328) LTG: Pt will perform grooming with assistance level of: Independent with assistive device    Problem: RH Bathing Goal: LTG Patient will bathe all body parts with assist levels (OT) Description: LTG: Patient will bathe all body parts with assist levels (OT) Flowsheets (Taken 06/16/2024 1328) LTG: Pt will perform bathing with assistance  level/cueing: Supervision/Verbal cueing   Problem: RH Dressing Goal: LTG Patient will perform upper body dressing (OT) Description: LTG Patient will perform upper body dressing with assist, with/without cues (OT). Flowsheets (Taken 06/16/2024 1328) LTG: Pt will perform upper body dressing with assistance level of: Independent with assistive device Goal: LTG Patient will perform lower body dressing w/assist (OT) Description: LTG: Patient will perform lower body dressing with assist, with/without cues in positioning using equipment (OT) Flowsheets (Taken 06/16/2024 1328) LTG: Pt will perform lower body dressing with assistance level of: Supervision/Verbal cueing   Problem: RH Toileting Goal: LTG Patient will perform toileting task (3/3 steps) with assistance level (OT) Description: LTG: Patient will perform toileting task (3/3 steps) with assistance level (OT)  Flowsheets (Taken 06/16/2024 1328) LTG: Pt will perform toileting task (3/3 steps) with assistance level: Supervision/Verbal cueing   Problem: RH Toilet Transfers Goal: LTG Patient will perform toilet transfers w/assist (OT) Description: LTG: Patient will perform toilet transfers with assist, with/without cues using equipment (OT) Flowsheets (Taken 06/16/2024 1328) LTG: Pt will perform toilet transfers with assistance level of: Supervision/Verbal cueing   Problem: RH Tub/Shower Transfers Goal: LTG Patient will perform tub/shower transfers w/assist (OT) Description: LTG: Patient will perform tub/shower transfers with assist, with/without cues using equipment (OT) Flowsheets (Taken 06/16/2024 1328) LTG: Pt will perform tub/shower stall transfers with assistance level of: Supervision/Verbal cueing

## 2024-06-16 NOTE — Progress Notes (Signed)
 [facility] Individual Statement of Services  Patient Name:  Shirley Sullivan  Date:  06/16/2024  Welcome to the [facility].  Our goal is to provide you with an individualized program based on your diagnosis and situation, designed to meet your specific needs.  With this comprehensive rehabilitation program, you will be expected to participate in at least 3 hours of rehabilitation therapies Monday-Friday, with modified therapy programming on the weekends.  Your rehabilitation program will include the following services:  Physical Therapy (PT), Occupational Therapy (OT), 24 hour per day rehabilitation nursing, Therapeutic Recreaction (TR), Care Coordinator, Rehabilitation Medicine, Nutrition Services, and Pharmacy Services  Weekly team conferences will be held on Wednesday to discuss your progress.  Your Inpatient Rehabilitation Care Coordinator will talk with you frequently to get your input and to update you on team discussions.  Team conferences with you and your family in attendance may also be held.  Expected length of stay: 7-10 days  Overall anticipated outcome: supervision with transfers and CGA-ambulation  Depending on your progress and recovery, your program may change. Your Inpatient Rehabilitation Care Coordinator will coordinate services and will keep you informed of any changes. Your Inpatient Rehabilitation Care Coordinator's name and contact numbers are listed  below.  The following services may also be recommended but are not provided by the [facility]:  Driving Evaluations Home Health Rehabiltiation Services Outpatient Rehabilitation Services    Arrangements will be made to provide these services after discharge if needed.  Arrangements include referral to agencies that provide these services.  Your insurance has been verified to be:  Medicare & BCBS Your primary doctor is:  Marcele Hinojosa-Clapp  Pertinent  information will be shared with your doctor and your insurance company.  Inpatient Rehabilitation Care Coordinator:  Rhoda Clement, KEN [phone] or ELIGAH BASQUES  Information discussed with and copy given to patient by: Clement Asberry MATSU, 06/16/2024, 9:11 AM

## 2024-06-16 NOTE — Evaluation (Addendum)
 Occupational Therapy Assessment and Plan  Patient Details  Name: Shirley Sullivan MRN: [id number] Date of Birth: [birth year]  OT Diagnosis: acute pain, muscle weakness (generalized), pain in joint, and swelling of limb Rehab Potential: Rehab Potential (ACUTE ONLY): Good ELOS: 7-10   Session 1: Today's Date: 06/16/2024 OT Individual Time: 8896-8798 OT Individual Time Calculation (min): 58 min      Session 2: Today's Date: 06/16/2024 OT Individual Time: 8580-8467 OT Individual Time Calculation (min): 73 min     Hospital Problem: Principal Problem:   Right tibial fracture   Past Medical History:  Past Medical History:  Diagnosis Date   Allergy    Anxiety    Arthritis    Asthma    perfumes triggers attacks. Has inhalers for rescue   COVID-19 10/09/2020   and 08/15/20   Depression    Diabetes mellitus without complication (HCC)    History of kidney stones 1998, 2015   Lupus    Septic shock (HCC)    Klebsiella oxytocin and Enterobacter cloacae bacteremia/UTI   Past Surgical History:  Past Surgical History:  Procedure Laterality Date   ABDOMINAL HYSTERECTOMY  2004   APPENDECTOMY  1992   CYSTOSCOPY WITH RETROGRADE PYELOGRAM, URETEROSCOPY AND STENT PLACEMENT Left 08/11/2014   Procedure: CYSTOSCOPY WITH RETROGRADE PYELOGRAM, URETEROSCOPY AND STENT PLACEMENT,  DIGITAL FLEXIBLE URETEROSCOPE;  Surgeon: Gretel Ferrara, MD;  Location: [facility];  Service: Urology;  Laterality: Left;  request digital flexible ureteroscope   CYSTOSCOPY WITH RETROGRADE PYELOGRAM, URETEROSCOPY AND STENT PLACEMENT Left 08/23/2014   Procedure: CYSTOSCOPY WITH RETROGRADE PYELOGRAM, URETEROSCOPY AND STENT EXCHANGE;  Surgeon: Gretel Ferrara, MD;  Location: [facility];  Service: Urology;  Laterality: Left;   CYSTOSCOPY/URETEROSCOPY/HOLMIUM LASER/STENT PLACEMENT Right 11/02/2020   Procedure: CYSTOSCOPY/URETEROSCOPY/HOLMIUM LASER/STENT PLACEMENT/ REMOVAL OF RIGHT NEPHROSTOMY TUBE;  Surgeon: Ferrara Gretel, MD;  Location: [facility];   Service: Urology;  Laterality: Right;  patient will not need to be covid tested, tested positive on 11/14/20 has proof   HOLMIUM LASER APPLICATION Left 08/23/2014   Procedure: HOLMIUM LASER APPLICATION;  Surgeon: Gretel Ferrara, MD;  Location: [facility];  Service: Urology;  Laterality: Left;   IR NEPHROSTOMY EXCHANGE RIGHT  10/17/2020   IR NEPHROSTOMY PLACEMENT RIGHT  10/10/2020   ROTATOR CUFF REPAIR Right 2007    Assessment & Plan Clinical Impression: Patient is a 63 y.o.  right handed female with history of anxiety, asthma, depression, diabetes mellitus without complications, lupus and hypertension.  Per chart review patient lives with spouse and son.  1 level home 2 steps to entry.  Presented 06/12/2024 after mechanical fall landing on her right knee.  She had immediate pain.  X-rays showed a right.  Tibial plateau fracture.  Underwent ORIF of right proximal tibial fracture 06/12/2024 per Dr. Kendal.  Nonweightbearing right lower extremity.  She was cleared to begin Lovenox  for DVT prophylaxis transitioning to aspirin  325 mg daily x 4 weeks on discharge.  Acute blood loss anemia 10.5.  Therapy evaluations completed due to patient's decreased functional mobility was admitted for a comprehensive rehab program.    Patient currently requires mod with basic self-care skills secondary to muscle weakness and muscle joint tightness, decreased cardiorespiratoy endurance, and decreased sitting balance, decreased standing balance, difficulty maintaining precautions, and NWB precautions.  Prior to hospitalization, patient could complete ADLs with independent .  Patient will benefit from skilled intervention to decrease level of assist with basic self-care skills, increase independence with basic self-care skills, and increase level of independence with iADL prior to discharge home  with care partner.  Anticipate patient will require 24 hour supervision and follow up home health.  OT - End of Session Activity  Tolerance: Tolerates 30+ min activity with multiple rests Endurance Deficit: Yes OT Assessment Rehab Potential (ACUTE ONLY): Good OT Patient demonstrates impairments in the following area(s): Balance;Pain;Safety;Sensory;Endurance;Motor OT Basic ADL's Functional Problem(s): Grooming;Bathing;Dressing;Toileting OT Transfers Functional Problem(s): Tub/Shower;Toilet OT Plan OT Intensity: Minimum of 1-2 x/day, 45 to 90 minutes OT Frequency: 5 out of 7 days OT Duration/Estimated Length of Stay: 7-10 OT Treatment/Interventions: Warden/ranger;Wheelchair propulsion/positioning;UE/LE Strength taining/ROM;DME/adaptive equipment instruction;Community reintegration;Cognitive remediation/compensation;Disease mangement/prevention;Functional mobility training;Patient/family education;Visual/perceptual remediation/compensation;Therapeutic Exercise;Discharge planning;Pain management;Self Care/advanced ADL retraining;Therapeutic Activities;UE/LE Coordination activities OT Self Feeding Anticipated Outcome(s): mod I OT Basic Self-Care Anticipated Outcome(s): mod I OT Toileting Anticipated Outcome(s): SUP OT Bathroom Transfers Anticipated Outcome(s): SUP OT Recommendation Patient destination: Home Follow Up Recommendations: Home health OT Equipment Recommended: 3 in 1 bedside comode;Rolling walker with 5 wheels   OT Evaluation Precautions/Restrictions  Precautions Precautions: Fall Recall of Precautions/Restrictions: Intact Precaution/Restrictions Comments: pt aware of precautions however poor implementation and poor decision making. Restrictions Weight Bearing Restrictions Per Provider Order: Yes RLE Weight Bearing Per Provider Order: Non weight bearing Other Position/Activity Restrictions: NWB RLE General Chart Reviewed: Yes PT Missed Treatment Reason: Not applicable Response to Previous Treatment: Not applicable Vital Signs   Pain Pain Assessment Pain Scale: 0-10 Pain Score: 8   Pain Location: Knee Pain Orientation: Right Pain Descriptors / Indicators: Dull;Throbbing Home Living/Prior Functioning Home Living Living Arrangements: Spouse/significant other Available Help at Discharge: Family, Available 24 hours/day Type of Home: House Home Access: Stairs to enter [employer] of Steps: 2 Entrance Stairs-Rails: Right Home Layout: One level Bathroom Shower/Tub: Tub/shower unit (has transfer tub bench) Bathroom Toilet: Standard Bathroom Accessibility: Yes Additional Comments: 2 falls in the past 6 months, one resulting in this hospital stay, the other occured 6 weeks ago and resulted in a concussion. Both of which occured with stair navigation without UE support resulting in R LE buckling. Pt and son are planning to install ramp prior to D/C.  Lives With: Spouse, Son IADL History Homemaking Responsibilities: No Current License: Yes Mode of Transportation: Car Prior Function Level of Independence: Independent with transfers, Independent with basic ADLs, Needs assistance with gait  Able to Take Stairs?: Yes Driving: Yes Vocation: Retired Administrator, sports Baseline Vision/History: 1 Wears glasses;4 Cataracts Ability to See in Adequate Light: 0 Adequate Patient Visual Report: No change from baseline Vision Assessment?: No apparent visual deficits Perception  Perception: Within Functional Limits Praxis Praxis: WFL Cognition Cognition Overall Cognitive Status: Within Functional Limits for tasks assessed Arousal/Alertness: Awake/alert Memory: Impaired Memory Impairment: Decreased recall of new information Awareness: Impaired Problem Solving: Appears intact Behaviors: Impulsive Safety/Judgment: Impaired Comments: pt admits to getting OOB x2 on her own to [facility] despite instructions not to. Pt demonstrates poor adherence to R LE NWBing without max verbal cues. Pt initially attempting to perform 180 deg turn to [facility] upon PT entry. Brief Interview for Mental Status  (BIMS) Repetition of Three Words (First Attempt): 3 Temporal Orientation: Year: Correct Temporal Orientation: Month: Accurate within 5 days Temporal Orientation: Day: Correct Recall: Sock: Yes, no cue required Recall: Blue: Yes, no cue required Recall: Bed: Yes, no cue required BIMS Summary Score: 15 Sensation Sensation Light Touch: Impaired by gross assessment Additional Comments: B LE neuropathy at baseline, otherwise light touch in tact Coordination Gross Motor Movements are Fluid and Coordinated: No Fine Motor Movements are Fluid and Coordinated: Yes Coordination and Movement Description: R LE  NWBing Motor  Motor Motor: Other (comment) Motor - Skilled Clinical Observations: generalized debility  Trunk/Postural Assessment  Cervical Assessment Cervical Assessment: Exceptions to [facility] (forward head) Thoracic Assessment Thoracic Assessment: Exceptions to [facility] (sight kyphosis) Lumbar Assessment Lumbar Assessment: Within Functional Limits Postural Control Postural Control: Within Functional Limits  Balance Balance Balance Assessed: Yes Static Sitting Balance Static Sitting - Balance Support: Feet supported Static Sitting - Level of Assistance: 5: Stand by assistance Dynamic Sitting Balance Dynamic Sitting - Balance Support: Feet supported;During functional activity Dynamic Sitting - Level of Assistance: 5: Stand by assistance (CGA) Dynamic Sitting - Balance Activities: Lateral lean/weight shifting;Forward lean/weight shifting;Reaching for objects;Reaching across midline Sitting balance - Comments: CGA Static Standing Balance Static Standing - Balance Support: Right upper extremity supported;Left upper extremity supported Static Standing - Level of Assistance: 5: Stand by assistance Static Standing - Comment/# of Minutes: CGA Dynamic Standing Balance Dynamic Standing - Balance Support: During functional activity;Right upper extremity supported;Left upper extremity  supported Dynamic Standing - Level of Assistance: 3: Mod assist Dynamic Standing - Balance Activities: Lateral lean/weight shifting Dynamic Standing - Comments: squat pivot transfer Extremity/Trunk Assessment RUE Assessment RUE Assessment: Within Functional Limits Active Range of Motion (AROM) Comments: WFL General Strength Comments: 5/5 LUE Assessment LUE Assessment: Within Functional Limits Active Range of Motion (AROM) Comments: WFL General Strength Comments: 4/5  Care Tool Care Tool Self Care Eating   Eating Assist Level: Set up assist    Oral Care    Oral Care Assist Level: Set up assist    Bathing   Body parts bathed by patient: Right arm;Right lower leg;Left arm;Left upper leg;Chest;Left lower leg;Abdomen;Front perineal area;Buttocks;Right upper leg;Face   Body parts n/a: Buttocks;Right lower leg;Left lower leg Assist Level: Moderate Assistance - Patient 50 - 74%    Upper Body Dressing(including orthotics)   What is the patient wearing?: Pull over shirt   Assist Level: Set up assist    Lower Body Dressing (excluding footwear)   What is the patient wearing?: Pants;Underwear/pull up Assist for lower body dressing: Moderate Assistance - Patient 50 - 74%    Putting on/Taking off footwear   What is the patient wearing?: Non-skid slipper socks Assist for footwear: Contact Guard/Touching assist       Care Tool Toileting Toileting activity   Assist for toileting: Moderate Assistance - Patient 50 - 74%     Care Tool Bed Mobility Roll left and right activity   Roll left and right assist level: Supervision/Verbal cueing    Sit to lying activity   Sit to lying assist level: Contact Guard/Touching assist    Lying to sitting on side of bed activity   Lying to sitting on side of bed assist level: the ability to move from lying on the back to sitting on the side of the bed with no back support.: Contact Guard/Touching assist     Care Tool Transfers Sit to stand  transfer   Sit to stand assist level: Minimal Assistance - Patient > 75%    Chair/bed transfer   Chair/bed transfer assist level: Moderate Assistance - Patient 50 - 74%     Toilet transfer   Assist Level: Moderate Assistance - Patient 50 - 74%     Care Tool Cognition  Expression of Ideas and Wants Expression of Ideas and Wants: 4. Without difficulty (complex and basic) - expresses complex messages without difficulty and with speech that is clear and easy to understand  Understanding Verbal and Non-Verbal Content Understanding Verbal and Non-Verbal Content:  4. Understands (complex and basic) - clear comprehension without cues or repetitions   Memory/Recall Ability Memory/Recall Ability : Current season;Location of own room;That he or she is in a hospital/hospital unit   Refer to Care Plan for Long Term Goals  SHORT TERM GOAL WEEK 1 OT Short Term Goal 1 (Week 1): STG=LTG due to ELOs  Recommendations for other services: None    Skilled Therapeutic Intervention  Session 1: Evaluation completed (see details above) with education on OT POC and goals and individual treatment initiated with focus on functional mobility/transfers, ADL re-training,  generalized strengthening and endurance, dynamic standing balance/coordination, pt/family education and discharge planning. Pt education provided on therapy schedule and safety policy with use of chair alarm. Pt participated in goal setting. Pt participated in modified ADL task (See above for functional performance). Patient able to complete self care ADL tasks with min to mod assist with verbal cues for safety and precautions   Session 2: Patient agreeable to participate in OT session. Reports 5/10 pain level.   Patient participated in skilled OT session focusing on functional mobility, sit to stands, UE strengthening, functional transfer training, and precaution compliance. Patient completed wc push ups ot elicit increased ability to use UE and  one LE in stands. Patient required verbal, visual and tactile cues for weight bearing precautions and proper body mechanics for stand. Patient able to complete sit to stand x10 with RW to increase standing within precautions. Patient completed  functional transfers with CG to min A with minA for transfers utilizing verbal cues with NWB on appropriate extremity .   ADL ADL Eating: Independent Where Assessed-Eating: Wheelchair Grooming: Setup Where Assessed-Grooming: Sitting at sink Upper Body Bathing: Setup Where Assessed-Upper Body Bathing: Sitting at sink Lower Body Bathing: Moderate assistance Where Assessed-Lower Body Bathing: Wheelchair Upper Body Dressing: Setup Where Assessed-Upper Body Dressing: Wheelchair Lower Body Dressing: Moderate assistance Where Assessed-Lower Body Dressing: Edge of bed Toileting: Moderate assistance Where Assessed-Toileting: Teacher, adult education: Moderate assistance Toilet Transfer Method: Stand pivot Toilet Transfer Equipment: Raised toilet seat Film/video editor: Moderate assistance;Minimal cueing Film/video editor Method: Warden/ranger: Shower seat with back Mobility  Bed Mobility Bed Mobility: Rolling Left;Sit to Supine;Supine to Sit;Scooting to [facility] Rolling Left: Supervision/Verbal cueing Supine to Sit: Contact Guard/Touching assist Sit to Supine: Contact Guard/Touching assist Scooting to HOB: Contact Guard/Touching assist Transfers Sit to Stand: Minimal Assistance - Patient > 75% (elevated bed for management of R LE NWB) Stand to Sit: Minimal Assistance - Patient > 75%   Discharge Criteria: Patient will be discharged from OT if patient refuses treatment 3 consecutive times without medical reason, if treatment goals not met, if there is a change in medical status, if patient makes no progress towards goals or if patient is discharged from hospital.  The above assessment, treatment plan, treatment  alternatives and goals were discussed and mutually agreed upon: by patient and by family  D'mariea L Ceasar Decandia 06/16/2024, 12:29 PM

## 2024-06-16 NOTE — Progress Notes (Addendum)
 PROGRESS NOTE   Subjective/Complaints: Patient reports she had some nausea last night, resolved with Zofran .  Intermittent nausea something she has been dealing with chronically and using Zofran  for this at home helps.  Reports she had a small bowel movement yesterday.  ROS: Patient denies new vision changes, dizziness,  vomiting, diarrhea,  shortness of breath or chest pain, headache, or mood change. + nausea- improved    Objective:   VAS US  LOWER EXTREMITY VENOUS (DVT) Result Date: 06/15/2024  Lower Venous DVT Study Patient Name:  Shirley Sullivan  Date of Exam:   06/15/2024 Medical Rec #: [id number]      Accession #:    [id number] Date of Birth: 1961/05/02       Patient Gender: F Patient Age:   63 years Exam Location:  [facility] Procedure:      VAS US  LOWER EXTREMITY VENOUS (DVT) Referring Phys: TORIBIO PITCH --------------------------------------------------------------------------------  Indications: edema. Other Indications: Rehab patient. Risk Factors: Fall on 06/12/2024 with right tibial plateau fracture s/p ORIF of right proximal tibial fracture. Limitations: Poor ultrasound/tissue interface. Comparison Study: Previous exam on 10/15/2022 was negative for DVT Performing Technologist: Ezzie Potters RVT, RDMS  Examination Guidelines: A complete evaluation includes B-mode imaging, spectral Doppler, color Doppler, and power Doppler as needed of all accessible portions of each vessel. Bilateral testing is considered an integral part of a complete examination. Limited examinations for reoccurring indications may be performed as noted. The reflux portion of the exam is performed with the patient in reverse Trendelenburg.  +---------+---------------+---------+-----------+----------+--------------+ RIGHT    CompressibilityPhasicitySpontaneityPropertiesThrombus Aging  +---------+---------------+---------+-----------+----------+--------------+ CFV      Full           Yes      Yes                                 +---------+---------------+---------+-----------+----------+--------------+ SFJ      Full                                                        +---------+---------------+---------+-----------+----------+--------------+ FV Prox  Full           Yes      Yes                                 +---------+---------------+---------+-----------+----------+--------------+ FV Mid   Full           Yes      Yes                                 +---------+---------------+---------+-----------+----------+--------------+ FV DistalFull           Yes      Yes                                 +---------+---------------+---------+-----------+----------+--------------+  PFV      Full                                                        +---------+---------------+---------+-----------+----------+--------------+ POP      Full           Yes      Yes                                 +---------+---------------+---------+-----------+----------+--------------+ PTV      Full                                                        +---------+---------------+---------+-----------+----------+--------------+ PERO     Full                                                        +---------+---------------+---------+-----------+----------+--------------+   +---------+---------------+---------+-----------+----------+-------------------+ LEFT     CompressibilityPhasicitySpontaneityPropertiesThrombus Aging      +---------+---------------+---------+-----------+----------+-------------------+ CFV      Full           Yes      Yes                                      +---------+---------------+---------+-----------+----------+-------------------+ SFJ      Full                                                              +---------+---------------+---------+-----------+----------+-------------------+ FV Prox  Full           Yes      Yes                                      +---------+---------------+---------+-----------+----------+-------------------+ FV Mid   Full           Yes      Yes                                      +---------+---------------+---------+-----------+----------+-------------------+ FV DistalFull           Yes      Yes                                      +---------+---------------+---------+-----------+----------+-------------------+ PFV      Full                                                             +---------+---------------+---------+-----------+----------+-------------------+  POP      Full           Yes      Yes                                      +---------+---------------+---------+-----------+----------+-------------------+ PTV      Full                                                             +---------+---------------+---------+-----------+----------+-------------------+ PERO                                                  Not well visualized +---------+---------------+---------+-----------+----------+-------------------+     Summary: BILATERAL: - No evidence of deep vein thrombosis seen in the lower extremities, bilaterally. -No evidence of popliteal cyst, bilaterally.   *See table(s) above for measurements and observations. Electronically signed by Fonda Rim on 06/15/2024 at 6:45:06 PM.    Final    Recent Labs    06/15/24 0701 06/16/24 0510  WBC 3.9* 3.9*  HGB 10.5* 10.0*  HCT 31.4* 29.9*  PLT 129* 147*   Recent Labs    06/15/24 0701 06/16/24 0510  NA 138 139  K 3.5 3.6  CL 102 101  CO2 28 27  GLUCOSE 142* 144*  BUN 11 13  CREATININE 0.60 0.60  CALCIUM 8.7* 9.0    Intake/Output Summary (Last 24 hours) at 06/16/2024 1331 Last data filed at 06/16/2024 0903 Gross per 24 hour  Intake 720 ml  Output --  Net 720 ml         Physical Exam: Vital Signs Blood pressure 128/68, pulse 87, temperature 98.3 F (36.8 C), temperature source Oral, resp. rate 18, height 5' 4 (1.626 m), weight 84.1 kg, SpO2 100%.  General: No apparent distress, laying in bed HEENT: Head is normocephalic, atraumatic, sclera anicteric, oral mucosa pink and moist, Neck: Tenderness along lateral neck, palpable tender nodes R>L  Heart: Reg rate and rhythm. No murmurs rubs or gallops Chest: CTA bilaterally without wheezes, rales, or rhonchi; no distress Abdomen: Soft, non-tender, non-distended, bowel sounds positive. Extremities: No clubbing, cyanosis, or edema. Pulses are 2+ Psych: Pt's affect is appropriate. Pt is cooperative Skin: Lower extremity incisions CDI Neuro: Alert and oriented x 4, follows commands, no memory deficits noted, no speech or language deficits noted, cranial nerves II through XII grossly intact,   MMT: BUE 5/5. RLE 2/5 HF, KE d/t pain. 5/5 RADF/PF.  LLE 4 to 5/5 prox to distal. Sensory exam normal for light touch and pain in all 4 limbs.   No limb ataxia or cerebellar signs. No abnormal tone appreciated.    MSK:  Right knee with rom from 0-70 degrees. Right knee still swollen, bruised and tender.     Assessment/Plan: 1. Functional deficits which require 3+ hours per day of interdisciplinary therapy in a comprehensive inpatient rehab setting. Physiatrist is providing close team supervision and 24 hour management of active medical problems listed below. Physiatrist and rehab team continue to assess barriers to discharge/monitor patient progress toward functional and medical goals  Care Tool:  Bathing    Body parts bathed by patient: Right arm, Right lower leg, Left arm, Left upper leg, Chest, Left lower leg, Abdomen, Front perineal area, Buttocks, Right upper leg, Face     Body parts n/a: Buttocks, Right lower leg, Left lower leg   Bathing assist Assist Level: Moderate Assistance - Patient 50 - 74%      Upper Body Dressing/Undressing Upper body dressing   What is the patient wearing?: Pull over shirt    Upper body assist Assist Level: Set up assist    Lower Body Dressing/Undressing Lower body dressing      What is the patient wearing?: Pants, Underwear/pull up     Lower body assist Assist for lower body dressing: Moderate Assistance - Patient 50 - 74%     Toileting Toileting    Toileting assist Assist for toileting: Moderate Assistance - Patient 50 - 74%     Transfers Chair/bed transfer  Transfers assist     Chair/bed transfer assist level: Moderate Assistance - Patient 50 - 74%     Locomotion Ambulation   Ambulation assist   Ambulation activity did not occur: Safety/medical concerns (pain/fatigue/nausea)          Walk 10 feet activity   Assist  Walk 10 feet activity did not occur: Safety/medical concerns (pain/fatigue/nausea)        Walk 50 feet activity   Assist Walk 50 feet with 2 turns activity did not occur: Safety/medical concerns (pain/fatigue/nausea)         Walk 150 feet activity   Assist Walk 150 feet activity did not occur: Safety/medical concerns (pain/fatigue/nausea)         Walk 10 feet on uneven surface  activity   Assist Walk 10 feet on uneven surfaces activity did not occur: Safety/medical concerns (pain/fatigue/nausea)         Wheelchair     Assist        Wheelchair assist level: Supervision/Verbal cueing Max wheelchair distance: 150    Wheelchair 50 feet with 2 turns activity    Assist        Assist Level: Supervision/Verbal cueing   Wheelchair 150 feet activity     Assist      Assist Level: Supervision/Verbal cueing   Blood pressure 128/68, pulse 87, temperature 98.3 F (36.8 C), temperature source Oral, resp. rate 18, height 5' 4 (1.626 m), weight 84.1 kg, SpO2 100%.  Medical Problem List and Plan: 1. Functional deficits secondary to right proximal tibial fracture.   Status post ORIF 06/12/2024 per Dr. Kendal.               -TDWB RLE per Dr. Kendal op-note, NWB in later note.  Continue nonweightbearing for now and will check with Ortho              -ACE wrap for support of Right knee             -patient may  shower             -ELOS/Goals: 7-10 days, mod I goals  - Team conference tomorrow 2.  Antithrombotics: -DVT/anticoagulation:  Pharmaceutical: Lovenox  check vascular study.  Transition to aspirin  325 mg daily x 4 weeks outpatient on discharge             -antiplatelet therapy: N/A 3. Pain Management: Neurontin  400 mg 3 times daily, Robaxin  and oxycodone  as needed             -pain fairly well controlled 4. Mood/Behavior/Sleep: Zoloft   100 mg daily             -antipsychotic agents:  5. Neuropsych/cognition: This patient is capable of making decisions on her own behalf. 6. Skin/Wound Care: Routine skin checks 7. Fluids/Electrolytes/Nutrition: Routine in and outs with follow-up chemistries 8.  Acute blood loss anemia.  Follow-up CBC  -7/15 Hemoglobin a little lower at 10.0, continue to monitor trend 9.  Hypertension.  Cozaar  25 mg daily.  Monitor with increased mobility  - BP controlled continue to monitor    06/16/2024    1:41 PM 06/16/2024    5:00 AM 06/15/2024    8:05 PM  Vitals with BMI  Height  5' 4   Systolic 125 128 870  Diastolic 61 68 64  Pulse 100 87 103    10.  Diabetes mellitus with peripheral neuropathy.  Currently on SSI.  Patient on Glucophage 1000 mg twice daily prior to admission.  Resume as needed  - 7/15 CBGs fairly well controlled, continue to monitor trend  CBG (last 3)  Recent Labs    06/15/24 2034 06/16/24 0635 06/16/24 1121  GLUCAP 133* 142* 134*     11. Mildly elevated AST  -Continue to monitor     LOS: 1 days A FACE TO FACE EVALUATION WAS PERFORMED  Murray Collier 06/16/2024, 1:31 PM

## 2024-06-16 NOTE — Progress Notes (Signed)
 Inpatient Rehabilitation Admission Medication Review by a Pharmacist  A complete drug regimen review was completed for this patient to identify any potential clinically significant medication issues.  High Risk Drug Classes Is patient taking? Indication by Medication  Antipsychotic No   Anticoagulant Yes Lovenox  - VTE prophylaxis  Antibiotic No   Opioid Yes Oxycodone  - pain  Antiplatelet No   Hypoglycemics/insulin  Yes Insulin  aspart SSI -DM  Vasoactive Medication Yes Losartan  - HTN  Chemotherapy No   Other Yes Acetaminophen  - pain Clotrimazole  cream- antifungal Gabapentin  - neuropathy Loratadine - allergies Methocarbamol - muscle spasms Miralax , docusate - constipation Ondansetron - N/V Sertraline  - depression Vitamin D - supplement     Type of Medication Issue Identified Description of Issue Recommendation(s)  Drug Interaction(s) (clinically significant)     Duplicate Therapy     Allergy     No Medication Administration End Date     Incorrect Dose     Additional Drug Therapy Needed     Significant med changes from prior encounter (inform family/care partners about these prior to discharge). PTA medications: Alprazolam , Aspirin , Bentyl, metformin not resumed on CIR admission.   Fosamax -hold inpatient per [facility] P&[facility]  policy.    Loratadine  substituted for Cetirizine Restart PTA meds when and if necessary during CIR admission or at time of discharge, if warranted  Hold Fosamax inpatient per [facility] pharmacy and therapeutic committee policy.  May resume Cetirizine on discharge.  Communicate any medication changes to patient /family/ caregiver prior to discharge and per instructions on the discharge AVS.    Other       Clinically significant medication issues were identified that warrant physician communication and completion of prescribed/recommended actions by midnight of the next day:  No  Name of provider notified for urgent issues identified:   Provider Method of  Notification:    Pharmacist comments:   Time spent performing this drug regimen review (minutes):  25   Levorn Gaskins, RPh Clinical Pharmacist 06/16/2024 8:24 AM

## 2024-06-16 NOTE — Discharge Summary (Signed)
 Physician Discharge Summary  Shirley Sullivan FMW:[id number] DOB: 1961-10-21 DOA: 06/11/2024  PCP: Shirley Presto, MD  Admit date: 06/11/2024 Discharge date: 06/16/2024  Time spent: 40 minutes  Recommendations for Outpatient Follow-up:  Follow outpatient CBC/CMP  Follow with orthopedics outpatient as recommended Needs vit D supplementation   Discharge Diagnoses:  Principal Problem:   Closed fracture of right tibial plateau Active Problems:   Closed fracture of shaft of right fibula   Diabetes mellitus without complication (HCC)   Hypertension   Class 1 obesity   Fall  Discharge Condition: stable  Diet recommendation: heart healthy, diabetic  Filed Weights   06/12/24 1337  Weight: 83.9 kg    History of present illness:   Shirley Sullivan is Shirley Sullivan 63 y.o. female with medical history significant for hypertension, type 2 diabetes mellitus, nephrolithiasis, lupus in remission, depression, and anxiety who presents with right knee pain and swelling after Shirley Sullivan fall.    Admitted for R tib/fib fracture, now s/p ORIF by orthopedics.   Transferring to CIR.    See below for additional details  Hospital Course:  Assessment and Plan:  1. Right tibia and fibula fractures  - CT showed comminuted fractures of the proximal tibial metaphysis with extension to the medial tibial plateau, comminuted transverse fractures of the proximal fibular shaft - s/p ORIF R proximal tibia fracture - post op care by ortho trauma, touchdown weightbearing to RLE, lovenox  for DVT ppx (aspirin  on discharge), PT/OT - she's interested in CIR - pain management, bowel regimen   2. Hypertension  - Continue losartan      3. Type II DM  Peripheral Neuropathy - Check CBGs and use low-intensity SSI for now   - A1c 5.8 - resume home metformin - gabapentin     # Osteoporosis - taking alendronate weekly   # Vitamin D  Deficiency - replace, follow outpatient  # Depression - zoloft       Procedures: 7/12  Open reduction internal fixation of right proximal tibia fracture    Consultations: orthopedics  Discharge Exam: Vitals:   06/15/24 0430 06/15/24 0732  BP: (!) 144/76 (!) 156/75  Pulse: 93 96  Resp: 16 16  Temp: 98.1 F (36.7 C) 98.4 F (36.9 C)  SpO2: 97% 100%   No new complaints Seen in CIR as patient was transferred before I discharged  General: No acute distress. Cardiovascular: RRR Lungs: unlabored Neurological: Alert and oriented 3. Moves all extremities 4. Cranial nerves II through XII grossly intact. Extremities: RLE splint  Discharge Instructions   I'm not able to do med rec as this patient was transferred to CIR before I discharged them  Allergies as of 06/15/2024       Reactions   Codeine Itching   Strawberry Extract Hives, Itching, Other (See Comments)   Can't breathe   Vibramycin [doxycycline Calcium] Nausea And Vomiting        Medication List     TAKE these medications    aspirin  EC 325 MG tablet Take 1 tablet (325 mg total) by mouth daily.   gabapentin  400 MG capsule Commonly known as: NEURONTIN  Gabapentin  400 mg three times per day, can take an extra 400 mg at bedtime if needed.   methocarbamol  500 MG tablet Commonly known as: ROBAXIN  Take 1 tablet (500 mg total) by mouth every 6 (six) hours as needed.   oxyCODONE  5 MG immediate release tablet Commonly known as: Roxicodone  Take 1 tablet (5 mg total) by mouth every 4 (four) hours as needed for severe pain (  pain score 7-10).       ASK your doctor about these medications    alendronate 70 MG tablet Commonly known as: FOSAMAX Take 70 mg by mouth once Shirley Sullivan week.   ALPRAZolam  0.5 MG tablet Commonly known as: XANAX  Take 0.5 tablets (0.25 mg total) by mouth 2 (two) times daily as needed for anxiety.   cetirizine 10 MG tablet Commonly known as: ZYRTEC Take 10 mg by mouth at bedtime.   dicyclomine 10 MG capsule Commonly known as: BENTYL Take 10 mg by mouth 4 (four) times daily as  needed (abdominal spasm.).   losartan  25 MG tablet Commonly known as: COZAAR  Take 25 mg by mouth daily.   metFORMIN 1000 MG tablet Commonly known as: GLUCOPHAGE Take 1,000 mg by mouth 2 (two) times daily with Shirley Sullivan meal.   mupirocin ointment 2 % Commonly known as: BACTROBAN Apply 1 application topically 2 (two) times daily as needed (wound care (hidradenitis suppurativa)).   ondansetron  4 MG disintegrating tablet Commonly known as: Zofran  ODT 4mg  ODT q4 hours prn nausea/vomit   sertraline  100 MG tablet Commonly known as: ZOLOFT  Take 100 mg by mouth at bedtime.       Allergies  Allergen Reactions   Codeine Itching   Strawberry Extract Hives, Itching and Other (See Comments)    Can't breathe   Vibramycin [Doxycycline Calcium] Nausea And Vomiting    Follow-up Information     Haddix, Franky SQUIBB, MD. Schedule an appointment as soon as possible for Shirley Sullivan visit in 2 week(s).   Specialty: Orthopedic Surgery Why: for wound check and repeat x-rays Contact information: [address] [place] [address] [phone]                  The results of significant diagnostics from this hospitalization (including imaging, microbiology, ancillary and laboratory) are listed below for reference.    Significant Diagnostic Studies: VAS US  LOWER EXTREMITY VENOUS (DVT) Result Date: 06/15/2024  Lower Venous DVT Study Patient Name:  Shirley Sullivan  Date of Exam:   06/15/2024 Medical Rec #: [id number]      Accession #:    [id number] Date of Birth: 12/12/1960       Patient Gender: F Patient Age:   55 years Exam Location:  [facility] Procedure:      VAS US  LOWER EXTREMITY VENOUS (DVT) Referring Phys: Shirley Sullivan --------------------------------------------------------------------------------  Indications: edema. Other Indications: Rehab patient. Risk Factors: Fall on 06/12/2024 with right tibial plateau fracture s/p ORIF of right proximal tibial fracture. Limitations: Poor  ultrasound/tissue interface. Comparison Study: Previous exam on 10/15/2022 was negative for DVT Performing Technologist: Shirley Sullivan RVT, RDMS  Examination Guidelines: Shirley Sullivan complete evaluation includes B-mode imaging, spectral Doppler, color Doppler, and power Doppler as needed of all accessible portions of each vessel. Bilateral testing is considered an integral part of Lyndsy Sullivan complete examination. Limited examinations for reoccurring indications may be performed as noted. The reflux portion of the exam is performed with the patient in reverse Trendelenburg.  +---------+---------------+---------+-----------+----------+--------------+ RIGHT    CompressibilityPhasicitySpontaneityPropertiesThrombus Aging +---------+---------------+---------+-----------+----------+--------------+ CFV      Full           Yes      Yes                                 +---------+---------------+---------+-----------+----------+--------------+ SFJ      Full                                                        +---------+---------------+---------+-----------+----------+--------------+  FV Prox  Full           Yes      Yes                                 +---------+---------------+---------+-----------+----------+--------------+ FV Mid   Full           Yes      Yes                                 +---------+---------------+---------+-----------+----------+--------------+ FV DistalFull           Yes      Yes                                 +---------+---------------+---------+-----------+----------+--------------+ PFV      Full                                                        +---------+---------------+---------+-----------+----------+--------------+ POP      Full           Yes      Yes                                 +---------+---------------+---------+-----------+----------+--------------+ PTV      Full                                                         +---------+---------------+---------+-----------+----------+--------------+ PERO     Full                                                        +---------+---------------+---------+-----------+----------+--------------+   +---------+---------------+---------+-----------+----------+-------------------+ LEFT     CompressibilityPhasicitySpontaneityPropertiesThrombus Aging      +---------+---------------+---------+-----------+----------+-------------------+ CFV      Full           Yes      Yes                                      +---------+---------------+---------+-----------+----------+-------------------+ SFJ      Full                                                             +---------+---------------+---------+-----------+----------+-------------------+ FV Prox  Full           Yes      Yes                                      +---------+---------------+---------+-----------+----------+-------------------+  FV Mid   Full           Yes      Yes                                      +---------+---------------+---------+-----------+----------+-------------------+ FV DistalFull           Yes      Yes                                      +---------+---------------+---------+-----------+----------+-------------------+ PFV      Full                                                             +---------+---------------+---------+-----------+----------+-------------------+ POP      Full           Yes      Yes                                      +---------+---------------+---------+-----------+----------+-------------------+ PTV      Full                                                             +---------+---------------+---------+-----------+----------+-------------------+ PERO                                                  Not well visualized +---------+---------------+---------+-----------+----------+-------------------+     Summary:  BILATERAL: - No evidence of deep vein thrombosis seen in the lower extremities, bilaterally. -No evidence of popliteal cyst, bilaterally.   *See table(s) above for measurements and observations. Electronically signed by Fonda Rim on 06/15/2024 at 6:45:06 PM.    Final    DG Knee Right Port Result Date: 06/12/2024 CLINICAL DATA:  Fracture, postop. EXAM: PORTABLE RIGHT KNEE - 1-2 VIEW COMPARISON:  Preoperative imaging FINDINGS: Lateral plate and screw fixation of proximal tibial fracture. Unchanged fracture alignment. Proximal fibular fracture is in unchanged alignment. Recent postsurgical change includes air and edema in the soft tissues. IMPRESSION: ORIF of proximal tibial fracture without immediate postoperative complication. Electronically Signed   By: Andrea Gasman M.D.   On: 06/12/2024 18:06   DG Tibia/Fibula Right Result Date: 06/12/2024 CLINICAL DATA:  Elective surgery. EXAM: RIGHT TIBIA AND FIBULA - 2 VIEW COMPARISON:  Preoperative imaging FINDINGS: Five fluoroscopic spot views of the right proximal tibia and fibula submitted from the operating room. Lateral plate and screw fixation of proximal tibial fracture. Again seen proximal fibular fracture. Fluoroscopy time 56.9 seconds. Dose 2.39 mGy. IMPRESSION: Intraoperative fluoroscopy during proximal tibial fracture fixation. Electronically Signed   By: Andrea Gasman M.D.   On: 06/12/2024 18:05   DG C-Arm 1-60 Min-No Report Result Date: 06/12/2024 Fluoroscopy was utilized by the requesting  physician.  No radiographic interpretation.   CT Knee Right Wo Contrast Result Date: 06/12/2024 CLINICAL DATA:  Knee trauma with dislocation suspected. Fracture seen on recent radiographs. EXAM: CT OF THE RIGHT KNEE WITHOUT CONTRAST TECHNIQUE: Multidetector CT imaging of the right knee was performed according to the standard protocol. Multiplanar CT image reconstructions were also generated. RADIATION DOSE REDUCTION: This exam was performed according to the  departmental dose-optimization program which includes automated exposure control, adjustment of the mA and/or kV according to patient size and/or use of iterative reconstruction technique. COMPARISON:  Right knee radiograph 06/12/2024 FINDINGS: Bones/Joint/Cartilage Comminuted fractures of the proximal tibial metaphysis with fracture lines extending to the medial tibial plateau. Transverse comminuted fractures of the proximal fibular neck without significant displacement. Small right knee effusion. Underlying degenerative changes in the right knee with medial compartment narrowing and small osteophyte formation. Chronic osteochondral defects in the medial tibial plateau. Old osteochondral defect in the anterior medial femoral condyle with loose body in-situ. No dislocation. Ligaments Suboptimally assessed by CT. Muscles and Tendons No intramuscular mass or hematoma. Soft tissues Soft tissue infiltration over the anterior and medial infrapatellar region consistent with hematoma and contusion. IMPRESSION: 1. Comminuted fractures of the proximal tibial metaphysis with extension to the medial tibial plateau. 2. Comminuted transverse fractures of the proximal fibular shaft. 3. Mild effusion. 4. Infiltration in the anterior soft tissues consistent with contusion and hematoma. 5. Underlying degenerative changes in the medial compartment. Electronically Signed   By: Elsie Gravely M.D.   On: 06/12/2024 02:27   DG Knee Complete 4 Views Right Result Date: 06/12/2024 CLINICAL DATA:  Pain after Ayanni Tun fall. EXAM: RIGHT KNEE - COMPLETE 4+ VIEW COMPARISON:  None Available. FINDINGS: Depressed intra-articular fracture of medial tibial plateau with fracture lines extending anterior to posterior through the metaphysis. Transverse comminuted fracture of the proximal fibular shaft. Soft tissue swelling in the upper aspect of the lower leg. No significant effusion. No dislocation at the knee joint. IMPRESSION: Comminuted and depressed  intra-articular fracture of the medial tibial plateau. Mildly comminuted transverse fracture of the proximal fibular shaft. Electronically Signed   By: Elsie Gravely M.D.   On: 06/12/2024 00:33   DG Hip Unilat W or Wo Pelvis 2-3 Views Right Result Date: 06/12/2024 CLINICAL DATA:  Pain after Shirley Sullivan fall EXAM: DG HIP (WITH OR WITHOUT PELVIS) 2-3V RIGHT COMPARISON:  None Available. FINDINGS: Degenerative changes in the lower lumbar spine and in both hips. Pelvis and right hip appear intact. No acute fracture or dislocation. No focal bone lesion or bone destruction. Soft tissues are unremarkable. Tubing in the lower abdomen likely representing reservoir for gastric lap band. IMPRESSION: Degenerative changes in the right hip. No acute displaced fractures are identified. Electronically Signed   By: Elsie Gravely M.D.   On: 06/12/2024 00:32    Microbiology: Recent Results (from the past 240 hours)  Surgical pcr screen     Status: None   Collection Time: 06/12/24  1:47 PM   Specimen: Nasal Mucosa; Nasal Swab  Result Value Ref Range Status   MRSA, PCR NEGATIVE NEGATIVE Final   Staphylococcus aureus NEGATIVE NEGATIVE Final    Comment: (NOTE) The Xpert SA Assay (FDA approved for NASAL specimens in patients 88 years of age and older), is one component of Shirley Sullivan comprehensive surveillance program. It is not intended to diagnose infection nor to guide or monitor treatment. Performed at [facility], [address] [place], [place] [address]      Labs: Basic Metabolic  Panel: Recent Labs  Lab 06/12/24 0601 06/12/24 1937 06/13/24 0727 06/14/24 0859 06/15/24 0701 06/16/24 0510  NA 136  --  137 137 138 139  K 3.9  --  3.8 3.6 3.5 3.6  CL 104  --  104 102 102 101  CO2 25  --  25 27 28 27   GLUCOSE 138*  --  131* 149* 142* 144*  BUN 23  --  10 10 11 13   CREATININE 0.78 0.75 0.79 0.62 0.60 0.60  CALCIUM 9.1  --  8.7* 8.9 8.7* 9.0   Liver Function Tests: Recent Labs  Lab 06/16/24 0510  AST  42*  ALT 22  ALKPHOS 43  BILITOT 0.7  PROT 6.4*  ALBUMIN 2.6*   No results for input(s): LIPASE, AMYLASE in the last 168 hours. No results for input(s): AMMONIA in the last 168 hours. CBC: Recent Labs  Lab 06/12/24 0215 06/12/24 0227 06/12/24 1937 06/13/24 0727 06/14/24 0859 06/15/24 0701 06/16/24 0510  WBC 7.6   < > 7.3 6.4 4.7 3.9* 3.9*  NEUTROABS 4.7  --   --   --   --   --  2.1  HGB 13.6   < > 12.4 11.6* 10.9* 10.5* 10.0*  HCT 40.8   < > 37.6 35.1* 32.9* 31.4* 29.9*  MCV 85.7   < > 87.2 87.3 88.4 88.2 87.9  PLT 186   < > 148* 159 139* 129* 147*   < > = values in this interval not displayed.   Cardiac Enzymes: No results for input(s): CKTOTAL, CKMB, CKMBINDEX, TROPONINI in the last 168 hours. BNP: BNP (last 3 results) No results for input(s): BNP in the last 8760 hours.  ProBNP (last 3 results) No results for input(s): PROBNP in the last 8760 hours.  CBG: Recent Labs  Lab 06/15/24 0755 06/15/24 1213 06/15/24 1648 06/15/24 2034 06/16/24 0635  GLUCAP 125* 143* 99 133* 142*       Signed:  Meliton Monte MD.  [facility] 06/16/2024, 9:33 AM

## 2024-06-16 NOTE — Progress Notes (Signed)
 Spoke with physician assistant Army Daring in regards to any range of motion restrictions to right ankle.  As of right now she notes no restrictions that she was aware of although they are going to discuss this with Dr. Kendal for clarification we will update with any further changes.

## 2024-06-16 NOTE — Plan of Care (Signed)
  Problem: RH Car Transfers Goal: LTG Patient will perform car transfers with assist (PT) Description: LTG: Patient will perform car transfers with assistance (PT). Flowsheets (Taken 06/16/2024 1548) LTG: Pt will perform car transfers with assist:: Minimal Assistance - Patient > 75%

## 2024-06-16 NOTE — Plan of Care (Signed)
  Problem: RH Balance Goal: LTG Patient will maintain dynamic sitting balance (PT) Description: LTG:  Patient will maintain dynamic sitting balance with assistance during mobility activities (PT) Flowsheets (Taken 06/16/2024 1258) LTG: Pt will maintain dynamic sitting balance during mobility activities with:: Supervision/Verbal cueing   Problem: Sit to Stand Goal: LTG:  Patient will perform sit to stand with assistance level (PT) Description: LTG:  Patient will perform sit to stand with assistance level (PT) Flowsheets (Taken 06/16/2024 1258) LTG: PT will perform sit to stand in preparation for functional mobility with assistance level: Supervision/Verbal cueing   Problem: RH Bed Mobility Goal: LTG Patient will perform bed mobility with assist (PT) Description: LTG: Patient will perform bed mobility with assistance, with/without cues (PT). Flowsheets (Taken 06/16/2024 1258) LTG: Pt will perform bed mobility with assistance level of: Independent with assistive device    Problem: RH Bed to Chair Transfers Goal: LTG Patient will perform bed/chair transfers w/assist (PT) Description: LTG: Patient will perform bed to chair transfers with assistance (PT). Flowsheets (Taken 06/16/2024 1258) LTG: Pt will perform Bed to Chair Transfers with assistance level: Supervision/Verbal cueing   Problem: RH Ambulation Goal: LTG Patient will ambulate in controlled environment (PT) Description: LTG: Patient will ambulate in a controlled environment, # of feet with assistance (PT). Flowsheets (Taken 06/16/2024 1258) LTG: Pt will ambulate in controlled environ  assist needed:: Contact Guard/Touching assist LTG: Ambulation distance in controlled environment: 10 feet iwth LRAD

## 2024-06-17 DIAGNOSIS — S82101S Unspecified fracture of upper end of right tibia, sequela: Secondary | ICD-10-CM

## 2024-06-17 DIAGNOSIS — F418 Other specified anxiety disorders: Secondary | ICD-10-CM

## 2024-06-17 DIAGNOSIS — E1142 Type 2 diabetes mellitus with diabetic polyneuropathy: Secondary | ICD-10-CM | POA: Diagnosis not present

## 2024-06-17 DIAGNOSIS — I951 Orthostatic hypotension: Secondary | ICD-10-CM

## 2024-06-17 DIAGNOSIS — I10 Essential (primary) hypertension: Secondary | ICD-10-CM | POA: Diagnosis not present

## 2024-06-17 DIAGNOSIS — S82101D Unspecified fracture of upper end of right tibia, subsequent encounter for closed fracture with routine healing: Secondary | ICD-10-CM | POA: Diagnosis not present

## 2024-06-17 LAB — GLUCOSE, CAPILLARY
Glucose-Capillary: 133 mg/dL — ABNORMAL HIGH (ref 70–99)
Glucose-Capillary: 129 mg/dL — ABNORMAL HIGH (ref 70–99)
Glucose-Capillary: 133 mg/dL — ABNORMAL HIGH (ref 70–99)
Glucose-Capillary: 130 mg/dL — ABNORMAL HIGH (ref 70–99)

## 2024-06-17 MED ORDER — ALPRAZOLAM 0.5 MG PO TABS: 0.5000 mg | ORAL_TABLET | Freq: Two times a day (BID) | ORAL | Status: DC | PRN

## 2024-06-17 MED ORDER — LOSARTAN POTASSIUM 25 MG PO TABS
12.5000 mg | ORAL_TABLET | Freq: Every day | ORAL | Status: DC
  Administered 2024-06-18 – 2024-06-26 (×9): 12.5 mg via ORAL
  Filled 2024-06-17 (×9): qty 1

## 2024-06-17 NOTE — Progress Notes (Addendum)
 PROGRESS NOTE   Subjective/Complaints: Patient reports she had some dizziness with therapy this morning.  She has been trying to drink more fluids since this time.  BP decreased only slightly with standing during orthostatic vital signs.  Patient requests regular diet.  Reports  had a small bowel movement yesterday.  ROS: Patient denies new vision changes,  vomiting, diarrhea,  shortness of breath or chest pain, headache, or mood change. + nausea- improved + Dizziness    Objective:   VAS US  LOWER EXTREMITY VENOUS (DVT) Result Date: 06/15/2024  Lower Venous DVT Study Patient Name:  Shirley Sullivan  Date of Exam:   06/15/2024 Medical Rec #: [id number]      Accession #:    [id number] Date of Birth: [birth year]       Patient Gender: F Patient Age:   63 years Exam Location:  [facility] Procedure:      VAS US  LOWER EXTREMITY VENOUS (DVT) Referring Phys: TORIBIO PITCH --------------------------------------------------------------------------------  Indications: edema. Other Indications: Rehab patient. Risk Factors: Fall on 06/12/2024 with right tibial plateau fracture s/p ORIF of right proximal tibial fracture. Limitations: Poor ultrasound/tissue interface. Comparison Study: Previous exam on 10/15/2022 was negative for DVT Performing Technologist: Ezzie Potters RVT, RDMS  Examination Guidelines: A complete evaluation includes B-mode imaging, spectral Doppler, color Doppler, and power Doppler as needed of all accessible portions of each vessel. Bilateral testing is considered an integral part of a complete examination. Limited examinations for reoccurring indications may be performed as noted. The reflux portion of the exam is performed with the patient in reverse Trendelenburg.  +---------+---------------+---------+-----------+----------+--------------+ RIGHT    CompressibilityPhasicitySpontaneityPropertiesThrombus Aging  +---------+---------------+---------+-----------+----------+--------------+ CFV      Full           Yes      Yes                                 +---------+---------------+---------+-----------+----------+--------------+ SFJ      Full                                                        +---------+---------------+---------+-----------+----------+--------------+ FV Prox  Full           Yes      Yes                                 +---------+---------------+---------+-----------+----------+--------------+ FV Mid   Full           Yes      Yes                                 +---------+---------------+---------+-----------+----------+--------------+ FV DistalFull           Yes      Yes                                 +---------+---------------+---------+-----------+----------+--------------+  PFV      Full                                                        +---------+---------------+---------+-----------+----------+--------------+ POP      Full           Yes      Yes                                 +---------+---------------+---------+-----------+----------+--------------+ PTV      Full                                                        +---------+---------------+---------+-----------+----------+--------------+ PERO     Full                                                        +---------+---------------+---------+-----------+----------+--------------+   +---------+---------------+---------+-----------+----------+-------------------+ LEFT     CompressibilityPhasicitySpontaneityPropertiesThrombus Aging      +---------+---------------+---------+-----------+----------+-------------------+ CFV      Full           Yes      Yes                                      +---------+---------------+---------+-----------+----------+-------------------+ SFJ      Full                                                              +---------+---------------+---------+-----------+----------+-------------------+ FV Prox  Full           Yes      Yes                                      +---------+---------------+---------+-----------+----------+-------------------+ FV Mid   Full           Yes      Yes                                      +---------+---------------+---------+-----------+----------+-------------------+ FV DistalFull           Yes      Yes                                      +---------+---------------+---------+-----------+----------+-------------------+ PFV      Full                                                             +---------+---------------+---------+-----------+----------+-------------------+  POP      Full           Yes      Yes                                      +---------+---------------+---------+-----------+----------+-------------------+ PTV      Full                                                             +---------+---------------+---------+-----------+----------+-------------------+ PERO                                                  Not well visualized +---------+---------------+---------+-----------+----------+-------------------+     Summary: BILATERAL: - No evidence of deep vein thrombosis seen in the lower extremities, bilaterally. -No evidence of popliteal cyst, bilaterally.   *See table(s) above for measurements and observations. Electronically signed by Fonda Rim on 06/15/2024 at 6:45:06 PM.    Final    Recent Labs    06/15/24 0701 06/16/24 0510  WBC 3.9* 3.9*  HGB 10.5* 10.0*  HCT 31.4* 29.9*  PLT 129* 147*   Recent Labs    06/15/24 0701 06/16/24 0510  NA 138 139  K 3.5 3.6  CL 102 101  CO2 28 27  GLUCOSE 142* 144*  BUN 11 13  CREATININE 0.60 0.60  CALCIUM 8.7* 9.0    Intake/Output Summary (Last 24 hours) at 06/17/2024 1028 Last data filed at 06/17/2024 0815 Gross per 24 hour  Intake 1680 ml  Output --  Net 1680  ml        Physical Exam: Vital Signs Blood pressure (!) 142/73, pulse 89, temperature 98.2 F (36.8 C), temperature source Oral, resp. rate 18, height 5' 4 (1.626 m), weight 84.1 kg, SpO2 99%.  General: No apparent distress, sitting in wheelchair working with therapy in the gym HEENT: Head is normocephalic, atraumatic, sclera anicteric, oral mucosa little dry Neck: Tenderness along lateral neck, palpable tender nodes R>L  Heart: Reg rate and rhythm. No murmurs rubs or gallops Chest: CTA bilaterally without wheezes, rales, or rhonchi; no distress Abdomen: Soft, non-tender, non-distended, bowel sounds positive. Extremities: No clubbing, cyanosis, or edema. Pulses are 2+ Psych: Pt's affect is appropriate. Pt is cooperative Skin: Lower extremity incisions CDI Neuro: Alert and oriented x 4, follows commands, no memory deficits noted, no speech or language deficits noted, cranial nerves II through XII grossly intact,   MMT: BUE 5/5. RLE 2/5 HF, KE d/t pain. 5/5 RADF/PF.  LLE 4 to 5/5 prox to distal. Sensory exam normal for light touch and pain in all 4 limbs.   No limb ataxia or cerebellar signs. No abnormal tone appreciated.    MSK:  Right knee with rom from 0-70 degrees. Right knee still swollen, bruised and tender.     Assessment/Plan: 1. Functional deficits which require 3+ hours per day of interdisciplinary therapy in a comprehensive inpatient rehab setting. Physiatrist is providing close team supervision and 24 hour management of active medical problems listed below. Physiatrist and rehab team continue to assess barriers to discharge/monitor patient progress toward functional  and medical goals  Care Tool:  Bathing    Body parts bathed by patient: Right arm, Right lower leg, Left arm, Left upper leg, Chest, Left lower leg, Abdomen, Front perineal area, Buttocks, Right upper leg, Face     Body parts n/a: Buttocks, Right lower leg, Left lower leg   Bathing assist Assist Level:  Moderate Assistance - Patient 50 - 74%     Upper Body Dressing/Undressing Upper body dressing   What is the patient wearing?: Pull over shirt    Upper body assist Assist Level: Set up assist    Lower Body Dressing/Undressing Lower body dressing      What is the patient wearing?: Pants, Underwear/pull up     Lower body assist Assist for lower body dressing: Moderate Assistance - Patient 50 - 74%     Toileting Toileting    Toileting assist Assist for toileting: Moderate Assistance - Patient 50 - 74%     Transfers Chair/bed transfer  Transfers assist     Chair/bed transfer assist level: Minimal Assistance - Patient > 75% (stand pivot transfer with RW)     Locomotion Ambulation   Ambulation assist   Ambulation activity did not occur: Safety/medical concerns (dizziness/lightheadedness)          Walk 10 feet activity   Assist  Walk 10 feet activity did not occur: Safety/medical concerns (pain/fatigue/nausea)        Walk 50 feet activity   Assist Walk 50 feet with 2 turns activity did not occur: Safety/medical concerns (pain/fatigue/nausea)         Walk 150 feet activity   Assist Walk 150 feet activity did not occur: Safety/medical concerns (pain/fatigue/nausea)         Walk 10 feet on uneven surface  activity   Assist Walk 10 feet on uneven surfaces activity did not occur: Safety/medical concerns (pain/fatigue/nausea)         Wheelchair     Assist        Wheelchair assist level: Supervision/Verbal cueing Max wheelchair distance: 150    Wheelchair 50 feet with 2 turns activity    Assist        Assist Level: Supervision/Verbal cueing   Wheelchair 150 feet activity     Assist      Assist Level: Supervision/Verbal cueing   Blood pressure (!) 142/73, pulse 89, temperature 98.2 F (36.8 C), temperature source Oral, resp. rate 18, height 5' 4 (1.626 m), weight 84.1 kg, SpO2 99%.  Medical Problem List and  Plan: 1. Functional deficits secondary to right proximal tibial fracture.  Status post ORIF 06/12/2024 per Dr. Kendal.               -TDWB RLE per Dr. Kendal op-note, NWB in later note.  Continue nonweightbearing for now and will check with Ortho - no ROM restriction or brace, will keep NWB for now             -ACE wrap for support of Right knee             -patient may  shower             -ELOS/Goals: 7-10 days, mod I goals  - Team conference today please see physician documentation under team conference tab, met with team  to discuss problems,progress, and goals. Formulized individual treatment plan based on medical history, underlying problem and comorbidities.   2.  Antithrombotics: -DVT/anticoagulation:  Pharmaceutical: Lovenox  check vascular study.  Transition to aspirin  325 mg daily x  4 weeks outpatient on discharge             -antiplatelet therapy: N/A 3. Pain Management: Neurontin  400 mg 3 times daily, Robaxin  and oxycodone  as needed             -pain fairly well controlled 4. Mood/Behavior/Sleep: Zoloft  100 mg daily             -antipsychotic agents:   -Pt uses PRN zanax at home, will start 0.5mg  BID PRN 5. Neuropsych/cognition: This patient is capable of making decisions on her own behalf. 6. Skin/Wound Care: Routine skin checks 7. Fluids/Electrolytes/Nutrition: Routine in and outs with follow-up chemistries 8.  Acute blood loss anemia.  Follow-up CBC  -7/15 Hemoglobin a little lower at 10.0, continue to monitor trend 9.  Hypertension.  Cozaar  25 mg daily.  Monitor with increased mobility  - BP controlled continue to monitor  -Decreased cozaar  to 12.5 mg due to [facility], see #12    06/17/2024    5:14 AM 06/16/2024    7:33 PM 06/16/2024    1:41 PM  Vitals with BMI  Systolic 142 126 874  Diastolic 73 62 61  Pulse 89 91 100    10.  Diabetes mellitus with peripheral neuropathy.  Currently on SSI.  Patient on Glucophage 1000 mg twice daily prior to admission.  Resume as needed  -  7/16 patient requests regular diet, will change per her request  CBG (last 3)  Recent Labs    06/16/24 1639 06/16/24 2051 06/17/24 0546  GLUCAP 111* 136* 133*     11. Mildly elevated AST  -Continue to monitor   12.  Orthostatic hypotension  - Patient has compression stocking to left leg, add abdominal binder.  Will decrease Cozaar  to 12.5 mg  LOS: 2 days A FACE TO FACE EVALUATION WAS PERFORMED  Murray Collier 06/17/2024, 10:28 AM

## 2024-06-17 NOTE — Progress Notes (Signed)
 Physical Therapy Session Note  Patient Details  Name: Shirley Sullivan MRN: [id number] Date of Birth: [birth year]  Today's Date: 06/17/2024 PT Individual Time: 9199-9155, 1100-1156 PT Individual Time Calculation (min): 44 min, 56 min  Short Term Goals: Week 1:  PT Short Term Goal 1 (Week 1): STG=LTG 2/2 ELOS  Skilled Therapeutic Interventions/Progress Updates:      Treatment Session 1  Pt supine in bed upon arrival. Pt agreeable to therapy. Pt reports 4/10 R LE pain. Therapist provided rest breaks and repositioning as needed.   Pt requesting to use bathroom. Pt performed stand pivot transfer x3 with RW and min A, pt demonstrates improved adherence to R LE NWB.   Attemtped gait, however discontinued 2/2 pt seeing black spots and dizzy.   Donned knee high ted hose to L LE. Encouraged fluids. Vitals assessed: seated in WC BP 118/69 HR 94.   Pt seated in [facility] with all needs within reach and chiar alarm on.   Treatment Session 2   Pt reprots feeling swimmy headed, pt reports MD and nurse aware. Treatment session held at [facility] level. Pt reports 8/10 pain in R LE, therapist provided rest breaks and repositioning as needed, nurse present to administer medication and nausea medication.   Pt performed the following therex for R LE strengthening/ROM  1x10 active assisted LAQ with therapist assisting with ROM within pt tolerance, 1x10 active assisted with use of gait belt  1x10 quad sets holding for 5    1x20 ankle pumps with leg extended, 1x20 heel toe raises with leg flexed   1x10 holding for 5 seconds, combined DF, quad set, glute set  1x10 heel slides with use of towel to reduce friction  Pt seated in WC with all needs within reach and bed alarm on.    Therapy Documentation Precautions:  Precautions Precautions: Fall Recall of Precautions/Restrictions: Intact Precaution/Restrictions Comments: pt aware of precautions however poor implementation and poor decision  making. Restrictions Weight Bearing Restrictions Per Provider Order: Yes RLE Weight Bearing Per Provider Order: Non weight bearing Other Position/Activity Restrictions: NWB RLE  Therapy/Group: Individual Therapy  [facility] [place], [place], DPT  06/17/2024, 8:02 AM

## 2024-06-17 NOTE — Progress Notes (Signed)
 Pt had some nausea around 2300 relieved with prn zofran . Intermittent pain of right lower leg relieved with prn pain medication. Otherwise no complaints, slept well.

## 2024-06-17 NOTE — Patient Care Conference (Incomplete)
 Inpatient RehabilitationTeam Conference and Plan of Care Update Date: 06/17/2024   Time: 1:08 AM    Patient Name: Shirley Sullivan      Medical Record Number: [id number]  Date of Birth: October 18, 1961 Sex: Female         Room/Bed: 4W08C/4W08C-01 Payor Info: Payor: MEDICARE / Plan: MEDICARE PART A AND B / Product Type: *No Product type* /    Admit Date/Time:  06/15/2024 12:44 PM  Primary Diagnosis:  Right tibial fracture  Hospital Problems: Principal Problem:   Right tibial fracture    Expected Discharge Date:    Team Members Present:       Current Status/Progress Goal Weekly Team Focus  Bowel/Bladder      Pt is continent x2    Remain continent x2    Assess toileting needs prn  Swallow/Nutrition/ Hydration      ***         ADL's      ***          Mobility      ***         Communication      ***          Safety/Cognition/ Behavioral Observations     ***          Pain    Pt complains of pain on R knee/lower leg and chronic stomach discomfort/nausea    Keep pain level < 3    Pain assessments Q shift and prn  Skin    R lower leg surgical incisions, otherwise no skin issues  Incision will remain free of infection   Skin assessment Q shift and prn     Discharge Planning:      Team Discussion: *** Patient on target to meet rehab goals: {IP REHAB YES/NO WITH TPOIRJMID:75863}  *See Care Plan and progress notes for long and short-term goals.   Revisions to Treatment Plan:  ***  Teaching Needs: ***  Current Barriers to Discharge: {BARRIERS TO IPDRYJMHZ:75864}  Possible Resolutions to Barriers: ***     Medical Summary               I attest that I was present, lead the team conference, and concur with the assessment and plan of the team.

## 2024-06-17 NOTE — Progress Notes (Signed)
 Occupational Therapy Session Note  Patient Details  Name: SOLIYANA MCCHRISTIAN MRN: [id number] Date of Birth: 10-01-1961  Session 1: Today's Date: 06/17/2024 OT Individual Time: 9081-8984 OT Individual Time Calculation (min): 57 min   Session 2: Today's Date: 06/17/2024 OT Individual Time: 8495-8467 OT Individual Time Calculation (min): 28 min   Short Term Goals: Week 1:  OT Short Term Goal 1 (Week 1): STG=LTG due to ELOs  Session 1: Skilled Therapeutic Interventions/Progress Updates:  Patient agreeable to participate in OT session. Reports 4/10 pain level.   Patient participated in skilled OT session focusing on UE strengthening BP management, and functional transfer training. Patient able to complete  UE strength to increase standing, ability to complete transfers, and ability to weight shift. Patient able to complete with Paislei Dorval theraband Bicep strengthening- 2x15 Tricep- 2x15 Horizontal abduction 2x15 Y to Ts 2x15 Patient unable to stand for increased periods of time due to dizziness. Discussed with MD for management. Therapist facilitated increased use of UE for increased ADL performance.        Session 2: Skilled Therapeutic Interventions/Progress Updates:  Patient agreeable to participate in OT session. Reports no pain level.   Patient participated in skilled OT session focusing on functional mobility, sit to stand, bed mobility. Patient received in supine. Min A to EOB. Patient required min A to stand. Patient mod A for functional mobility 2 feet forward 2 feet back with increased anxiety and educated on importance of positioning, UE weight bearing, and increased functional mobility. Patient educated on safety precautions due to increased fall risk. Returned to bed, alarm on all needs in reach. .       Therapy Documentation Precautions:  Precautions Precautions: Fall Recall of Precautions/Restrictions: Intact Precaution/Restrictions Comments: pt aware of precautions however  poor implementation and poor decision making. Restrictions Weight Bearing Restrictions Per Provider Order: Yes RLE Weight Bearing Per Provider Order: Non weight bearing Other Position/Activity Restrictions: NWB RLE   Therapy/Group: Individual Therapy  D'mariea L Kathaleen Dudziak 06/17/2024, 7:38 AM

## 2024-06-17 NOTE — Progress Notes (Signed)
 Patient ID: Shirley Sullivan, female   DOB: 1961-09-19, 63 y.o.   MRN: [id number]  Met with pt to give team conference update regarding goals of supervision-mod/I level and target discharge date of 7/25. She feels she is doing better and husband is getting a ramp for the stairs into their home for discharge. Discussed follow up and equipment needs. Continue to work on discharge needs.

## 2024-06-17 NOTE — Patient Care Conference (Signed)
 Inpatient RehabilitationTeam Conference and Plan of Care Update Date: 06/17/2024   Time: 12:12 PM    Patient Name: Shirley Sullivan      Medical Record Number: [id number]  Date of Birth: [birth year] Sex: Female         Room/Bed: 4W08C/4W08C-01 Payor Info: Payor: MEDICARE / Plan: MEDICARE PART A AND B / Product Type: *No Product type* /    Admit Date/Time:  06/15/2024 12:44 PM  Primary Diagnosis:  Right tibial fracture  Hospital Problems: Principal Problem:   Right tibial fracture    Expected Discharge Date: Expected Discharge Date: 06/26/24  Team Members Present: Physician leading conference: Dr. Murray Collier Social Worker Present: Rhoda Clement, LCSW Nurse Present: Barnie Ronde, RN PT Present: Doreene Orris, PT OT Present: Other (comment) ANCEL Seip OT) PPS Coordinator present : Eleanor Colon, SLP     Current Status/Progress Goal Weekly Team Focus  Bowel/Bladder      Constipation,  Voiding WNL    Manage bowel with mod I assist    Constipation addressed  Swallow/Nutrition/ Hydration               ADL's   mod A with standing ADLs, CG to sup for balance, set up sitting ADLs   mod I to sup   precaution compliance, transfer/ standing training, standing ADL safety    Mobility   bed mobility sup/CGA, sit to stand with RW and min A, stand pivot transfer with RW and min A, gait unable at this time 2/2 pt seing black spots and having dizziness.   sup/mod I  D/C 7/25, follow up HHPT, DME needs: 18x18 WC, pt has RW, pt family is installing ramp--pt has had 2 falls in last 6 months with stair navigation. barriers: poor decision making however improved with education, intermittent dizziness    Communication                Safety/Cognition/ Behavioral Observations               Pain      Pain right leg    Pain managed < 4 with prn meds    Neurontin  and prn meds  Skin      Steri strips to right leg   Incision healing    Incision healing    Discharge  Planning:  Home with husband who is retired and able to assist her if needed. Pt has had two falls in last 6 weeks. Installing ramp on stairs into home. Will await team's recommendations   Team Discussion: Patient post right tibial plateau fracture  with ORIF; restricted ROM of right ankle. Patient with history of Lupus.; baseline nausea controlled with Zofran . Progress limited by intermittent dizziness.  Patient on target to meet rehab goals: yes, currently needs min assist for sit - stand and stand pivot transfers, CGA for balance. Needs set up for upper body care; seated and mod assist for lower body care.    *See Care Plan and progress notes for long and short-term goals.   Revisions to Treatment Plan:  N/a   Teaching Needs: Safety, medications, transfers, toileting, etc. ASA 325mg  daily x 4 weeks outpatient   Current Barriers to Discharge: Decreased caregiver support and Home enviroment access/layout  Possible Resolutions to Barriers: Family education HH follow up services DME: wheelchair     Medical Summary Current Status: Tibial fracture, HTN, ABLA, DM2, elevated AST, orthostatic hypotension  Barriers to Discharge: Hypotension;Medical stability;Self-care education  Barriers to Discharge Comments: Tibial fracture, HTN, ABLA,  DM2, elevated AST, orthostatic hypotension Possible Resolutions to [employer]: adjust bp meds, abd binder, monitor BP, adjust diet, monitor CBC   Continued Need for Acute Rehabilitation Level of Care: The patient requires daily medical management by a physician with specialized training in physical medicine and rehabilitation for the following reasons: Direction of a multidisciplinary physical rehabilitation program to maximize functional independence : Yes Medical management of patient stability for increased activity during participation in an intensive rehabilitation regime.: Yes Analysis of laboratory values and/or radiology reports with  any subsequent need for medication adjustment and/or medical intervention. : Yes   I attest that I was present, lead the team conference, and concur with the assessment and plan of the team.   Fredericka Sober B 06/17/2024, 3:03 PM

## 2024-06-17 NOTE — Consult Note (Signed)
 Neuropsychological Consultation Comprehensive Inpatient Rehab   Patient:   Shirley Sullivan   DOB:   06/15/1961  MR Number:  [id number]  Location:  [facility] [address] CENTER A [address] [place] [address] Dept: [phone] Loc: [phone]           Date of Service:   06/17/2024  Start Time:   2 PM End Time:   3 PM  Provider/Observer:  Norleen Asa, Psy.D.       Clinical Neuropsychologist       Billing Code/Service: [phone]  Reason for Service:    Shirley Sullivan is a 63 year old female referred for neuropsychological consultation during ongoing admission to the comprehensive inpatient rehabilitation unit. - History of depression with anxiety and coping issues. - Currently admitted due to decreased functional mobility following a recent mechanical fall resulting in a right tibial plateau fracture. - Experiencing significant pain and working on managing and adapting to current deficits while undergoing post-surgical rehabilitation.  - History of Presenting Problem(s): Patient sustained a right tibial plateau fracture from a mechanical fall. Underwent an ORIF of the right proximal tibia on 06/12/2024. Currently non-weight bearing on the right lower extremity.  CURRENT MEDICATIONS - Current Medications: Continues on home psychiatric medications including sertraline . Alprazolam  is prescribed on a PRN basis but is used sparingly.  PSYCHIATRIC HISTORY - Psychiatric History: History of depression and anxiety. Has a managing psychiatrist.  MEDICAL HISTORY - Personal and Family Medical History: History includes asthma, diabetes mellitus without complications, lupus, and hypertension.  RISK ASSESSMENT Risk Assessment: - Suicidal Ideation: Denied any acute worsening of depression. - Self-harm: No self-harm reported.  MENTAL STATE EXAM: - Behaviour: Good cognition demonstrated. - Mood: Reports normal mood state. Denied acute  worsening of depression or anxiety. - Affect: Positive affect observed. - Thought Process: Good understanding of physical condition and recent orthopedic interventions. - Orientation: Oriented to person, place, time, and situation. - Insight: Good insight into her physical condition.   Medical History:   Past Medical History:  Diagnosis Date   Allergy    Anxiety    Arthritis    Asthma    perfumes triggers attacks. Has inhalers for rescue   COVID-19 10/09/2020   and 08/15/20   Depression    Diabetes mellitus without complication (HCC)    History of kidney stones 1998, 2015   Lupus    Septic shock (HCC)    Klebsiella oxytocin and Enterobacter cloacae bacteremia/UTI         Patient Active Problem List   Diagnosis Date Noted   Right tibial fracture 06/15/2024   Fall 06/14/2024   Closed fracture of right tibial plateau 06/12/2024   Closed fracture of shaft of right fibula 06/12/2024   Hypertension 06/12/2024   Class 1 obesity 06/12/2024   Diabetes mellitus without complication (HCC)    Pain in both feet 04/13/2024   Acute renal failure (HCC)    Hydronephrosis    Septic shock (HCC) 10/09/2020    Impression/DX:  The patient presents with coping and adjustment issues related to a recent injury and surgery with past history of major depressive disorder and anxiety disorder.          Electronically Signed   _______________________ Norleen Asa, Psy.D. Clinical Neuropsychologist

## 2024-06-18 DIAGNOSIS — S82101D Unspecified fracture of upper end of right tibia, subsequent encounter for closed fracture with routine healing: Secondary | ICD-10-CM | POA: Diagnosis not present

## 2024-06-18 DIAGNOSIS — R748 Abnormal levels of other serum enzymes: Secondary | ICD-10-CM

## 2024-06-18 DIAGNOSIS — I951 Orthostatic hypotension: Secondary | ICD-10-CM | POA: Diagnosis not present

## 2024-06-18 DIAGNOSIS — E1142 Type 2 diabetes mellitus with diabetic polyneuropathy: Secondary | ICD-10-CM | POA: Diagnosis not present

## 2024-06-18 DIAGNOSIS — I10 Essential (primary) hypertension: Secondary | ICD-10-CM | POA: Diagnosis not present

## 2024-06-18 LAB — GLUCOSE, CAPILLARY
Glucose-Capillary: 130 mg/dL — ABNORMAL HIGH (ref 70–99)
Glucose-Capillary: 126 mg/dL — ABNORMAL HIGH (ref 70–99)
Glucose-Capillary: 123 mg/dL — ABNORMAL HIGH (ref 70–99)
Glucose-Capillary: 121 mg/dL — ABNORMAL HIGH (ref 70–99)

## 2024-06-18 NOTE — Progress Notes (Signed)
 Physical Therapy Session Note  Patient Details  Name: Shirley Sullivan MRN: [id number] Date of Birth: [birth year]  Today's Date: 06/18/2024 PT Individual Time: 8969-8884 PT Individual Time Calculation (min): 45 min   Short Term Goals: Week 1:  PT Short Term Goal 1 (Week 1): STG=LTG 2/2 ELOS  Skilled Therapeutic Interventions/Progress Updates: Patient seated in wc on entrance to room. Patient alert and agreeable to PT session.  Pt reports that she tired from last session and ready to take a nap. Pt reports that she was not strong on her L leg even before hospitalization. Pt reported some pain during the manual resistance exercises on the lateral aspect of her anterior tibialis, no intervention required.   Therapeutic Activity:  Sit to stands - 1x5 - CGA - Pt. Needed vc to push feet into the ground quickly with a forward lean.   Therapeutic Exercise: Pt performed the following exercises with therapist providing the described cuing and facilitation for improvement.  Manual resistance Exercises-  2x10 - Pt needed extensive verbal cueing to maximize muscle activation and to understand the joint movements being asked of her.  Therapist resisted active ROM with accommodating resistance to allow for consistent resistance throughout the range.   -Resisted Dorsiflexion -Resited Plantarflexion -Resisted Hip Flexion knee bent -Resisted Knee Extension    Patient left in wc at end of session with brakes locked, chair alarm set, and all needs within reach.      Therapy Documentation Precautions:  Precautions Precautions: Fall Recall of Precautions/Restrictions: Intact Precaution/Restrictions Comments: pt aware of precautions however poor implementation and poor decision making. Restrictions Weight Bearing Restrictions Per Provider Order: Yes RLE Weight Bearing Per Provider Order: Non weight bearing Other Position/Activity Restrictions: NWB RLE    Therapy/Group: Individual Therapy  Babs Dabbs 06/18/2024, 11:15 AM

## 2024-06-18 NOTE — Progress Notes (Signed)
 Occupational Therapy Session Note  Patient Details  Name: Shirley Sullivan MRN: [id number] Date of Birth: [birth year]  Session 1: Today's Date: 06/18/2024 OT Individual Time: 0903-1000 OT Individual Time Calculation (min): 57 min   Session 2: Today's Date: 06/18/2024 OT Individual Time: 1300-1400 OT Individual Time Calculation (min): 60 min   Short Term Goals: Week 1:  OT Short Term Goal 1 (Week 1): STG=LTG due to ELOs  Session 1: Skilled Therapeutic Interventions/Progress Updates:   Patient agreeable to participate in OT session. Reports 8/10 pain level. Reported to RN for medication, pre administered   Patient participated in skilled OT session focusing on standing tolerance, UE strength, core strength. Patient completed standing tolerance with small hopping in parallel bars for increased standing tolerance and functional mobility. Patient completed weight shifting from LE to UE in parallel bars. Completed wc push ups to increase tricep strengthening. Patient able to complete with mod verbal cues for form and safety. Completed UE strengthening for increase mobility.  Patient able to complete wc mobility with increased time 150 ft x2 to increase functional mobility skills with NWB precautions.     Session 2: Skilled Therapeutic Interventions/Progress Updates:   Patient agreeable to participate in OT session. Reports 5/10 pain level. Utilized therapeutic rest and repositioning  Patient participated in skilled OT session focusing on standing tolerance, transfer, training, toileting, and UE strengthening. Patient able to complete toileting in bathroom with SUP A, sup A for transfer. Patient able to complete standing dynamic activity for 1-3 minute bouts in standing . Patient able to complete wc mobility with increased ability to turn. Patient able to complete NuStep UBE to maximize UE strengthening  Therapist able to facilitate increased increased mobility including toileting transfers in  bathroom. Patient able to demonstrate increased activity tolerance today requiring less rest breaks for activity both standing and sitting. Checked off patients ability to transfer to [facility].      Therapy Documentation Precautions:  Precautions Precautions: Fall Recall of Precautions/Restrictions: Intact Precaution/Restrictions Comments: pt aware of precautions however poor implementation and poor decision making. Restrictions Weight Bearing Restrictions Per Provider Order: Yes RLE Weight Bearing Per Provider Order: Non weight bearing Other Position/Activity Restrictions: NWB RLE   Therapy/Group: Individual Therapy  D'mariea L Geremy Rister 06/18/2024, 7:46 AM

## 2024-06-18 NOTE — IPOC Note (Addendum)
 Overall Plan of Care [facility]) Patient Details Name: Shirley Sullivan MRN: [id number] DOB: Oct 15, 1961  Admitting Diagnosis: Right tibial fracture  Hospital Problems: Principal Problem:   Right tibial fracture Active Problems:   Depression with anxiety     Functional Problem List: Nursing Pain, Bowel, Safety, Endurance, Medication Management  PT Balance, Behavior, Edema, Endurance, Motor, Pain, Safety, Sensory, Skin Integrity  OT Balance, Pain, Safety, Sensory, Endurance, Motor  SLP    TR         Basic ADL's: OT Grooming, Bathing, Dressing, Toileting     Advanced  ADL's: OT       Transfers: PT Bed Mobility, Bed to Chair, Banker, Technical brewer: PT Psychologist, prison and probation services, Ambulation     Additional Impairments: OT    SLP        TR      Anticipated Outcomes Item Anticipated Outcome  Self Feeding mod I  Swallowing      Basic self-care  mod I  Toileting  SUP   Bathroom Transfers SUP  Bowel/Bladder  manage bowel w mod I and bladder w toileting  Transfers  supervision  Locomotion  supervision  Communication     Cognition     Pain  Pain < 4 wtih prns  Safety/Judgment  manage safety w cues   Therapy Plan: PT Intensity: Minimum of 1-2 x/day ,45 to 90 minutes PT Frequency: 5 out of 7 days PT Duration Estimated Length of Stay: 7-10 days OT Intensity: Minimum of 1-2 x/day, 45 to 90 minutes OT Frequency: 5 out of 7 days OT Duration/Estimated Length of Stay: 7-10     Team Interventions: Nursing Interventions Patient/Family Education, Pain Management, Medication Management, Discharge Planning, Bowel Management, Disease Management/Prevention  PT interventions Ambulation/gait training, Community reintegration, DME/adaptive equipment instruction, Neuromuscular re-education, Psychosocial support, Stair training, UE/LE Strength taining/ROM, Wheelchair propulsion/positioning, Warden/ranger, Discharge planning, Pain management, Skin  care/wound management, Therapeutic Activities, UE/LE Coordination activities, Cognitive remediation/compensation, Disease management/prevention, Functional mobility training, Patient/family education, Splinting/orthotics, Therapeutic Exercise, Visual/perceptual remediation/compensation  OT Interventions Warden/ranger, Wheelchair propulsion/positioning, UE/LE Strength taining/ROM, DME/adaptive equipment instruction, Community reintegration, Cognitive remediation/compensation, Disease mangement/prevention, Functional mobility training, Patient/family education, Visual/perceptual remediation/compensation, Therapeutic Exercise, Discharge planning, Pain management, Self Care/advanced ADL retraining, Therapeutic Activities, UE/LE Coordination activities  SLP Interventions    TR Interventions    SW/CM Interventions Discharge Planning, Psychosocial Support, Patient/Family Education   Barriers to Discharge MD  Medical stability and Weight bearing restrictions  Nursing Decreased caregiver support, Home environment access/layout, Weight bearing restrictions 1 level 2 ste right rail w spouse; has RW, BSC and shower seat  PT Home environment access/layout, Wound Care, Weight, Weight bearing restrictions, Behavior nausea/dizziness, adherence to weight bearing precautions, high car Sports administrator), two steps for entry into house  OT      SLP      SW Weight bearing restrictions     Team Discharge Planning: Destination: PT-Home ,OT- Home , SLP-  Projected Follow-up: PT-Home health PT, OT-  Home health OT, SLP-  Projected Equipment Needs: PT-To be determined, OT- 3 in 1 bedside comode, Rolling walker with 5 wheels, SLP-  Equipment Details: PT- , OT-  Patient/family involved in discharge planning: PT- Patient, Family member/caregiver,  OT-Patient, SLP-   MD ELOS: 7-10 Medical Rehab Prognosis:  Excellent Assessment: The patient has been admitted for CIR therapies with the diagnosis of right  proximal tibial fracture . The team will be addressing functional mobility, strength, stamina, balance, safety, adaptive techniques and equipment,  self-care, bowel and bladder mgt, patient and caregiver education. Goals have been set at sup/mod I. Anticipated discharge destination is home.        See Team Conference Notes for weekly updates to the plan of care

## 2024-06-18 NOTE — Progress Notes (Signed)
 Physical Therapy Session Note  Patient Details  Name: LAQUAN LUDDEN MRN: [id number] Date of Birth: 1961/09/02  Today's Date: 06/18/2024 PT Individual Time: 0800-0858 PT Individual Time Calculation (min): 58 min   Short Term Goals: Week 1:  PT Short Term Goal 1 (Week 1): STG=LTG 2/2 ELOS  Skilled Therapeutic Interventions/Progress Updates:     Pt sitting EOB upon arrival. Pt agreeable to therapy. Pt 8/10 R LE pain, therapist provided rest breaks and repsotioning as needed.   Pt emotional regarding situation that occurred with toileting during night shift. Therapist utilized therapeutic use of self to offer support/encouragement/problem solving strategies.   Pt performed stand pivot transfer bed to [facility] and back with no AD and distant supervision with use of UE support on arm rest. Pt demonstrates improved adherence to R LE NWB.   Pt performed stand pivot transfer WC to car simualtor with RW and CGA/min A, verbal cues provided for sequencing/technique. Pt utilized UE to lift R LE into car.   Pt ambulated with RW and CGA with hop to gait, verbal cues provided for technique/sequencing with emphasis on light landing.   Pt seated in [facility] with all needs within reach and seatbelt alarm on.   Therapy Documentation Precautions:  Precautions Precautions: Fall Recall of Precautions/Restrictions: Intact Precaution/Restrictions Comments: pt aware of precautions however poor implementation and poor decision making. Restrictions Weight Bearing Restrictions Per Provider Order: Yes RLE Weight Bearing Per Provider Order: Non weight bearing Other Position/Activity Restrictions: NWB RLE  Therapy/Group: Individual Therapy  [facility] Doreene Orris, [place], DPT  06/18/2024, 7:55 AM

## 2024-06-18 NOTE — Progress Notes (Signed)
 PROGRESS NOTE   Subjective/Complaints: Dizziness is improved. Family working on getting ramp.  Reports had a bowel movement yesterday.  Dizziness has improved today.  No new complaints or concerns.  ROS: Patient denies new vision changes,  vomiting, diarrhea,  shortness of breath or chest pain, headache, or mood change. + nausea- improved + Dizziness- improved    Objective:   No results found.  Recent Labs    06/16/24 0510  WBC 3.9*  HGB 10.0*  HCT 29.9*  PLT 147*   Recent Labs    06/16/24 0510  NA 139  K 3.6  CL 101  CO2 27  GLUCOSE 144*  BUN 13  CREATININE 0.60  CALCIUM 9.0    Intake/Output Summary (Last 24 hours) at 06/18/2024 1300 Last data filed at 06/18/2024 1000 Gross per 24 hour  Intake 600 ml  Output --  Net 600 ml        Physical Exam: Vital Signs Blood pressure 139/69, pulse 82, temperature 98.6 F (37 C), temperature source Oral, resp. rate 18, height 5' 4 (1.626 m), weight 84.1 kg, SpO2 100%.  General: No apparent distress, sitting in wheelchair in her room working with therapy HEENT: Head is normocephalic, atraumatic, sclera anicteric, oral mucosa little dry Neck: Tenderness along lateral neck, palpable tender nodes R>L  Heart: Reg rate and rhythm. No murmurs rubs or gallops Chest: CTA bilaterally without wheezes, rales, or rhonchi; no distress Abdomen: Soft, non-tender, non-distended, bowel sounds positive. Extremities: No clubbing, cyanosis, or edema. Pulses are 2+ Psych: Pt's affect is appropriate. Pt is cooperative Skin: Lower extremity incisions CDI Neuro: Alert and oriented x 4, follows commands, no memory deficits noted, no speech or language deficits noted, cranial nerves II through XII grossly intact,   MMT: BUE 5/5. RLE 2/5 HF, KE d/t pain. 5/5 RADF/PF.  LLE 4 to 5/5 prox to distal. Sensory exam normal for light touch and pain in all 4 limbs.   No limb ataxia or cerebellar  signs. No abnormal tone appreciated.    Prior neuro assessment is c/w today's exam 06/18/2024.   MSK:  Right knee with rom from 0-70 degrees. Right knee still swollen, bruised and tender.     Assessment/Plan: 1. Functional deficits which require 3+ hours per day of interdisciplinary therapy in a comprehensive inpatient rehab setting. Physiatrist is providing close team supervision and 24 hour management of active medical problems listed below. Physiatrist and rehab team continue to assess barriers to discharge/monitor patient progress toward functional and medical goals  Care Tool:  Bathing    Body parts bathed by patient: Right arm, Right lower leg, Left arm, Left upper leg, Chest, Left lower leg, Abdomen, Front perineal area, Buttocks, Right upper leg, Face     Body parts n/a: Buttocks, Right lower leg, Left lower leg   Bathing assist Assist Level: Moderate Assistance - Patient 50 - 74%     Upper Body Dressing/Undressing Upper body dressing   What is the patient wearing?: Pull over shirt    Upper body assist Assist Level: Set up assist    Lower Body Dressing/Undressing Lower body dressing      What is the patient wearing?: Pants, Underwear/pull up  Lower body assist Assist for lower body dressing: Moderate Assistance - Patient 50 - 74%     Toileting Toileting    Toileting assist Assist for toileting: Moderate Assistance - Patient 50 - 74%     Transfers Chair/bed transfer  Transfers assist     Chair/bed transfer assist level: Minimal Assistance - Patient > 75% (RW)     Locomotion Ambulation   Ambulation assist   Ambulation activity did not occur: Safety/medical concerns (dizziness/lightheadedness)  Assist level: Contact Guard/Touching assist Assistive device: Walker-rolling Max distance: 5 feet   Walk 10 feet activity   Assist  Walk 10 feet activity did not occur: Safety/medical concerns (fatigue)        Walk 50 feet  activity   Assist Walk 50 feet with 2 turns activity did not occur: Safety/medical concerns (pain/fatigue/nausea)         Walk 150 feet activity   Assist Walk 150 feet activity did not occur: Safety/medical concerns (pain/fatigue/nausea)         Walk 10 feet on uneven surface  activity   Assist Walk 10 feet on uneven surfaces activity did not occur: Safety/medical concerns (pain/fatigue/nausea)         Wheelchair     Assist        Wheelchair assist level: Supervision/Verbal cueing Max wheelchair distance: 150    Wheelchair 50 feet with 2 turns activity    Assist        Assist Level: Supervision/Verbal cueing   Wheelchair 150 feet activity     Assist      Assist Level: Supervision/Verbal cueing   Blood pressure 139/69, pulse 82, temperature 98.6 F (37 C), temperature source Oral, resp. rate 18, height 5' 4 (1.626 m), weight 84.1 kg, SpO2 100%.  Medical Problem List and Plan: 1. Functional deficits secondary to right proximal tibial fracture.  Status post ORIF 06/12/2024 per Dr. Kendal.               -TDWB RLE per Dr. Kendal op-note, NWB in later note.  Continue nonweightbearing for now and will check with Ortho - no ROM restriction or brace, will keep NWB for now             -ACE wrap for support of Right knee             -patient may  shower             -ELOS/Goals: 7-10 days, mod I goals  - Expected discharge 06/27/2019 plan  2.  Antithrombotics: -DVT/anticoagulation:  Pharmaceutical: Lovenox  check vascular study.  Transition to aspirin  325 mg daily x 4 weeks outpatient on discharge             -antiplatelet therapy: N/A 3. Pain Management: Neurontin  400 mg 3 times daily, Robaxin  and oxycodone  as needed             -pain fairly well controlled 4. Mood/Behavior/Sleep: Zoloft  100 mg daily             -antipsychotic agents:   -Pt uses PRN zanax at home, will start 0.5mg  BID PRN 5. Neuropsych/cognition: This patient is capable of making  decisions on her own behalf. 6. Skin/Wound Care: Routine skin checks 7. Fluids/Electrolytes/Nutrition: Routine in and outs with follow-up chemistries 8.  Acute blood loss anemia.  Follow-up CBC  -7/15 Hemoglobin a little lower at 10.0, continue to monitor trend 9.  Hypertension.  Cozaar  25 mg daily.  Monitor with increased mobility  - BP controlled continue to  monitor  -Decreased cozaar  to 12.5 mg due to [facility], see #12  - 7/17 BP a little bit higher today, continue current regimen for now and monitor    06/18/2024    5:43 AM 06/17/2024    9:01 PM 06/17/2024   12:36 PM  Vitals with BMI  Systolic 139 148 848  Diastolic 69 75 63  Pulse 82 93 95    10.  Diabetes mellitus with peripheral neuropathy.  Currently on SSI.  Patient on Glucophage 1000 mg twice daily prior to admission.  Resume as needed  - 7/16 patient requests regular diet, will change per her request  -7/17 CBG stable continue current regimen  CBG (last 3)  Recent Labs    06/17/24 2146 06/18/24 0608 06/18/24 1202  GLUCAP 130* 130* 126*     11. Mildly elevated AST  -Continue to monitor - recheck Monday ordered  12.  Orthostatic hypotension  - Patient has compression stocking to left leg, add abdominal binder.  Will decrease Cozaar  to 12.5 mg  -04/18/24 improved today, continue oral fluid intake and abdominal binder/compression.  Continue current regimen for now and monitor  LOS: 3 days A FACE TO FACE EVALUATION WAS PERFORMED  Murray Collier 06/18/2024, 1:00 PM

## 2024-06-19 ENCOUNTER — Inpatient Hospital Stay (HOSPITAL_COMMUNITY)

## 2024-06-19 DIAGNOSIS — I10 Essential (primary) hypertension: Secondary | ICD-10-CM | POA: Diagnosis not present

## 2024-06-19 DIAGNOSIS — E1142 Type 2 diabetes mellitus with diabetic polyneuropathy: Secondary | ICD-10-CM | POA: Diagnosis not present

## 2024-06-19 DIAGNOSIS — S82101D Unspecified fracture of upper end of right tibia, subsequent encounter for closed fracture with routine healing: Secondary | ICD-10-CM | POA: Diagnosis not present

## 2024-06-19 DIAGNOSIS — M7989 Other specified soft tissue disorders: Secondary | ICD-10-CM

## 2024-06-19 DIAGNOSIS — R600 Localized edema: Secondary | ICD-10-CM

## 2024-06-19 DIAGNOSIS — I951 Orthostatic hypotension: Secondary | ICD-10-CM | POA: Diagnosis not present

## 2024-06-19 LAB — GLUCOSE, CAPILLARY
Glucose-Capillary: 92 mg/dL (ref 70–99)
Glucose-Capillary: 131 mg/dL — ABNORMAL HIGH (ref 70–99)

## 2024-06-19 MED ORDER — ORAL CARE MOUTH RINSE
15.0000 mL | OROMUCOSAL | Status: DC
  Administered 2024-06-19 – 2024-06-26 (×21): 15 mL via OROMUCOSAL

## 2024-06-19 NOTE — Progress Notes (Signed)
 PROGRESS NOTE   Subjective/Complaints: Pt reports increased swelling in RLE.  Dizziness is improved, sometimes occurs when changing position but not interfering with activities.  ROS: Patient denies new vision changes,  vomiting, diarrhea,  shortness of breath or chest pain, headache, or mood change. + nausea- improved + Dizziness- improved + R leg swelling    Objective:   No results found.  No results for input(s): WBC, HGB, HCT, PLT in the last 72 hours.  No results for input(s): NA, K, CL, CO2, GLUCOSE, BUN, CREATININE, CALCIUM in the last 72 hours.   Intake/Output Summary (Last 24 hours) at 06/19/2024 1110 Last data filed at 06/19/2024 0700 Gross per 24 hour  Intake 720 ml  Output --  Net 720 ml        Physical Exam: Vital Signs Blood pressure (!) 153/82, pulse 89, temperature 98 F (36.7 C), resp. rate 17, height 5' 4 (1.626 m), weight 84.1 kg, SpO2 100%.  General: No apparent distress, sitting in wheelchair in her room working with therapy HEENT: Head is normocephalic, atraumatic, sclera anicteric, oral mucosa little dry Neck: Tenderness along lateral neck, palpable tender nodes R>L  Heart: Reg rate and rhythm. No murmurs rubs or gallops Chest: CTA bilaterally without wheezes, rales, or rhonchi; no distress Abdomen: Soft, non-tender, non-distended, bowel sounds positive. Extremities: + Edema distal RLE Psych: Pt's affect is appropriate. Pt is cooperative, very pleasant Skin: Lower extremity incisions CDI Neuro: Alert and oriented x 4, follows commands, no memory deficits noted, no speech or language deficits noted, cranial nerves II through XII grossly intact,   MMT: BUE 5/5. RLE 2/5 HF, KE d/t pain. 5/5 RADF/PF.  LLE 4 to 5/5 prox to distal. Sensory exam normal for light touch and pain in all 4 limbs.   No limb ataxia or cerebellar signs. No abnormal tone appreciated.    Prior  neuro assessment is c/w today's exam 06/19/2024.   MSK:  Right knee swollen, appropriately tender, please see image   Assessment/Plan: 1. Functional deficits which require 3+ hours per day of interdisciplinary therapy in a comprehensive inpatient rehab setting. Physiatrist is providing close team supervision and 24 hour management of active medical problems listed below. Physiatrist and rehab team continue to assess barriers to discharge/monitor patient progress toward functional and medical goals  Care Tool:  Bathing    Body parts bathed by patient: Right arm, Right lower leg, Left arm, Left upper leg, Chest, Left lower leg, Abdomen, Front perineal area, Buttocks, Right upper leg, Face     Body parts n/a: Buttocks, Right lower leg, Left lower leg   Bathing assist Assist Level: Moderate Assistance - Patient 50 - 74%     Upper Body Dressing/Undressing Upper body dressing   What is the patient wearing?: Pull over shirt    Upper body assist Assist Level: Set up assist    Lower Body Dressing/Undressing Lower body dressing      What is the patient wearing?: Pants, Underwear/pull up     Lower body assist Assist for lower body dressing: Moderate Assistance - Patient 50 - 74%     Toileting Toileting    Toileting assist Assist for toileting: Moderate Assistance -  Patient 50 - 74%     Transfers Chair/bed transfer  Transfers assist     Chair/bed transfer assist level: Minimal Assistance - Patient > 75% (RW)     Locomotion Ambulation   Ambulation assist   Ambulation activity did not occur: Safety/medical concerns (dizziness/lightheadedness)  Assist level: Contact Guard/Touching assist Assistive device: Walker-rolling Max distance: 5 feet   Walk 10 feet activity   Assist  Walk 10 feet activity did not occur: Safety/medical concerns (fatigue)        Walk 50 feet activity   Assist Walk 50 feet with 2 turns activity did not occur: Safety/medical concerns  (pain/fatigue/nausea)         Walk 150 feet activity   Assist Walk 150 feet activity did not occur: Safety/medical concerns (pain/fatigue/nausea)         Walk 10 feet on uneven surface  activity   Assist Walk 10 feet on uneven surfaces activity did not occur: Safety/medical concerns (pain/fatigue/nausea)         Wheelchair     Assist        Wheelchair assist level: Supervision/Verbal cueing Max wheelchair distance: 150    Wheelchair 50 feet with 2 turns activity    Assist        Assist Level: Supervision/Verbal cueing   Wheelchair 150 feet activity     Assist      Assist Level: Supervision/Verbal cueing   Blood pressure (!) 153/82, pulse 89, temperature 98 F (36.7 C), resp. rate 17, height 5' 4 (1.626 m), weight 84.1 kg, SpO2 100%.  Medical Problem List and Plan: 1. Functional deficits secondary to right proximal tibial fracture.  Status post ORIF 06/12/2024 per Dr. Kendal.               -TDWB RLE per Dr. Kendal op-note, NWB in later note.  Continue nonweightbearing for now and will check with Ortho - no ROM restriction or brace, will keep NWB for now             -ACE wrap for support of Right knee             -patient may  shower             -ELOS/Goals: 7-10 days, mod I goals  - Expected discharge 06/27/2019 plan  2.  Antithrombotics: -DVT/anticoagulation:  Pharmaceutical: Lovenox  check vascular study.  Transition to aspirin  325 mg daily x 4 weeks outpatient on discharge             -antiplatelet therapy: N/A 3. Pain Management: Neurontin  400 mg 3 times daily, Robaxin  and oxycodone  as needed             -pain fairly well controlled 4. Mood/Behavior/Sleep: Zoloft  100 mg daily             -antipsychotic agents:   -Pt uses PRN zanax at home, will start 0.5mg  BID PRN 5. Neuropsych/cognition: This patient is capable of making decisions on her own behalf. 6. Skin/Wound Care: Routine skin checks 7. Fluids/Electrolytes/Nutrition: Routine in  and outs with follow-up chemistries 8.  Acute blood loss anemia.  Follow-up CBC  -7/15 Hemoglobin a little lower at 10.0, continue to monitor trend 9.  Hypertension.  Cozaar  25 mg daily.  Monitor with increased mobility  - BP controlled continue to monitor  -Decreased cozaar  to 12.5 mg due to [facility], see #12  - 7/17 BP a little bit higher today, continue current regimen for now and monitor  -7/18 BP intermittently a little  bit elevated, could consider going back up on Cozaar  if this continues.  Or could  consider mild diuretic see #13. will recheck orthostatic vital signs.    06/19/2024    5:00 AM 06/19/2024    4:59 AM 06/18/2024    8:02 PM  Vitals with BMI  Systolic 153  128  Diastolic 82  65  Pulse 89 90 98    10.  Diabetes mellitus with peripheral neuropathy.  Currently on SSI.  Patient on Glucophage 1000 mg twice daily prior to admission.  Resume as needed  - 7/16 patient requests regular diet, will change per her request  -7/17 CBG stable continue current regimen  CBG (last 3)  Recent Labs    06/18/24 1202 06/18/24 1655 06/18/24 2144  GLUCAP 126* 123* 121*     11. Mildly elevated AST  -Continue to monitor - recheck Monday ordered  12.  Orthostatic hypotension  - Patient has compression stocking to left leg, add abdominal binder.  Will decrease Cozaar  to 12.5 mg  -04/18/24 improved today, continue oral fluid intake and abdominal binder/compression.  Continue current regimen for now and monitor  -5/18 will recheck orthostatic VS, overall symptoms have improved.  She reports occasional brief dizziness but this potentially may not be [place] related 13. RLE edema  - 5/18 recheck vascular ultrasound, discussed leg elevation when in bed, could consider mild diuretic  LOS: 4 days A FACE TO FACE EVALUATION WAS PERFORMED  Murray Collier 06/19/2024, 11:10 AM

## 2024-06-19 NOTE — Progress Notes (Signed)
 RLE venous duplex has been completed.   Results can be found under chart review under CV PROC. 06/19/2024 4:06 PM Jessikah Dicker RVT, RDMS

## 2024-06-19 NOTE — Progress Notes (Signed)
 Occupational Therapy Session Note  Patient Details  Name: Shirley Sullivan MRN: [id number] Date of Birth: 1961-08-14  Today's Date: 06/19/2024 OT Individual Time: 9094-9069 OT Individual Time Calculation (min): 25 min    Short Term Goals: Week 1:  OT Short Term Goal 1 (Week 1): STG=LTG due to ELOs  Skilled Therapeutic Interventions/Progress Updates:  Skilled OT intervention completed with focus on BUE endurance and ROM. Pt received seated in w/c, agreeable to session. No pain reported.  Pt requested to work on UE endurance/ROM at arm bike. Transported dependently in w/c <> gym for time. Pt completed the following intervals while seated at the UE Nustep to promote global/BUE endurance needed for independence with BADLs and functional transfers: -5 min, level 3, forwards -5 min, level 3, backwards -5 min, level 3, forwards  Pt remained seated in w/c, with chair alarm on/activated, and with all needs in reach at end of session.   Therapy Documentation Precautions:  Precautions Precautions: Fall Recall of Precautions/Restrictions: Intact Precaution/Restrictions Comments: pt aware of precautions however poor implementation and poor decision making. Restrictions Weight Bearing Restrictions Per Provider Order: Yes RLE Weight Bearing Per Provider Order: Non weight bearing Other Position/Activity Restrictions: NWB RLE    Therapy/Group: Individual Therapy  Shirley FORBES Fritter, MS, OTR/L  06/19/2024, 9:36 AM

## 2024-06-19 NOTE — Progress Notes (Signed)
 Occupational Therapy Session Note  Patient Details  Name: Shirley Sullivan MRN: [id number] Date of Birth: 04-Feb-1961  Today's Date: 06/19/2024 OT Individual Time: 0800-0900 OT Individual Time Calculation (min): 60 min    Short Term Goals: Week 1:  OT Short Term Goal 1 (Week 1): STG=LTG due to ELOs  Skilled Therapeutic Interventions/Progress Updates:   Patient agreeable to participate in OT session. Reports 5/10 pain level, and discomfort from swelling in feet. Nursing administered medication during session   Patient participated in skilled OT session focusing on toileting, dressing, grooming, and transfers. Patient able to complete stand pivot to and from bsc with distant sup to mod I. Patient able to complete UB dressing sitting EOB set up, LB dressing CG with RW standing, toileting I, tranfer to wc with RW min A due to loss of balance with shoes on. Patient able to complete grooming at sink mod I. Patient educated on safety and importance of rest with breaks. Patient handed off to OT for next therapeutic session. . Therapy Documentation Precautions:  Precautions Precautions: Fall Recall of Precautions/Restrictions: Intact Precaution/Restrictions Comments: pt aware of precautions however poor implementation and poor decision making. Restrictions Weight Bearing Restrictions Per Provider Order: Yes RLE Weight Bearing Per Provider Order: Non weight bearing Other Position/Activity Restrictions: NWB RLE   Therapy/Group: Individual Therapy  D'mariea L Angle Karel 06/19/2024, 7:51 AM

## 2024-06-19 NOTE — Progress Notes (Addendum)
 Physical Therapy Session Note  Patient Details  Name: Shirley Sullivan MRN: [id number] Date of Birth: 03-22-1961  Today's Date: 06/19/2024 PT Individual Time: 0945-1100 and 1510-1535 PT Individual Time Calculation (min): 75 min and 25 min  Short Term Goals: Week 1:  PT Short Term Goal 1 (Week 1): STG=LTG 2/2 ELOS  Skilled Therapeutic Interventions/Progress Updates: Pt presented in w/c agreeable to therapy. Pt c/o pain 7/10, nsg notified and pain meds received during session. Session focused on functional mobility and general conditioning. Pt propelled to day room with supervision and increased time. Performed squat pivot transfer to high/low mat with supervision, but required increased time for set up. Pt then participated in Sit to stand from lowered mat x 5 for RLE strengthening. Pt required intermittent cues for hand placement. Pt also participated in several rounds of horseshoes in standing for LLE standing tolerance and dynamic reaching. Pt completed transfer back to w/c in same manner as prior and propelled to NuStep. Performed squat pivot transfer to NuStep with CGA and participated trail runner program L4 with x3 extremities x 8 min for general conditioning maintaining avg 45SPM. Completed squat pivot transfer back to w/c in same manner as prior. Pt transported back to room and remained in w/c with ice pack on knee (elevated), call bell within reach and needs met.    Tx2: Pt presented in bed agreeable to therapy. Session focused on RLE therex and ROM. Provided education on elevating RLE during rest periods due to swelling at foot. Also provided education regarding importance of therex to RLE as to decrease ms atrophy. Pt performed ankle pumps, QS with 3-5 sec hold, AA fading to AROM SAQ, heel slides, and AA SLR with use of gait belt. Throughout activities encouraged increased ms activation as able. Pt left resting in bed with 2 pillows propped under leg, ice pack on knee, and current needs met.       Therapy Documentation Precautions:  Precautions Precautions: Fall Recall of Precautions/Restrictions: Intact Precaution/Restrictions Comments: pt aware of precautions however poor implementation and poor decision making. Restrictions Weight Bearing Restrictions Per Provider Order: Yes RLE Weight Bearing Per Provider Order: Non weight bearing Other Position/Activity Restrictions: NWB RLE General:   Vital Signs: Therapy Vitals Temp: 98.3 F (36.8 C) Temp Source: Oral Pulse Rate: 94 Resp: 17 BP: 139/79 Patient Position (if appropriate): Sitting Oxygen Therapy SpO2: 98 % O2 Device: Room Air Pain: Pain Assessment Pain Scale: 0-10 Pain Score: 9  Pain Location: Leg Pain Intervention(s): Medication (See eMAR)    Therapy/Group: Individual Therapy  Rayneisha Bouza 06/19/2024, 4:18 PM

## 2024-06-19 NOTE — Progress Notes (Addendum)
 Patient ID: Shirley Sullivan, female   DOB: [birth year], 63 y.o.   MRN: [id number] Met with pt to discuss equipment needs. She will need wheelchair and drop-arm bedside commode. Have made referral to [employer] for both pieces of equipment. She is aware may have a co-pay for equipment. Continue to work on discharge needs.  1:46 PM Referral made to [facility] for PT & OT pt had no preference. Order sent to liaison

## 2024-06-20 DIAGNOSIS — F418 Other specified anxiety disorders: Secondary | ICD-10-CM | POA: Diagnosis not present

## 2024-06-20 DIAGNOSIS — S82101D Unspecified fracture of upper end of right tibia, subsequent encounter for closed fracture with routine healing: Secondary | ICD-10-CM | POA: Diagnosis not present

## 2024-06-20 DIAGNOSIS — G8918 Other acute postprocedural pain: Secondary | ICD-10-CM | POA: Diagnosis not present

## 2024-06-20 LAB — GLUCOSE, CAPILLARY
Glucose-Capillary: 133 mg/dL — ABNORMAL HIGH (ref 70–99)
Glucose-Capillary: 115 mg/dL — ABNORMAL HIGH (ref 70–99)
Glucose-Capillary: 128 mg/dL — ABNORMAL HIGH (ref 70–99)
Glucose-Capillary: 118 mg/dL — ABNORMAL HIGH (ref 70–99)

## 2024-06-20 NOTE — Progress Notes (Signed)
 Physical Therapy Session Note  Patient Details  Name: Shirley Sullivan MRN: [id number] Date of Birth: 1961-04-08  Today's Date: 06/20/2024 PT Individual Time: 8884-8844 PT Individual Time Calculation (min): 40 min   Short Term Goals: Week 1:  PT Short Term Goal 1 (Week 1): STG=LTG 2/2 ELOS  Skilled Therapeutic Interventions/Progress Updates:    Chart reviewed and pt agreeable to therapy. Pt received seated in bed with c/o mild pain in R LE and high fatigue. Session focused on functional transfers and amb to promote safe home access. Pt initiated session with transfer to EOB using S followed by SPT to [facility] using close S for transfer and minA for LE clothing management. Pt noted to have good safety awareness during transfer. Pt then completed blocked practice of side hopping along bed. Pt able to complete 8 sets of side hops along bedside with CGA + RW fading to close S + RW. Pt then completed bed mobility to return to bed with S. Pt completed all activities with good awareness of RLE NWB precaution. At end of session, pt was left semi-reclined in bed with alarm engaged, nurse call bell and all needs in reach.     Therapy Documentation Precautions:  Precautions Precautions: Fall Recall of Precautions/Restrictions: Intact Precaution/Restrictions Comments: pt aware of precautions however poor implementation and poor decision making. Restrictions Weight Bearing Restrictions Per Provider Order: Yes RLE Weight Bearing Per Provider Order: Non weight bearing Other Position/Activity Restrictions: NWB RLE General:       Therapy/Group: Individual Therapy  Warrick KANDICE Raspberry 06/20/2024, 12:03 PM

## 2024-06-20 NOTE — Progress Notes (Signed)
 PROGRESS NOTE   Subjective/Complaints:  No new issues , feels like she is making improvements and looking forward to going home on Friday   ROS: Patient denies new vision changes,  vomiting, diarrhea,  shortness of breath or chest pain, headache, or mood change. + nausea- improved + Dizziness- improved + R leg swelling    Objective:   VAS US  LOWER EXTREMITY VENOUS (DVT) Result Date: 06/20/2024  Lower Venous DVT Study Patient Name:  Shirley Sullivan  Date of Exam:   06/19/2024 Medical Rec #: [id number]      Accession #:    [id number] Date of Birth: 1961-09-03       Patient Gender: F Patient Age:   63 years Exam Location:  [facility] Procedure:      VAS US  LOWER EXTREMITY VENOUS (DVT) Referring Phys: MURRAY COLLIER --------------------------------------------------------------------------------  Indications: Swelling, and Rehab patient. Other Indications: Rehab patient. Risk Factors: Fall on 06/12/2024 with right tibial plateau fracture s/p ORIF of right proximal tibial fracture. Comparison Study: Previous exam on 06/15/2024 was negative for DVT. Performing Technologist: Ezzie Potters RVT, RDMS  Examination Guidelines: A complete evaluation includes B-mode imaging, spectral Doppler, color Doppler, and power Doppler as needed of all accessible portions of each vessel. Bilateral testing is considered an integral part of a complete examination. Limited examinations for reoccurring indications may be performed as noted. The reflux portion of the exam is performed with the patient in reverse Trendelenburg.  +---------+---------------+---------+-----------+----------+--------------+ RIGHT    CompressibilityPhasicitySpontaneityPropertiesThrombus Aging +---------+---------------+---------+-----------+----------+--------------+ CFV      Full           Yes      Yes                                  +---------+---------------+---------+-----------+----------+--------------+ SFJ      Full                                                        +---------+---------------+---------+-----------+----------+--------------+ FV Prox  Full           Yes      Yes                                 +---------+---------------+---------+-----------+----------+--------------+ FV Mid   Full           Yes      Yes                                 +---------+---------------+---------+-----------+----------+--------------+ FV DistalFull           Yes      Yes                                 +---------+---------------+---------+-----------+----------+--------------+ PFV  Full                                                        +---------+---------------+---------+-----------+----------+--------------+ POP      Full           Yes      Yes                                 +---------+---------------+---------+-----------+----------+--------------+ PTV      Full                                                        +---------+---------------+---------+-----------+----------+--------------+ PERO     Full                                                        +---------+---------------+---------+-----------+----------+--------------+   +----+---------------+---------+-----------+----------+--------------+ LEFTCompressibilityPhasicitySpontaneityPropertiesThrombus Aging +----+---------------+---------+-----------+----------+--------------+ CFV Full           Yes      Yes                                 +----+---------------+---------+-----------+----------+--------------+    Summary: RIGHT: - There is no evidence of deep vein thrombosis in the lower extremity.  - No cystic structure found in the popliteal fossa. Subcutaneous edema of calf and ankle.  LEFT: - No evidence of common femoral vein obstruction.   *See table(s) above for measurements and observations.  Electronically signed by Fonda Rim on 06/20/2024 at 9:57:09 AM.    Final     No results for input(s): WBC, HGB, HCT, PLT in the last 72 hours.  No results for input(s): NA, K, CL, CO2, GLUCOSE, BUN, CREATININE, CALCIUM in the last 72 hours.   Intake/Output Summary (Last 24 hours) at 06/20/2024 1012 Last data filed at 06/20/2024 0802 Gross per 24 hour  Intake 698 ml  Output --  Net 698 ml        Physical Exam: Vital Signs Blood pressure (!) 167/76, pulse 96, temperature 98.6 F (37 C), temperature source Oral, resp. rate 19, height 5' 4 (1.626 m), weight 84.1 kg, SpO2 100%.   General: No acute distress Mood and affect are appropriate Heart: Regular rate and rhythm no rubs murmurs or extra sounds Lungs: Clear to auscultation, breathing unlabored, no rales or wheezes Abdomen: Positive bowel sounds, soft nontender to palpation, nondistended Extremities: No clubbing, cyanosis, or edema Skin: No evidence of breakdown, no evidence of rash  Neuro: Alert and oriented x 4, follows commands, no memory deficits noted, no speech or language deficits noted, cranial nerves II through XII grossly intact,   MMT: BUE 5/5. RLE 2/5 HF, KE d/t pain. 5/5 RADF/PF.  LLE 4 to 5/5 prox to distal. Sensory exam normal for light touch and pain in all 4 limbs.   No limb ataxia or cerebellar signs. No abnormal tone appreciated.    Prior  neuro assessment is c/w today's exam 06/20/2024.   MSK:  Right knee swollen, appropriately tender, please see image   Assessment/Plan: 1. Functional deficits which require 3+ hours per day of interdisciplinary therapy in a comprehensive inpatient rehab setting. Physiatrist is providing close team supervision and 24 hour management of active medical problems listed below. Physiatrist and rehab team continue to assess barriers to discharge/monitor patient progress toward functional and medical goals  Care Tool:  Bathing    Body parts  bathed by patient: Right arm, Right lower leg, Left arm, Left upper leg, Chest, Left lower leg, Abdomen, Front perineal area, Buttocks, Right upper leg, Face     Body parts n/a: Buttocks, Right lower leg, Left lower leg   Bathing assist Assist Level: Moderate Assistance - Patient 50 - 74%     Upper Body Dressing/Undressing Upper body dressing   What is the patient wearing?: Pull over shirt    Upper body assist Assist Level: Set up assist    Lower Body Dressing/Undressing Lower body dressing      What is the patient wearing?: Pants, Underwear/pull up     Lower body assist Assist for lower body dressing: Moderate Assistance - Patient 50 - 74%     Toileting Toileting    Toileting assist Assist for toileting: Moderate Assistance - Patient 50 - 74%     Transfers Chair/bed transfer  Transfers assist     Chair/bed transfer assist level: Minimal Assistance - Patient > 75% (RW)     Locomotion Ambulation   Ambulation assist   Ambulation activity did not occur: Safety/medical concerns (dizziness/lightheadedness)  Assist level: Contact Guard/Touching assist Assistive device: Walker-rolling Max distance: 5 feet   Walk 10 feet activity   Assist  Walk 10 feet activity did not occur: Safety/medical concerns (fatigue)        Walk 50 feet activity   Assist Walk 50 feet with 2 turns activity did not occur: Safety/medical concerns (pain/fatigue/nausea)         Walk 150 feet activity   Assist Walk 150 feet activity did not occur: Safety/medical concerns (pain/fatigue/nausea)         Walk 10 feet on uneven surface  activity   Assist Walk 10 feet on uneven surfaces activity did not occur: Safety/medical concerns (pain/fatigue/nausea)         Wheelchair     Assist        Wheelchair assist level: Supervision/Verbal cueing Max wheelchair distance: 150    Wheelchair 50 feet with 2 turns activity    Assist        Assist Level:  Supervision/Verbal cueing   Wheelchair 150 feet activity     Assist      Assist Level: Supervision/Verbal cueing   Blood pressure (!) 167/76, pulse 96, temperature 98.6 F (37 C), temperature source Oral, resp. rate 19, height 5' 4 (1.626 m), weight 84.1 kg, SpO2 100%.  Medical Problem List and Plan: 1. Functional deficits secondary to right proximal tibial fracture.  Status post ORIF 06/12/2024 per Dr. Kendal.               -TDWB RLE per Dr. Kendal op-note, NWB in later note.  Continue nonweightbearing for now and will check with Ortho - no ROM restriction or brace, will keep NWB for now             -ACE wrap for support of Right knee             -patient may  shower             -  ELOS/Goals: 7-10 days, mod I goals  - Expected discharge 06/27/2019 plan  2.  Antithrombotics: -DVT/anticoagulation:  Pharmaceutical: Lovenox  40mg  [place] every day , Normal vascular study RLE.  Transition to aspirin  325 mg daily x 4 weeks outpatient on discharge             -antiplatelet therapy: N/A 3. Pain Management: Neurontin  400 mg 3 times daily, Robaxin  and oxycodone  as needed             -pain fairly well controlled, taking 60mg  oxy IR per day, may benefit from taper PTA    4. Mood/Behavior/Sleep: Zoloft  100 mg daily             -antipsychotic agents:   -Pt uses PRN zanax at home, will start 0.5mg  BID PRN 5. Neuropsych/cognition: This patient is capable of making decisions on her own behalf. 6. Skin/Wound Care: Routine skin checks 7. Fluids/Electrolytes/Nutrition: Routine in and outs with follow-up chemistries 8.  Acute blood loss anemia.  Follow-up CBC  -7/15 Hemoglobin a little lower at 10.0, continue to monitor trend 9.  Hypertension.  Cozaar  25 mg daily.  Monitor with increased mobility  - BP controlled continue to monitor  -Decreased cozaar  to 12.5 mg due to [facility], see #12  - 7/17 BP a little bit higher today, continue current regimen for now and monitor  -7/18 BP intermittently a little bit  elevated, could consider going back up on Cozaar  if this continues.  Or could  consider mild diuretic see #13. will recheck orthostatic vital signs.    06/20/2024    3:32 AM 06/19/2024    7:40 PM 06/19/2024    1:16 PM  Vitals with BMI  Systolic 167 132 860  Diastolic 76 61 79  Pulse 96 89 94    10.  Diabetes mellitus with peripheral neuropathy.  Currently on SSI.  Patient on Glucophage 1000 mg twice daily prior to admission.  Resume as needed  - 7/16 patient requests regular diet, will change per her request  -7/17 CBG stable continue current regimen  CBG (last 3)  Recent Labs    06/19/24 1135 06/19/24 1627 06/20/24 0555  GLUCAP 92 131* 133*     11. Mildly elevated AST  -Continue to monitor - recheck Monday ordered  12.  Orthostatic hypotension  - Patient has compression stocking to left leg, add abdominal binder.  Will decrease Cozaar  to 12.5 mg  -04/18/24 improved today, continue oral fluid intake and abdominal binder/compression.  Continue current regimen for now and monitor  -5/18 will recheck orthostatic VS, overall symptoms have improved.  She reports occasional brief dizziness but this potentially may not be [place] related 13. RLE edema  vascular ultrasound negative for DVT , discussed leg elevation when in bed, could consider mild diuretic  LOS: 5 days A FACE TO FACE EVALUATION WAS PERFORMED  Prentice FORBES Compton 06/20/2024, 10:12 AM

## 2024-06-20 NOTE — Plan of Care (Signed)
  Problem: Consults Goal: RH GENERAL PATIENT EDUCATION Description: See Patient Education module for education specifics. Outcome: Progressing   Problem: RH BOWEL ELIMINATION Goal: RH STG MANAGE BOWEL WITH ASSISTANCE Description: STG Manage Bowel with mod I Assistance. Outcome: Progressing Goal: RH STG MANAGE BOWEL W/MEDICATION W/ASSISTANCE Description: STG Manage Bowel with Medication with  mod I Assistance. Outcome: Progressing   Problem: RH SAFETY Goal: RH STG ADHERE TO SAFETY PRECAUTIONS W/ASSISTANCE/DEVICE Description: STG Adhere to Safety Precautions With cues Assistance/Device. Outcome: Progressing   Problem: RH PAIN MANAGEMENT Goal: RH STG PAIN MANAGED AT OR BELOW PT'S PAIN GOAL Description: < 4 with prns Outcome: Progressing   Problem: RH KNOWLEDGE DEFICIT GENERAL Goal: RH STG INCREASE KNOWLEDGE OF SELF CARE AFTER HOSPITALIZATION Description: Patient and spouse will be able to manage care at discharge using educational resources for medications, weight bearing precautions, dietary modification , etc, independently Outcome: Progressing

## 2024-06-20 NOTE — Progress Notes (Signed)
 Occupational Therapy Session Note  Patient Details  Name: Shirley Sullivan MRN: [id number] Date of Birth: [birth year]  Today's Date: 06/20/2024 OT Individual Time: 9072-8962 OT Individual Time Calculation (min): 70 min    Short Term Goals: Week 1:  OT Short Term Goal 1 (Week 1): STG=LTG due to ELOs  Skilled Therapeutic Interventions/Progress Updates:   Patient agreeable to participate in OT session. Reports no pain level.   Patient participated in skilled OT session focusing on education on functional mobility, safety, mobility of LE, pain management, and UE strengthening. Patient completed transfers to and from [facility] to increase safety mod I. Patient able to complete LE strengthening to aid in bed mobility and proper positioning to decrease contracture formation. Patient educated on ADLS, mobility and safety. Patient completed UE strengthening 3x15 in all planes to increase ability to utilize walker with UE strength.   Therapy Documentation Precautions:  Precautions Precautions: Fall Recall of Precautions/Restrictions: Intact Precaution/Restrictions Comments: pt aware of precautions however poor implementation and poor decision making. Restrictions Weight Bearing Restrictions Per Provider Order: Yes RLE Weight Bearing Per Provider Order: Non weight bearing Other Position/Activity Restrictions: NWB RLE  Therapy/Group: Individual Therapy  Shirley Sullivan 06/20/2024, 7:53 AM

## 2024-06-20 NOTE — Progress Notes (Signed)
 Occupational Therapy Session Note  Patient Details  Name: Shirley Sullivan MRN: [id number] Date of Birth: 1961/01/17  Today's Date: 06/20/2024 OT Individual Time: 0100-0215 OT Individual Time Calculation (min): 75 min    Short Term Goals: Week 1:  OT Short Term Goal 1 (Week 1): STG=LTG due to ELOs  Skilled Therapeutic Interventions/Progress Updates:    Patient resting in bed at the time of arrival with a pain response of 8 on 0-10 with the R Tibia, nursing was made aware, the patient went on to say that she was unable to rest during the night. The pt  began the session with recalling her precautionary measures for the RLE. The pt presented as somewhat emotional at the time of treatment and was encouraged for focus on her objective regarding a safe and Ind return to home. The pt was able to transfer from supine in bed to EOB with SBA. Nutrition came in and the pt was able to select her mean choices.  The pt went on to complete simulated task in LB dressing using theraband. 3x seated EOB using the reacher for donning the theraband over  her hips and bottom initiated with the  RLE first using the reacher for greater range. The pt was instructed to use her dominant UE to reach in the back or the pant ,close to the seam with the hand facing outward for adjusting the pant  over her hips and bottom for both sides of the body.  The pt was able to demonstrate effective carryover. The pt went on to complete UB exercises using a 2lb dowel  1 sets of 13 for shld flexion, chest press, and horizontal abduction with rest breaks as needed, the pt required 2 rest breaks.  At the end of the session, the pt was transferred back to bed LOF with closeS. The call light and bedside table were placed within reach with the bed alarm activated.   Therapy Documentation Precautions:  Precautions Precautions: Fall Recall of Precautions/Restrictions: Intact Precaution/Restrictions Comments: pt aware of precautions however poor  implementation and poor decision making. Restrictions Weight Bearing Restrictions Per Provider Order: Yes RLE Weight Bearing Per Provider Order: Non weight bearing Other Position/Activity Restrictions: NWB RLE   Therapy/Group: Individual Therapy  Elvera JONETTA Mace 06/20/2024, 4:24 PM

## 2024-06-21 LAB — GLUCOSE, CAPILLARY
Glucose-Capillary: 134 mg/dL — ABNORMAL HIGH (ref 70–99)
Glucose-Capillary: 112 mg/dL — ABNORMAL HIGH (ref 70–99)
Glucose-Capillary: 149 mg/dL — ABNORMAL HIGH (ref 70–99)
Glucose-Capillary: 123 mg/dL — ABNORMAL HIGH (ref 70–99)

## 2024-06-21 NOTE — Progress Notes (Signed)
 Physical Therapy Session Note  Patient Details  Name: Shirley Sullivan MRN: [id number] Date of Birth: Jun 23, 1961  Today's Date: 06/21/2024 PT Individual Time: 0800-0844 PT Individual Time Calculation (min): 44 min   Short Term Goals: Week 1:  PT Short Term Goal 1 (Week 1): STG=LTG 2/2 ELOS  Skilled Therapeutic Interventions/Progress Updates:      Pt supine in bed upon arrival. Pt agreeable to therapy. Pt reports 5/10 R LE.   Donned B LE ted hose while seated EOB with mod I for L LE, pt required assist for R LE.   Of note: increased edema in R LE today with significantly diminished ROM.  Education provided for positioning in [facility] and in bed for edema management with emphasis on elevating R LE. Pt verbalized understanding and agreeable.    Reviewed and performed the following HEP, handout provided. Highly encouraged pt to perform these when not in therapy for R LE ROM/strength/edema management.    Access Code: [email or web address] URL: [email or web address] Date: 06/21/2024 Prepared by: Doreene Orris  Exercises - Supine Ankle Pumps  - 1 x daily - 7 x weekly - 3 sets - 10 reps - Gluteal Sets  - 1 x daily - 7 x weekly - 3 sets - 10 reps - 5 hold - Supine Quadricep Sets  - 1 x daily - 7 x weekly - 3 sets - 10 reps - Supine Hip Abduction AROM  - 1 x daily - 7 x weekly - 3 sets - 10 reps - Supine Heel Slide  - 1 x daily - 7 x weekly - 3 sets - 10 reps - Supine Active Straight Leg Raise  - 1 x daily - 7 x weekly - 3 sets - 10 reps - Seated Long Arc Quad  - 1 x daily - 7 x weekly - 3 sets - 10 reps  Pt ambulated 5 feet with RW and hop to gait, pt required seated rest break 2/2 pain/fatigue.   Pt performed standing R LE hip extension x5 prior to requiring seated rest break 2/2 dizziness.   Pt performed squat pivot transfer bed to [facility] with supervision, verbal cues provided to lock brakes.   Pt supine in bed with R LE elevated and all needs within reach at end of session.     Therapy  Documentation Precautions:  Precautions Precautions: Fall Recall of Precautions/Restrictions: Intact Precaution/Restrictions Comments: pt aware of precautions however poor implementation and poor decision making. Restrictions Weight Bearing Restrictions Per Provider Order: Yes RLE Weight Bearing Per Provider Order: Non weight bearing Other Position/Activity Restrictions: NWB RLE  Therapy/Group: Individual Therapy  [facility], [place], DPT  06/21/2024, 8:08 AM

## 2024-06-21 NOTE — Plan of Care (Signed)
  Problem: Consults Goal: RH GENERAL PATIENT EDUCATION Description: See Patient Education module for education specifics. Outcome: Progressing   Problem: RH BOWEL ELIMINATION Goal: RH STG MANAGE BOWEL WITH ASSISTANCE Description: STG Manage Bowel with mod I Assistance. Outcome: Adequate for Discharge Goal: RH STG MANAGE BOWEL W/MEDICATION W/ASSISTANCE Description: STG Manage Bowel with Medication with  mod I Assistance. Outcome: Adequate for Discharge   Problem: RH SAFETY Goal: RH STG ADHERE TO SAFETY PRECAUTIONS W/ASSISTANCE/DEVICE Description: STG Adhere to Safety Precautions With cues Assistance/Device. Outcome: Adequate for Discharge   Problem: RH PAIN MANAGEMENT Goal: RH STG PAIN MANAGED AT OR BELOW PT'S PAIN GOAL Description: < 4 with prns Outcome: Progressing Note: For the most part the pain is managed and there are breaks in between when prn's are due that the patient doesn't need medication

## 2024-06-22 DIAGNOSIS — S82101D Unspecified fracture of upper end of right tibia, subsequent encounter for closed fracture with routine healing: Secondary | ICD-10-CM | POA: Diagnosis not present

## 2024-06-22 DIAGNOSIS — R21 Rash and other nonspecific skin eruption: Secondary | ICD-10-CM

## 2024-06-22 DIAGNOSIS — I10 Essential (primary) hypertension: Secondary | ICD-10-CM | POA: Diagnosis not present

## 2024-06-22 DIAGNOSIS — E1142 Type 2 diabetes mellitus with diabetic polyneuropathy: Secondary | ICD-10-CM | POA: Diagnosis not present

## 2024-06-22 DIAGNOSIS — R748 Abnormal levels of other serum enzymes: Secondary | ICD-10-CM | POA: Diagnosis not present

## 2024-06-22 LAB — BASIC METABOLIC PANEL WITH GFR
Anion gap: 8 (ref 5–15)
BUN: 10 mg/dL (ref 8–23)
CO2: 28 mmol/L (ref 22–32)
Calcium: 8.9 mg/dL (ref 8.9–10.3)
Chloride: 105 mmol/L (ref 98–111)
Creatinine, Ser: 0.71 mg/dL (ref 0.44–1.00)
GFR, Estimated: >60 mL/min (ref >60)
Glucose, Bld: 132 mg/dL — ABNORMAL HIGH (ref 70–99)
Potassium: 4.6 mmol/L (ref 3.5–5.1)
Sodium: 141 mmol/L (ref 135–145)

## 2024-06-22 LAB — HEPATIC FUNCTION PANEL
ALT: 21 U/L (ref 0–44)
AST: 30 U/L (ref 15–41)
Albumin: 2.6 g/dL — ABNORMAL LOW (ref 3.5–5.0)
Alkaline Phosphatase: 48 U/L (ref 38–126)
Bilirubin, Direct: 0.2 mg/dL (ref 0.0–0.2)
Indirect Bilirubin: 0.7 mg/dL (ref 0.3–0.9)
Total Bilirubin: 0.9 mg/dL (ref 0.0–1.2)
Total Protein: 6.4 g/dL — ABNORMAL LOW (ref 6.5–8.1)

## 2024-06-22 LAB — GLUCOSE, CAPILLARY
Glucose-Capillary: 132 mg/dL — ABNORMAL HIGH (ref 70–99)
Glucose-Capillary: 107 mg/dL — ABNORMAL HIGH (ref 70–99)
Glucose-Capillary: 86 mg/dL (ref 70–99)
Glucose-Capillary: 140 mg/dL — ABNORMAL HIGH (ref 70–99)

## 2024-06-22 LAB — CBC
HCT: 30.1 % — ABNORMAL LOW (ref 36.0–46.0)
Hemoglobin: 9.7 g/dL — ABNORMAL LOW (ref 12.0–15.0)
MCH: 29 pg (ref 26.0–34.0)
MCHC: 32.2 g/dL (ref 30.0–36.0)
MCV: 89.9 fL (ref 80.0–100.0)
Platelets: 184 K/uL (ref 150–400)
RBC: 3.35 MIL/uL — ABNORMAL LOW (ref 3.87–5.11)
RDW: 14.8 % (ref 11.5–15.5)
WBC: 3.3 K/uL — ABNORMAL LOW (ref 4.0–10.5)
nRBC: 0 % (ref 0.0–0.2)

## 2024-06-22 MED ORDER — HYDROCORTISONE 1 % EX CREA
TOPICAL_CREAM | Freq: Two times a day (BID) | CUTANEOUS | Status: DC
  Filled 2024-06-22: qty 28

## 2024-06-22 NOTE — Plan of Care (Signed)
 Upgraded goals 2/2 pt progressing quicker than anticipated  Problem: RH Balance Goal: LTG Patient will maintain dynamic sitting balance (PT) Description: LTG:  Patient will maintain dynamic sitting balance with assistance during mobility activities (PT) Flowsheets (Taken 06/22/2024 1222) LTG: Pt will maintain dynamic sitting balance during mobility activities with:: Independent with assistive device    Problem: Sit to Stand Goal: LTG:  Patient will perform sit to stand with assistance level (PT) Description: LTG:  Patient will perform sit to stand with assistance level (PT) Flowsheets (Taken 06/22/2024 1222) LTG: PT will perform sit to stand in preparation for functional mobility with assistance level: Independent with assistive device   Problem: RH Car Transfers Goal: LTG Patient will perform car transfers with assist (PT) Description: LTG: Patient will perform car transfers with assistance (PT). Flowsheets (Taken 06/22/2024 1222) LTG: Pt will perform car transfers with assist:: Supervision/Verbal cueing

## 2024-06-22 NOTE — Progress Notes (Signed)
 Physical Therapy Session Note  Patient Details  Name: Shirley Sullivan MRN: [id number] Date of Birth: 06/04/1961  Today's Date: 06/22/2024 PT Individual Time: 0915-1000, 8699-8655, 8567-8472 PT Individual Time Calculation (min): 45 min, 44 min, 55 min  Short Term Goals: Week 1:  PT Short Term Goal 1 (Week 1): STG=LTG 2/2 ELOS  Skilled Therapeutic Interventions/Progress Updates:      Treatment Session 1  Pt seated EOB upon arrivla. Pt agreeable to therapy. Pt reports 4/10 R LE pain, therapist provided rest breaks and repositioning as needed.   Pt reports pt husband has purchased ramp for home entry. Pt confirms WC able to fit throughout house without difficulty.   Therapist doffed/donned R LE ace wrap for improved pt comfort and edema management.   Pt performed stand pivot transfer with use of arm rest with CGA, verbal cues provided for managing WC, and UE positioning.   Pt ambulated 2x5 feet in // bars with min A, verbal cues provided for technique and seqencing, however disocntinued 2/2 fatigue and L LE buckling.   Pt performed the following therex for B LE strengthening/ROM:   Standing R LE hip abduction in // bars  2x10 LAQ L LE with 4# ankle weight  1x10 R LE LAQ- ~45 deg actively  Pt seated in WC at end of session with all needs within reach and bed alarm on.   Treatment Session 2   Pt seated on BSC upon arrival. Pt agreeable to therapy. Pt denies any pain.   Pt continent of bladder, pt performed pericare while seated and stand pivot transfer to bed with mod I.   Pt donned L LE ted hose and L LE shoe while seated EOB with supervision, verbal cues provided for technique with ted hose with emphasis of no wrinkles.   Stand pivot transfer bed to [facility] with supervision, verbal cues provided for safety and positioning/locking of WC.   Pt donned/doffed leg rests with HOH assist progressing to supervision.   Pt ambulated 1x3, 1x7 feet with RW and CGA, pt demonstrates improved  clearance, and technique but quickly fatigues.   Pt seated EOB at end of session with all needs within reach.   Treatment Session 3   Pt seated EOB upon arrival. Pt agreeable to therapy. Pt denies any pain.   Pt positioned WC, locked brakes, and performed stand pivot transfer with supervision, questioning cues provided for [facility] management.   Pt self propelled WC room to ortho gym with mod I with use of B UE/L LE, pt required supervision for technique for donning L LE leg rest.  Pt navigated up/down ramp in WC with supervision progressing to mod I, ascending backwards and descending forwards with use of B UE/L LE.  Pt positioned WC next to apartment bed for stand pivot transfer with RW and CGA, pt required increased time for problem solving to remember to doff leg rests prior to trying to stand. Verbal cues provided for B UE positioning on WC arm rests for sit to stand, and safety with RW with stand pivot. Pt performed x3 each direction, and demonstrates improved carry over with repeititon. Pt overall demonstrates greatly improved hop when transferring to R, pt demonstrates and reports increased difficulty hopping to transfer to L. Will continue to work on this.   Active assisted knee extension x10-with emphasis on pt performing as much as she can actively and then using L LE to assist with remaining motion.   Pt seated EOB at end of session with all  needs within reach.     Therapy Documentation Precautions:  Precautions Precautions: Fall Recall of Precautions/Restrictions: Intact Precaution/Restrictions Comments: pt aware of precautions however poor implementation and poor decision making. Restrictions Weight Bearing Restrictions Per Provider Order: Yes RLE Weight Bearing Per Provider Order: Non weight bearing Other Position/Activity Restrictions: NWB RLE  Therapy/Group: Individual Therapy  [facility] [place], Tuttle, DPT  06/22/2024, 9:22 AM

## 2024-06-22 NOTE — Progress Notes (Signed)
 Occupational Therapy Session Note  Patient Details  Name: Shirley Sullivan MRN: [id number] Date of Birth: [birth year]  Today's Date: 06/22/2024 OT Individual Time: 8954-8844 OT Individual Time Calculation (min): 70 min    Short Term Goals: Week 1:  OT Short Term Goal 1 (Week 1): STG=LTG due to ELOs  Skilled Therapeutic Interventions/Progress Updates:    Pt resting in w/c upon arrival. Skilled OT intervention with focus on w/c mobility, BUE therex (strengthening and conditioning), DME/home safety education, and activity tolerance to increase independence with BADLs. UBE 7 min level 5 and 10 min rolling hills levels 2-12 with rest break. Pt demonstrated BUE therex circuit 3x10. Ball taps with 2# bar 3x10. Discussed home safety and DME recommendations. Pt propelled w/c to room and positioned w/c next to bed. All needs within reach.   Therapy Documentation Precautions:  Precautions Precautions: Fall Recall of Precautions/Restrictions: Intact Precaution/Restrictions Comments: pt aware of precautions however poor implementation and poor decision making. Restrictions Weight Bearing Restrictions Per Provider Order: Yes RLE Weight Bearing Per Provider Order: Non weight bearing Other Position/Activity Restrictions: NWB RLE    Pain: Pt reported 10/10 pain at beginning of session; RN admin meds with some relief noted at end of session   Therapy/Group: Individual Therapy  Maritza Debby Mare 06/22/2024, 12:01 PM

## 2024-06-22 NOTE — Progress Notes (Signed)
 Pt called at the beginning of the shift with concerns about Right new knee swelling, itching, and redness. Assessed and noted swelling with nonpitting edema from incision to foot and warm to touch. Contacted PA Dan to discuss patients concerns. Advised to ice, elevate, and administer PO meds as needed. Monitor throughout the night and proceed with further interventions need be in the am. Notified Charge Nurse Corazon to take an updated picture to be uploaded to chart.   Shirley Gammon, LPN

## 2024-06-22 NOTE — Progress Notes (Addendum)
 PROGRESS NOTE   Subjective/Complaints:  Patient has rash on her lower back.  This area has been itching.  Pain is under control overall.  No additional complaints or concerns.   ROS: Patient denies new vision changes,  vomiting, diarrhea,  shortness of breath or chest pain, headache, or mood change. + nausea- improved + Dizziness- improved + R leg swelling + rash- pruritic    Objective:   No results found.   Recent Labs    06/22/24 0508  WBC 3.3*  HGB 9.7*  HCT 30.1*  PLT 184    Recent Labs    06/22/24 0508  NA 141  K 4.6  CL 105  CO2 28  GLUCOSE 132*  BUN 10  CREATININE 0.71  CALCIUM 8.9     Intake/Output Summary (Last 24 hours) at 06/22/2024 1306 Last data filed at 06/22/2024 0739 Gross per 24 hour  Intake 808 ml  Output --  Net 808 ml        Physical Exam: Vital Signs Blood pressure 138/68, pulse 78, temperature 98.3 F (36.8 C), resp. rate 18, height 5' 4 (1.626 m), weight 84.1 kg, SpO2 99%.   General: No acute distress Mood and affect are appropriate Heart: Regular rate and rhythm no rubs murmurs or extra sounds Lungs: Clear to auscultation, breathing unlabored, no rales or wheezes Abdomen: Positive bowel sounds, soft nontender to palpation, nondistended Extremities: No clubbing, cyanosis, or edema Skin: No evidence of breakdown, rash with small erythematous papules on lower back  Neuro: Alert and oriented x 4, follows commands, no memory deficits noted, no speech or language deficits noted, cranial nerves II through XII grossly intact,   MMT: BUE 5/5. RLE 2/5 HF, KE d/t pain. 5/5 RADF/PF.  LLE 4 to 5/5 prox to distal. Sensory exam normal for light touch and pain in all 4 limbs.   No limb ataxia or cerebellar signs. No abnormal tone appreciated.    Prior neuro assessment is c/w today's exam 06/22/2024.   MSK:  Right knee swollen, appropriately tender, please see  image     Assessment/Plan: 1. Functional deficits which require 3+ hours per day of interdisciplinary therapy in a comprehensive inpatient rehab setting. Physiatrist is providing close team supervision and 24 hour management of active medical problems listed below. Physiatrist and rehab team continue to assess barriers to discharge/monitor patient progress toward functional and medical goals  Care Tool:  Bathing    Body parts bathed by patient: Right arm, Right lower leg, Left arm, Left upper leg, Chest, Left lower leg, Abdomen, Front perineal area, Buttocks, Right upper leg, Face     Body parts n/a: Buttocks, Right lower leg, Left lower leg   Bathing assist Assist Level: Moderate Assistance - Patient 50 - 74%     Upper Body Dressing/Undressing Upper body dressing   What is the patient wearing?: Pull over shirt    Upper body assist Assist Level: Set up assist    Lower Body Dressing/Undressing Lower body dressing      What is the patient wearing?: Pants, Underwear/pull up     Lower body assist Assist for lower body dressing: Moderate Assistance - Patient 50 - 74%  Toileting Toileting    Toileting assist Assist for toileting: Moderate Assistance - Patient 50 - 74%     Transfers Chair/bed transfer  Transfers assist     Chair/bed transfer assist level: Minimal Assistance - Patient > 75% (RW)     Locomotion Ambulation   Ambulation assist   Ambulation activity did not occur: Safety/medical concerns (dizziness/lightheadedness)  Assist level: Contact Guard/Touching assist Assistive device: Walker-rolling Max distance: 5 feet   Walk 10 feet activity   Assist  Walk 10 feet activity did not occur: Safety/medical concerns (fatigue)        Walk 50 feet activity   Assist Walk 50 feet with 2 turns activity did not occur: Safety/medical concerns (pain/fatigue/nausea)         Walk 150 feet activity   Assist Walk 150 feet activity did not occur:  Safety/medical concerns (pain/fatigue/nausea)         Walk 10 feet on uneven surface  activity   Assist Walk 10 feet on uneven surfaces activity did not occur: Safety/medical concerns (pain/fatigue/nausea)         Wheelchair     Assist        Wheelchair assist level: Supervision/Verbal cueing Max wheelchair distance: 150    Wheelchair 50 feet with 2 turns activity    Assist        Assist Level: Supervision/Verbal cueing   Wheelchair 150 feet activity     Assist      Assist Level: Supervision/Verbal cueing   Blood pressure 138/68, pulse 78, temperature 98.3 F (36.8 C), resp. rate 18, height 5' 4 (1.626 m), weight 84.1 kg, SpO2 99%.  Medical Problem List and Plan: 1. Functional deficits secondary to right proximal tibial fracture.  Status post ORIF 06/12/2024 per Dr. Kendal.               -TDWB RLE per Dr. Kendal op-note, NWB in later note.  Continue nonweightbearing for now and will check with Ortho - no ROM restriction or brace, will keep NWB for now             -ACE wrap for support of Right knee             -patient may  shower             -ELOS/Goals: 7-10 days, mod I goals  - Expected discharge 06/27/2019 plan  - Continue CIR  2.  Antithrombotics: -DVT/anticoagulation:  Pharmaceutical: Lovenox  40mg  [place] every day , Normal vascular study RLE.  Transition to aspirin  325 mg daily x 4 weeks outpatient on discharge             -antiplatelet therapy: N/A 3. Pain Management: Neurontin  400 mg 3 times daily, Robaxin  and oxycodone  as needed             -pain fairly well controlled, taking 60mg  oxy IR per day, may benefit from taper PTA    - 7/21 consider tapering oxy in the next couple days  4. Mood/Behavior/Sleep: Zoloft  100 mg daily             -antipsychotic agents:   -Pt uses PRN zanax at home, will start 0.5mg  BID PRN 5. Neuropsych/cognition: This patient is capable of making decisions on her own behalf. 6. Skin/Wound Care: Routine skin  checks 7. Fluids/Electrolytes/Nutrition: Routine in and outs with follow-up chemistries 8.  Acute blood loss anemia.  Follow-up CBC  -7/15 Hemoglobin a little lower at 10.0, continue to monitor trend  -7/21 hemoglobin down to 9.8,  will check occult stool 9.  Hypertension.  Cozaar  25 mg daily.  Monitor with increased mobility  - BP controlled continue to monitor  -Decreased cozaar  to 12.5 mg due to [facility], see #12  - 7/17 BP a little bit higher today, continue current regimen for now and monitor  -7/18 BP intermittently a little bit elevated, could consider going back up on Cozaar  if this continues.  Or could  consider mild diuretic see #13. will recheck orthostatic vital signs.  -7/21 BP stable continue current regimen    06/22/2024    6:09 AM 06/21/2024    8:00 PM 06/21/2024    2:10 PM  Vitals with BMI  Systolic 138 126 860  Diastolic 68 50 65  Pulse 78 82 83    10.  Diabetes mellitus with peripheral neuropathy.  Currently on SSI.  Patient on Glucophage 1000 mg twice daily prior to admission.  Resume as needed  - 7/16 patient requests regular diet, will change per her request  -7/21 CBGs well-controlled continue to monitor  CBG (last 3)  Recent Labs    06/21/24 2150 06/22/24 0607 06/22/24 1201  GLUCAP 123* 132* 107*     11. Mildly elevated AST  -7/21 improved  12.  Orthostatic hypotension  - Patient has compression stocking to left leg, add abdominal binder.  Will decrease Cozaar  to 12.5 mg  -04/18/24 improved today, continue oral fluid intake and abdominal binder/compression.  Continue current regimen for now and monitor  -5/18 will recheck orthostatic VS, overall symptoms have improved.  She reports occasional brief dizziness but this potentially may not be [place] related 13. RLE edema  vascular ultrasound negative for DVT , discussed leg elevation when in bed, could consider mild diuretic 14. Rash to back.  Possibly contact dermatitis.  Continue to monitor  - Will start  hydrocortisone  cream twice a day  LOS: 7 days A FACE TO FACE EVALUATION WAS PERFORMED  Murray Collier 06/22/2024, 1:06 PM

## 2024-06-23 ENCOUNTER — Inpatient Hospital Stay (HOSPITAL_COMMUNITY)

## 2024-06-23 DIAGNOSIS — I10 Essential (primary) hypertension: Secondary | ICD-10-CM | POA: Diagnosis not present

## 2024-06-23 DIAGNOSIS — L089 Local infection of the skin and subcutaneous tissue, unspecified: Secondary | ICD-10-CM | POA: Diagnosis not present

## 2024-06-23 DIAGNOSIS — E1142 Type 2 diabetes mellitus with diabetic polyneuropathy: Secondary | ICD-10-CM | POA: Diagnosis not present

## 2024-06-23 DIAGNOSIS — S82101D Unspecified fracture of upper end of right tibia, subsequent encounter for closed fracture with routine healing: Secondary | ICD-10-CM | POA: Diagnosis not present

## 2024-06-23 LAB — GLUCOSE, CAPILLARY
Glucose-Capillary: 108 mg/dL — ABNORMAL HIGH (ref 70–99)
Glucose-Capillary: 108 mg/dL — ABNORMAL HIGH (ref 70–99)
Glucose-Capillary: 159 mg/dL — ABNORMAL HIGH (ref 70–99)
Glucose-Capillary: 121 mg/dL — ABNORMAL HIGH (ref 70–99)

## 2024-06-23 MED ORDER — POLYETHYLENE GLYCOL 3350 17 G PO PACK: 17.0000 g | PACK | Freq: Every day | ORAL | Status: AC | PRN

## 2024-06-23 MED ORDER — CEPHALEXIN 250 MG PO CAPS
500.0000 mg | ORAL_CAPSULE | Freq: Three times a day (TID) | ORAL | Status: DC
  Administered 2024-06-23 – 2024-06-26 (×9): 500 mg via ORAL
  Filled 2024-06-23 (×9): qty 2

## 2024-06-23 MED ORDER — ACETAMINOPHEN 325 MG PO TABS: 325.0000 mg | ORAL_TABLET | ORAL | Status: AC | PRN

## 2024-06-23 MED ORDER — BENEPROTEIN PO POWD
1.0000 | Freq: Three times a day (TID) | ORAL | Status: DC
  Filled 2024-06-23: qty 227

## 2024-06-23 MED ORDER — JUVEN PO PACK
1.0000 | PACK | Freq: Two times a day (BID) | ORAL | Status: DC
  Administered 2024-06-23 – 2024-06-25 (×4): 1 via ORAL
  Filled 2024-06-23 (×3): qty 1

## 2024-06-23 MED ORDER — DOXYCYCLINE HYCLATE 100 MG PO TABS: 100.0000 mg | ORAL_TABLET | Freq: Two times a day (BID) | ORAL | Status: DC

## 2024-06-23 MED ORDER — DOCUSATE SODIUM 100 MG PO CAPS: 100.0000 mg | ORAL_CAPSULE | Freq: Two times a day (BID) | ORAL | Status: AC

## 2024-06-23 MED ORDER — ASPIRIN 325 MG PO TBEC
325.0000 mg | DELAYED_RELEASE_TABLET | Freq: Every day | ORAL | Status: AC
Start: 2024-06-23 — End: 2024-07-27

## 2024-06-23 NOTE — Progress Notes (Signed)
 Physical Therapy Session Note  Patient Details  Name: Shirley Sullivan MRN: [id number] Date of Birth: 30-Oct-1961  Today's Date: 06/23/2024 PT Individual Time: 0900-0945 PT Individual Time Calculation (min): 45 min   Short Term Goals: Week 1:  PT Short Term Goal 1 (Week 1): STG=LTG 2/2 ELOS  Skilled Therapeutic Interventions/Progress Updates: Patient seated in wc on entrance to room. Patient alert and agreeable to PT session.  Pt reported that her leg had been hurting more and that her leg had become very swollen over the night. The pt leg was visibly bruised at the lateral R ankle and red over the middle tibia. The pt was reported that her provider had been made aware. The foot and lower leg compression ace wrap was added to increase the amount of compression from distal to proximal per her MD orders.   Therapeutic Activity- Completed in parallel bars with minA and compliant with WB precautions. Pt needed vc to perform more depth on SL squats.  L SL partial squat - 4x10 L SL Calf Raise - 2x10 L SL balance on airex pad - 3x fatigue  Therapeutic Exercise  R Knee Extension - 3x20 R Ankle Circles - 3x20 R Resisted Ankle PF 3x20  Pt reported pain during ankle movements on the R and stopped performing the exercise when pain became too much.    Patient seated in wc at end of session with brakes locked, all needs within reach.      Therapy Documentation Precautions:  Precautions Precautions: Fall Recall of Precautions/Restrictions: Intact Precaution/Restrictions Comments: pt aware of precautions however poor implementation and poor decision making. Restrictions Weight Bearing Restrictions Per Provider Order: Yes RLE Weight Bearing Per Provider Order: Non weight bearing Other Position/Activity Restrictions: NWB RLE   Therapy/Group: Individual Therapy  Nicholis Stepanek 06/23/2024, 11:10 AM

## 2024-06-23 NOTE — Progress Notes (Signed)
 Physical Therapy Session Note  Patient Details  Name: Shirley Sullivan MRN: [id number] Date of Birth: Mar 18, 1961  Today's Date: 06/23/2024 PT Individual Time: 1002-1115, 1300-1320 PT Individual Time Calculation (min): 73 min, 20 min PT missed time: 25 min (pt fatigue, unwilling to participate 2/2 nausea  Short Term Goals: Week 1:  PT Short Term Goal 1 (Week 1): STG=LTG 2/2 ELOS  Skilled Therapeutic Interventions/Progress Updates:      Treatment Session 1  Pt seated in WC upon arrival. Pt agreeable to therpay. Pt reprots 5/10 pain R LE, pt reports redness and itching and edema last night but improved this morning. Pt wearing R LE ace wrap. Pt reports plan to do x-ray to day.   Pt requesting to keep session at [facility] level for improved tolerance today 2/2 pain/fatigue.   Pt self propelled WC 300+ feet on uneven terrain indoors and outside , on/off elevators, over thresholds, while navigating narrow spaces in gift shop with supervision. Verbal cues provided for safety with R LE.   Pt required min A for descending declined sidewalk 2/2 pt having difficulty controlling speed and veering to L, recommended pt wear tennis shoe as pt demonstrates improved control yesterday with navigating ramp. Pt required reminder for ascending backwards.   Pt demonstrates improved carry over with managing leg rests throughout session.   Pt performed stand pivot transfer WC to bed with supervision, pt required questioning cues for management of WC.   Pt supine in bed with all needs within reach and bed alarm on.   Treatment Session 2   Pt supine in bed upon arrival. Pt reports not feeling well. Pt endorses constipation, nausea (with episode of vomiting around noon), unable to eat lunch, 8/10 pain extending from Low back B and radiating to R leg. Vitals assessed: BP 148/70 HR 79 temp 98.4, Notified nurse and PA. Assisted with scooting pt up in bed to reduce back pain, and elevated R LE. Nurse and PA present at end of  session.   Pt missed 25 pain 2/2 fatigue/nausea/pain    Therapy Documentation Precautions:  Precautions Precautions: Fall Recall of Precautions/Restrictions: Intact Precaution/Restrictions Comments: pt aware of precautions however poor implementation and poor decision making. Restrictions Weight Bearing Restrictions Per Provider Order: Yes RLE Weight Bearing Per Provider Order: Non weight bearing Other Position/Activity Restrictions: NWB RLE  Therapy/Group: Individual Therapy  [facility] Doreene Orris, [place], DPT  06/23/2024, 10:56 AM

## 2024-06-23 NOTE — Progress Notes (Signed)
 Occupational Therapy Session Note  Patient Details  Name: Shirley Sullivan MRN: [id number] Date of Birth: 13-May-1961  Today's Date: 06/23/2024 OT Individual Time: 0800-0830 OT Individual Time Calculation (min): 30 min    Short Term Goals: Week 1:  OT Short Term Goal 1 (Week 1): STG=LTG due to ELOs  Skilled Therapeutic Interventions/Progress Updates:    Patient agreeable to participate in OT session. Reports 8/10 pain level, with medication administered during session. .   Patient participated in skilled OT session focusing on functional transfer training, body positioning, and UE strengthening. Patient able to complete transfer between bed, wc, and bsc to increase I in functional mobility. Patient able to complete UE strengthening in all planes 1x15. Patient left in room in wc all needs in reach.   Therapy Documentation Precautions:  Precautions Precautions: Fall Recall of Precautions/Restrictions: Intact Precaution/Restrictions Comments: pt aware of precautions however poor implementation and poor decision making. Restrictions Weight Bearing Restrictions Per Provider Order: Yes RLE Weight Bearing Per Provider Order: Non weight bearing Other Position/Activity Restrictions: NWB RLE   Therapy/Group: Individual Therapy  D'mariea L Jonica Bickhart 06/23/2024, 8:07 AM

## 2024-06-23 NOTE — Progress Notes (Addendum)
 PROGRESS NOTE   Subjective/Complaints:  Pt noted to have some continued swelling and has some blanchable redness distal R lower leg.  Possibly some worse pain in her leg, but patient also feels like this may be due to being a little bit off her medication schedule.   ROS: Patient denies new vision changes,  vomiting, diarrhea,  shortness of breath or chest pain, headache, or mood change. + nausea-intermittent + R leg swelling + rash- pruritic around her back    Objective:   No results found.   Recent Labs    06/22/24 0508  WBC 3.3*  HGB 9.7*  HCT 30.1*  PLT 184    Recent Labs    06/22/24 0508  NA 141  K 4.6  CL 105  CO2 28  GLUCOSE 132*  BUN 10  CREATININE 0.71  CALCIUM 8.9     Intake/Output Summary (Last 24 hours) at 06/23/2024 1001 Last data filed at 06/23/2024 0900 Gross per 24 hour  Intake 480 ml  Output --  Net 480 ml        Physical Exam: Vital Signs Blood pressure 134/70, pulse 88, temperature 98.5 F (36.9 C), temperature source Oral, resp. rate 18, height 5' 4 (1.626 m), weight 84.1 kg, SpO2 98%.   General: No acute distress Mood and affect are appropriate Heart: Regular rate and rhythm no rubs murmurs or extra sounds Lungs: Clear to auscultation, breathing unlabored, no rales or wheezes Abdomen: Positive bowel sounds, soft nontender to palpation, nondistended Extremities: No clubbing, cyanosis Right lower extremity edema 2+, ecchymosis on posterior lateral lower right leg.  Blanchable erythema noted on lateral right lower leg Skin: No evidence of breakdown, rash with small erythematous papules on lower back  Neuro: Alert and oriented x 4, follows commands, no memory deficits noted, no speech or language deficits noted, cranial nerves II through XII grossly intact,   MMT: BUE 5/5. RLE 2/5 HF, KE d/t pain. 5/5 RADF/PF.  LLE 4 to 5/5 prox to distal. Sensory exam normal for light touch  and pain in all 4 limbs.   No limb ataxia or cerebellar signs. No abnormal tone appreciated.    Prior neuro assessment is c/w today's exam 06/23/2024.   MSK:  Right knee swollen, appropriately tender     Assessment/Plan: 1. Functional deficits which require 3+ hours per day of interdisciplinary therapy in a comprehensive inpatient rehab setting. Physiatrist is providing close team supervision and 24 hour management of active medical problems listed below. Physiatrist and rehab team continue to assess barriers to discharge/monitor patient progress toward functional and medical goals  Care Tool:  Bathing    Body parts bathed by patient: Right arm, Right lower leg, Left arm, Left upper leg, Chest, Left lower leg, Abdomen, Front perineal area, Buttocks, Right upper leg, Face     Body parts n/a: Buttocks, Right lower leg, Left lower leg   Bathing assist Assist Level: Moderate Assistance - Patient 50 - 74%     Upper Body Dressing/Undressing Upper body dressing   What is the patient wearing?: Pull over shirt    Upper body assist Assist Level: Set up assist    Lower Body Dressing/Undressing Lower body  dressing      What is the patient wearing?: Pants, Underwear/pull up     Lower body assist Assist for lower body dressing: Moderate Assistance - Patient 50 - 74%     Toileting Toileting    Toileting assist Assist for toileting: Moderate Assistance - Patient 50 - 74%     Transfers Chair/bed transfer  Transfers assist     Chair/bed transfer assist level: Supervision/Verbal cueing     Locomotion Ambulation   Ambulation assist   Ambulation activity did not occur: Safety/medical concerns (dizziness/lightheadedness)  Assist level: Contact Guard/Touching assist Assistive device: Walker-rolling Max distance: 7 feet   Walk 10 feet activity   Assist  Walk 10 feet activity did not occur: Safety/medical concerns (fatigue)        Walk 50 feet  activity   Assist Walk 50 feet with 2 turns activity did not occur: Safety/medical concerns (pain/fatigue/nausea)         Walk 150 feet activity   Assist Walk 150 feet activity did not occur: Safety/medical concerns (pain/fatigue/nausea)         Walk 10 feet on uneven surface  activity   Assist Walk 10 feet on uneven surfaces activity did not occur: Safety/medical concerns (pain/fatigue/nausea)         Wheelchair     Assist        Wheelchair assist level: Supervision/Verbal cueing Max wheelchair distance: 150    Wheelchair 50 feet with 2 turns activity    Assist        Assist Level: Supervision/Verbal cueing   Wheelchair 150 feet activity     Assist      Assist Level: Supervision/Verbal cueing   Blood pressure 134/70, pulse 88, temperature 98.5 F (36.9 C), temperature source Oral, resp. rate 18, height 5' 4 (1.626 m), weight 84.1 kg, SpO2 98%.  Medical Problem List and Plan: 1. Functional deficits secondary to right proximal tibial fracture.  Status post ORIF 06/12/2024 per Dr. Kendal.               -TDWB RLE per Dr. Kendal op-note, NWB in later note.  Continue nonweightbearing for now and will check with Ortho - no ROM restriction or brace, will keep NWB for now             -ACE wrap for support of Right knee             -patient may  shower             -ELOS/Goals: 7-10 days, mod I goals  - Expected discharge 06/27/2019 plan  - Continue CIR  - Will recheck right knee x-ray as it has been almost 2 weeks since her surgery and potentially increased pain  --Pt with some blanchable erythema on RLE, Nausea with doxy in the past, will start kelfex for possible infection  2.  Antithrombotics: -DVT/anticoagulation:  Pharmaceutical: Lovenox  40mg  [place] every day , Normal vascular study RLE.  Transition to aspirin  325 mg daily x 4 weeks outpatient on discharge             -antiplatelet therapy: N/A 3. Pain Management: Neurontin  400 mg 3 times daily,  Robaxin  and oxycodone  as needed             -pain fairly well controlled, taking 60mg  oxy IR per day, may benefit from taper PTA    - 7/22 continue current regimen of pain medications for now  4. Mood/Behavior/Sleep: Zoloft  100 mg daily             -  antipsychotic agents:   -Pt uses PRN zanax at home, will start 0.5mg  BID PRN 5. Neuropsych/cognition: This patient is capable of making decisions on her own behalf. 6. Skin/Wound Care: Routine skin checks 7. Fluids/Electrolytes/Nutrition: Routine in and outs with follow-up chemistries 8.  Acute blood loss anemia.  Follow-up CBC  -7/15 Hemoglobin a little lower at 10.0, continue to monitor trend  -7/21 hemoglobin down to 9.8, will check occult stool-pending 9.  Hypertension.  Cozaar  25 mg daily.  Monitor with increased mobility  - BP controlled continue to monitor  -Decreased cozaar  to 12.5 mg due to [facility], see #12  - 7/17 BP a little bit higher today, continue current regimen for now and monitor  -7/18 BP intermittently a little bit elevated, could consider going back up on Cozaar  if this continues.  Or could  consider mild diuretic see #13. will recheck orthostatic vital signs.  -7/21-22 BP stable continue current regimen    06/22/2024    7:49 PM 06/22/2024    3:22 PM 06/22/2024    6:09 AM  Vitals with BMI  Systolic 134 140 861  Diastolic 70 82 68  Pulse 88 98 78    10.  Diabetes mellitus with peripheral neuropathy.  Currently on SSI.  Patient on Glucophage 1000 mg twice daily prior to admission.  Resume as needed  - 7/16 patient requests regular diet, will change per her request  -7/21 CBGs well-controlled continue to monitor  CBG (last 3)  Recent Labs    06/22/24 1625 06/22/24 2120 06/23/24 0615  GLUCAP 86 140* 108*     11. Mildly elevated AST  -7/21 improved  12.  Orthostatic hypotension  - Patient has compression stocking to left leg, add abdominal binder.  Will decrease Cozaar  to 12.5 mg  -04/18/24 improved today, continue  oral fluid intake and abdominal binder/compression.  Continue current regimen for now and monitor  -5/18 will recheck orthostatic VS, overall symptoms have improved.  She reports occasional brief dizziness but this potentially may not be [place] related 13. RLE edema  vascular ultrasound negative for DVT , discussed leg elevation when in bed, could consider mild diuretic  -Will start ace wrap, repeat xray   14. Rash to back.  Possibly contact dermatitis.  Continue to monitor  - Will start hydrocortisone  cream twice a day   LOS: 8 days A FACE TO FACE EVALUATION WAS PERFORMED  Murray Collier 06/23/2024, 10:01 AM

## 2024-06-24 DIAGNOSIS — I10 Essential (primary) hypertension: Secondary | ICD-10-CM | POA: Diagnosis not present

## 2024-06-24 DIAGNOSIS — S82101D Unspecified fracture of upper end of right tibia, subsequent encounter for closed fracture with routine healing: Secondary | ICD-10-CM | POA: Diagnosis not present

## 2024-06-24 DIAGNOSIS — R110 Nausea: Secondary | ICD-10-CM

## 2024-06-24 DIAGNOSIS — E1142 Type 2 diabetes mellitus with diabetic polyneuropathy: Secondary | ICD-10-CM | POA: Diagnosis not present

## 2024-06-24 DIAGNOSIS — L089 Local infection of the skin and subcutaneous tissue, unspecified: Secondary | ICD-10-CM | POA: Diagnosis not present

## 2024-06-24 LAB — GLUCOSE, CAPILLARY
Glucose-Capillary: 121 mg/dL — ABNORMAL HIGH (ref 70–99)
Glucose-Capillary: 110 mg/dL — ABNORMAL HIGH (ref 70–99)
Glucose-Capillary: 104 mg/dL — ABNORMAL HIGH (ref 70–99)
Glucose-Capillary: 141 mg/dL — ABNORMAL HIGH (ref 70–99)

## 2024-06-24 MED ORDER — LIDOCAINE 5 % EX PTCH
1.0000 | MEDICATED_PATCH | CUTANEOUS | Status: DC
  Filled 2024-06-24: qty 1

## 2024-06-24 MED ORDER — OXYCODONE HCL 5 MG PO TABS
10.0000 mg | ORAL_TABLET | ORAL | Status: DC | PRN
  Administered 2024-06-25 (×2): 10 mg via ORAL
  Filled 2024-06-24 (×2): qty 2

## 2024-06-24 MED ORDER — OXYCODONE HCL 5 MG PO TABS: 5.0000 mg | ORAL_TABLET | ORAL | Status: DC | PRN

## 2024-06-24 NOTE — Progress Notes (Signed)
 Orthopaedic Trauma Progress Note  SUBJECTIVE: Doing fairly well today, currently eating her lunch. Had some issues with swelling through the right leg but this has improved. Ace wraps helping. States therapies are going well. No chest pain. No SOB. No nausea/vomiting. No other complaints. Scheduled for d/c home on 06/26/24  OBJECTIVE:  Vitals:   06/23/24 2022 06/24/24 0453  BP: 105/86 (!) 141/74  Pulse: 90 95  Resp: 15 15  Temp: 98.7 F (37.1 C) 98.4 F (36.9 C)  SpO2: 97% 99%    Opiates Today (MME): Today's  total administered Morphine  Milligram Equivalents: 0 Opiates Yesterday (MME): Yesterday's total administered Morphine  Milligram Equivalents: 67.5  General: Sitting up on EOB eating lunch. NAD. Pleasant and cooperative Respiratory: No increased work of breathing.  Operative Extremity (RLE): Ace wraps re-applied for patient's comfort. Incisions CDI  IMAGING: Stable post op imaging.   LABS:  Results for orders placed or performed during the hospital encounter of 06/15/24 (from the past 24 hours)  Glucose, capillary     Status: Abnormal   Collection Time: 06/23/24  4:54 PM  Result Value Ref Range   Glucose-Capillary 159 (H) 70 - 99 mg/dL  Glucose, capillary     Status: Abnormal   Collection Time: 06/23/24  8:54 PM  Result Value Ref Range   Glucose-Capillary 121 (H) 70 - 99 mg/dL   Comment 1 Notify RN   Glucose, capillary     Status: Abnormal   Collection Time: 06/24/24  6:26 AM  Result Value Ref Range   Glucose-Capillary 121 (H) 70 - 99 mg/dL  Glucose, capillary     Status: Abnormal   Collection Time: 06/24/24 11:39 AM  Result Value Ref Range   Glucose-Capillary 110 (H) 70 - 99 mg/dL    ASSESSMENT: Shirley Sullivan is a 63 y.o. female s/p fall Procedures: OPEN REDUCTION INTERNAL FIXATION RIGHT TIBIAL PLATEAU on 06/12/24  CV/Blood loss: Acute blood loss anemia, Hgb 9.7 on 06/22/24. Hemodynamically stable  PLAN: Weightbearing: NWB RLE ROM:  Ok for knee ROM as tolerated   Incisional and dressing care: Ok for incisions to be left open to air Showering:  incisions may get wet Orthopedic device(s): None  Pain management: continue current regimen VTE prophylaxis: Lovenox , SCDs Impediments to Fracture Healing: Vit D level 8, continue supplementation Dispo: Continue care per CIR. Recommend continuing Vit D 50,000 units q 7 days x 5 weeks at discharge. Recommend ASA 325 mg daily x 30 days for DVT ppx   Follow - up plan: 2 weeks after d/c for wound check and repeat x-rays   Contact information:  Franky Light MD, Lauraine Moores PA-C. After hours and holidays please check [email or web address] for group call information for [facility]   Lauraine PATRIC Moores, PA-C [phone] (office) [email or web address]

## 2024-06-24 NOTE — Progress Notes (Signed)
 PROGRESS NOTE   Subjective/Complaints:  Pt reports R leg swelling improved today. Rash on her back is also improved. Pt had back pain and nausea yesterday. This is resolved today.  LBM yesterday and today   ROS: Patient denies new vision changes,  vomiting, diarrhea,  shortness of breath or chest pain, headache, or mood change. + nausea-intermittent + R leg swelling- improved  + rash- pruritic around her back- improved    Objective:   DG Tibia/Fibula Right Result Date: 06/23/2024 CLINICAL DATA:  Lower extremity pain EXAM: RIGHT TIBIA AND FIBULA - 2 VIEW COMPARISON:  06/12/2024 FINDINGS: Plate fixation tibial plateau fracture. Fracture of proximal fibula again noted. No distal fracture evident. IMPRESSION: 1. No acute fracture identified. 2. Proximal fibular fracture noted. 3. Stable internal fixation of the tibial plateau fracture. Electronically Signed   By: Jackquline Boxer M.D.   On: 06/23/2024 16:00     Recent Labs    06/22/24 0508  WBC 3.3*  HGB 9.7*  HCT 30.1*  PLT 184    Recent Labs    06/22/24 0508  NA 141  K 4.6  CL 105  CO2 28  GLUCOSE 132*  BUN 10  CREATININE 0.71  CALCIUM 8.9     Intake/Output Summary (Last 24 hours) at 06/24/2024 1154 Last data filed at 06/23/2024 1845 Gross per 24 hour  Intake 360 ml  Output --  Net 360 ml        Physical Exam: Vital Signs Blood pressure (!) 141/74, pulse 95, temperature 98.4 F (36.9 C), resp. rate 15, height 5' 4 (1.626 m), weight 84.1 kg, SpO2 99%.   General: No acute distress Mood and affect are appropriate Heart: Regular rate and rhythm no rubs murmurs or extra sounds Lungs: Clear to auscultation, breathing unlabored, no rales or wheezes Abdomen: Positive bowel sounds, soft nontender to palpation, nondistended Extremities: No clubbing, cyanosis Right lower extremity edema 1+, ecchymosis on posterior lateral lower right leg.  Blanchable  erythema noted on lateral right lower leg Ace wrap on RLE Skin: No evidence of breakdown, rash with small erythematous papules on lower back- improved   Neuro: Alert and oriented x 4, follows commands, no memory deficits noted, no speech or language deficits noted, cranial nerves II through XII grossly intact,   MMT: BUE 5/5. RLE 2/5 HF, KE d/t pain. 5/5 RADF/PF.  LLE 4 to 5/5 prox to distal. Sensory exam normal for light touch and pain in all 4 limbs.   No limb ataxia or cerebellar signs. No abnormal tone appreciated.    Prior neuro assessment is c/w today's exam 06/24/2024.   MSK:  Right knee swollen, appropriately tender     Assessment/Plan: 1. Functional deficits which require 3+ hours per day of interdisciplinary therapy in a comprehensive inpatient rehab setting. Physiatrist is providing close team supervision and 24 hour management of active medical problems listed below. Physiatrist and rehab team continue to assess barriers to discharge/monitor patient progress toward functional and medical goals  Care Tool:  Bathing    Body parts bathed by patient: Right arm, Right lower leg, Left arm, Left upper leg, Chest, Left lower leg, Abdomen, Front perineal area, Buttocks, Right upper leg,  Face     Body parts n/a: Buttocks, Right lower leg, Left lower leg   Bathing assist Assist Level: Moderate Assistance - Patient 50 - 74%     Upper Body Dressing/Undressing Upper body dressing   What is the patient wearing?: Pull over shirt    Upper body assist Assist Level: Set up assist    Lower Body Dressing/Undressing Lower body dressing      What is the patient wearing?: Pants, Underwear/pull up     Lower body assist Assist for lower body dressing: Moderate Assistance - Patient 50 - 74%     Toileting Toileting    Toileting assist Assist for toileting: Moderate Assistance - Patient 50 - 74%     Transfers Chair/bed transfer  Transfers assist     Chair/bed transfer  assist level: Supervision/Verbal cueing     Locomotion Ambulation   Ambulation assist   Ambulation activity did not occur: Safety/medical concerns (dizziness/lightheadedness)  Assist level: Contact Guard/Touching assist Assistive device: Walker-rolling Max distance: 7 feet   Walk 10 feet activity   Assist  Walk 10 feet activity did not occur: Safety/medical concerns (fatigue)        Walk 50 feet activity   Assist Walk 50 feet with 2 turns activity did not occur: Safety/medical concerns (pain/fatigue/nausea)         Walk 150 feet activity   Assist Walk 150 feet activity did not occur: Safety/medical concerns (pain/fatigue/nausea)         Walk 10 feet on uneven surface  activity   Assist Walk 10 feet on uneven surfaces activity did not occur: Safety/medical concerns (pain/fatigue/nausea)         Wheelchair     Assist        Wheelchair assist level: Supervision/Verbal cueing Max wheelchair distance: 150    Wheelchair 50 feet with 2 turns activity    Assist        Assist Level: Supervision/Verbal cueing   Wheelchair 150 feet activity     Assist      Assist Level: Supervision/Verbal cueing   Blood pressure (!) 141/74, pulse 95, temperature 98.4 F (36.9 C), resp. rate 15, height 5' 4 (1.626 m), weight 84.1 kg, SpO2 99%.  Medical Problem List and Plan: 1. Functional deficits secondary to right proximal tibial fracture.  Status post ORIF 06/12/2024 per Dr. Kendal.               -TDWB RLE per Dr. Kendal op-note, NWB in later note.  Continue nonweightbearing for now and will check with Ortho - no ROM restriction or brace, will keep NWB for now             -ACE wrap for support of Right knee             -patient may  shower             -ELOS/Goals: 7-10 days, mod I goals  - Expected discharge 06/27/2019 plan  - Continue CIR  - R tib fib xray stable- reviewed with PT  --Pt with some blanchable erythema on RLE, Nausea with doxy in  the past, will start kelfex for possible infection  -Team conference today please see physician documentation under team conference tab, met with team  to discuss problems,progress, and goals. Formulized individual treatment plan based on medical history, underlying problem and comorbidities.    2.  Antithrombotics: -DVT/anticoagulation:  Pharmaceutical: Lovenox  40mg  [place] every day , Normal vascular study RLE.  Transition to aspirin  325 mg  daily x 4 weeks outpatient on discharge             -antiplatelet therapy: N/A 3. Pain Management: Neurontin  400 mg 3 times daily, Robaxin  and oxycodone  as needed             -pain fairly well controlled, taking 60mg  oxy IR per day, may benefit from taper PTA    - 7/22 continue current regimen of pain medications for now 4. Mood/Behavior/Sleep: Zoloft  100 mg daily             -antipsychotic agents:   -Pt uses PRN zanax at home, will start 0.5mg  BID PRN 5. Neuropsych/cognition: This patient is capable of making decisions on her own behalf. 6. Skin/Wound Care: Routine skin checks 7. Fluids/Electrolytes/Nutrition: Routine in and outs with follow-up chemistries 8.  Acute blood loss anemia.  Follow-up CBC  -7/15 Hemoglobin a little lower at 10.0, continue to monitor trend  -7/21 hemoglobin down to 9.8, will check occult stool-pending  -Will discuss with nursing 9.  Hypertension.  Cozaar  25 mg daily.  Monitor with increased mobility  - BP controlled continue to monitor  -Decreased cozaar  to 12.5 mg due to [facility], see #12  - 7/17 BP a little bit higher today, continue current regimen for now and monitor  -7/18 BP intermittently a little bit elevated, could consider going back up on Cozaar  if this continues.  Or could  consider mild diuretic see #13. will recheck orthostatic vital signs.  -7/21-23 BP stable continue current regimen    06/24/2024    4:53 AM 06/23/2024    8:22 PM 06/23/2024    1:47 PM  Vitals with BMI  Systolic 141 105 856  Diastolic 74 86 76  Pulse  95 90 80    10.  Diabetes mellitus with peripheral neuropathy.  Currently on SSI.  Patient on Glucophage 1000 mg twice daily prior to admission.  Resume as needed  - 7/16 patient requests regular diet, will change per her request  -7/23 remains well controlled   CBG (last 3)  Recent Labs    06/23/24 2054 06/24/24 0626 06/24/24 1139  GLUCAP 121* 121* 110*     11. Mildly elevated AST  -7/21 improved  12.  Orthostatic hypotension  - Patient has compression stocking to left leg, add abdominal binder.  Will decrease Cozaar  to 12.5 mg  -04/18/24 improved today, continue oral fluid intake and abdominal binder/compression.  Continue current regimen for now and monitor  -5/18 will recheck orthostatic VS, overall symptoms have improved.  She reports occasional brief dizziness but this potentially may not be [place] related 13. RLE edema  vascular ultrasound negative for DVT , discussed leg elevation when in bed, could consider mild diuretic  -Will start ace wrap, repeat xray- stable   14. Rash to back.  Possibly contact dermatitis.  Continue to monitor  - Will start hydrocortisone  cream twice a day  -Improving continue to monitor    LOS: 9 days A FACE TO FACE EVALUATION WAS PERFORMED  Murray Collier 06/24/2024, 11:54 AM

## 2024-06-24 NOTE — Progress Notes (Signed)
 Occupational Therapy Session Note  Patient Details  Name: Shirley Sullivan MRN: [id number] Date of Birth: 04/04/1961  Today's Date: 06/24/2024 OT Individual Time: 9199-9085 OT Individual Time Calculation (min): 74 min    Short Term Goals: Week 2:  OT Short Term Goal 1 (Week 1): STG=LTG due to ELOs  Skilled Therapeutic Interventions/Progress Updates:    Patient agreeable to participate in OT session. Reports 6/10 pain level, reported to RN for medication administration. .   Patient participated in skilled OT session focusing on standing tolerance, wc mobility, functional activity tolerance. Patient completed wc transfer mod I. Patient able to complete wc mobility for 300 ft with increased verbal cues pushing evenly however able to self correct. Patient completed dynamic standing activities able to stand within precautions for 3-4 minutes with RW while maintaining precautions to increase standing tolerance and ability to transfer. Patient able to complete NuStep to increase functional activity tolerance. Patient transferred to wc with RW with increased skill and balance. Completed wc mobility with increased cues for direction due to increased right turning with running into objects. Patient tested on proprioception/ depth perception which was intact. Patient able to complete 300 + ft with wc propelling for mobility and UB strengthening. Patient returned to room all needs in reach.    Therapy Documentation Precautions:  Precautions Precautions: Fall Recall of Precautions/Restrictions: Intact Precaution/Restrictions Comments: pt aware of precautions however poor implementation and poor decision making. Restrictions Weight Bearing Restrictions Per Provider Order: Yes RLE Weight Bearing Per Provider Order: Non weight bearing Other Position/Activity Restrictions: NWB RLE  Therapy/Group: Individual Therapy  D'mariea L Megin Consalvo 06/24/2024, 7:44 AM

## 2024-06-24 NOTE — Patient Care Conference (Signed)
 Inpatient RehabilitationTeam Conference and Plan of Care Update Date: 06/24/2024   Time: 1232 pm    Patient Name: Shirley Sullivan      Medical Record Number: [id number]  Date of Birth: November 07, 1961 Sex: Female         Room/Bed: 4W08C/4W08C-01 Payor Info: Payor: MEDICARE / Plan: MEDICARE PART A AND B / Product Type: *No Product type* /    Admit Date/Time:  06/15/2024 12:44 PM  Primary Diagnosis:  Right tibial fracture  Hospital Problems: Principal Problem:   Right tibial fracture Active Problems:   Depression with anxiety    Expected Discharge Date: Expected Discharge Date: 06/26/24  Team Members Present: Physician leading conference: Dr. Murray Collier Social Worker Present: Other (comment) Versa Ronde RN) Nurse Present: Eulalio Falls, RN PT Present: Doreene Orris, PT OT Present: Other (comment) ANCEL Seip, OT)     Current Status/Progress Goal Weekly Team Focus  Bowel/Bladder   Pt continent of b/b   Remain continent   Assess qshift and prn    Swallow/Nutrition/ Hydration               ADL's   mod I BSC, CG for increased sitance, set up ADLs   mod I to sup   standing tolerance, standing, transfer training, ADLs.    Mobility   bed mobility mod I, sit to stand, stand pivot transfer supervision/mod I, gait x10 feet with RW and CGA with hop to gait with WC in tow. WC mobility mod I, pt requires sueprvision for management of WC parts.   sup/mod I  D/C 7/25, follow up HHPT, DME needs: 18x18 WC, pt has RW, pt family has installed ramp onto porch however has high threshold entrance from porch to door, will provide link for threshold ramp. Non emergency medical transport scheduled friday 7/25 at 10:00 Pt husband unable to attend official family training, but attended for 10 min for car transfer trainging for follow up appoinments, and management of WC parts.    Communication                Safety/Cognition/ Behavioral Observations               Pain   Pt c/o L  leg pain 6/10 pain score, prn meds given   <3 pain score   Assess qshift and prn    Skin   Incision to R lower leg, steri strips, generalized bruising. Rash on lower back   Maintain skin integrity  Assess qshift and prn      Discharge Planning:  Home with husband (retired) . Ramp for entry to home (2 ste) installed. [facility] follow up order for PT/OT and DME ordered via [employer].    Team Discussion: Patient post right tibial plateau fracture with ORIF; restricted ROM of right ankle. Patient with history of Lupus.; baseline nausea controlled with Zofran . Rash with small erythematous papules on lower back- improved. Patient limited by pain, weight bearing restrictions and RLE edema.     Patient on target to meet rehab goals: yes, currently needs  requires mod I assistance with all ADLs, transfers and mobility using a wheelchair.. Patient requires supervision for management of wheelchair parts. Overall goals at discharge are set for mod I/supervision assistance    *See Care Plan and progress notes for long and short-term goals.   Revisions to Treatment Plan:  Hemoccult stool ACE wrap RLE  Teaching Needs: Safety, medications, transfers, toileting, ASA 325mg  daily x 4 weeks outpatient , etc   Current  Barriers to Discharge: Decreased caregiver support, Home enviroment access/layout, and Weight bearing restrictions  Possible Resolutions to Barriers: Family education HH follow up services DME: wheelchair Non emergency transport    Medical Summary Current Status: Right proximal Tib fracture, HTN, ABLA, HTN, DM2, skin infection, nausea, RLE edema, anxiety  Barriers to Discharge: Medical stability;Behavior/Mood;Uncontrolled Pain  Barriers to Discharge Comments: Right proximal Tib fracture, HTN, ABLA, HTN, DM2, skin infection, nausea, RLE edema, anxiety Possible Resolutions to [employer] Focus: oral abx, monitor CBG, Monito BP, check stool blood test, wrap ace for  RLE   Continued Need for Acute Rehabilitation Level of Care: The patient requires daily medical management by a physician with specialized training in physical medicine and rehabilitation for the following reasons: Direction of a multidisciplinary physical rehabilitation program to maximize functional independence : Yes Medical management of patient stability for increased activity during participation in an intensive rehabilitation regime.: Yes Analysis of laboratory values and/or radiology reports with any subsequent need for medication adjustment and/or medical intervention. : Yes   I attest that I was present, lead the team conference, and concur with the assessment and plan of the team.   Coady Train Gayo 06/24/2024, 1232 pm

## 2024-06-24 NOTE — Progress Notes (Signed)
 Physical Therapy Session Note  Patient Details  Name: Shirley Sullivan MRN: [id number] Date of Birth: [birth year]  Today's Date: 06/24/2024 PT Individual Time: 1000-1103 PT Individual Time Calculation (min): 63 min   Short Term Goals: Week 1:  PT Short Term Goal 1 (Week 1): STG=LTG 2/2 ELOS  Skilled Therapeutic Interventions/Progress Updates:      Pt supine in bed upon arrival. Pt agreeable to therapy. Pt reports denies any pain.   Pt reports L LE feeling much better today. X-rays negative for any acute fractures. MD present during AM rounds to discuss imaging results. Education provided regarding edema management with emphasis on elevating R LE when at rest.   Pt provided pictures of ramp installed for access to porch, however pt has theshold step entry from porch to front door. Pt planning to purchase threshold ramp for entry to front door via front front porch. Handout provided.   Recommended pt have family member assist with navigating WC up/down both ramps initially as the ramp appears to be slightly steeper. Pt and pt husband verbalized understanding and agreeable.   Pt expressing interest in having non emergency medical transport and reports haivng previous discussion with nurse care coordinator regarding this 2/2 concerns with caregiver availability initially upon discharge.  Notified care coordinator and Child psychotherapist. Care coordinator confirms she has arranged for pt to be picked up at 1000 7/25.   Pt husband present towards end of session. Pt husband unable to attend official family training prior to discharge. However provided family training on CGA needed for car transfer with RW for follow up appoinements and supervision needed for stand pivot transfer, for management of WC. Education provided for how to collapse RW and WC for transport. Pt husband returned demonstration.   Pt performed stand pivot  transfer x2 with supervision with RW. Recommending CGA at home as pt reports  surface near where pt parks her car is uneven.   Gait x10 feet with RW and CGA with hop to gait   Pt seated in WC at end of session with all needs within reahc and bed alarm on.    Therapy Documentation Precautions:  Precautions Precautions: Fall Recall of Precautions/Restrictions: Intact Precaution/Restrictions Comments: pt aware of precautions however poor implementation and poor decision making. Restrictions Weight Bearing Restrictions Per Provider Order: Yes RLE Weight Bearing Per Provider Order: Non weight bearing Other Position/Activity Restrictions: NWB RLE   Therapy/Group: Individual Therapy  [facility] [place], [place], DPT  06/24/2024, 10:04 AM

## 2024-06-24 NOTE — Progress Notes (Signed)
 Physical Therapy Session Note  Patient Details  Name: Shirley Sullivan MRN: [id number] Date of Birth: 12-Feb-1961  Today's Date: 06/24/2024 PT Individual Time: 8695-8585 PT Individual Time Calculation (min): 70 min   Short Term Goals: Week 1:  PT Short Term Goal 1 (Week 1): STG=LTG 2/2 ELOS   Skilled Therapeutic Interventions/Progress Updates:  Patient seated on EOB on entrance to room. Patient alert and agreeable to PT session after she can complete more of her lunch. While eating pt relates that PA from surgeon's office came to check healing of surgical site and make recommendations for f/u care. Pt is to visit office in 2 weeks following d/c from I[facility] for continued monitoring and to determine progression in WB status. Current plan to continue NWB for 6-8 weeks following surgery.   Patient with no pain complaint at start of session.  Pt also relates understanding that nurses are busy and need to attend to others who may be suffering more then she is. Relates not relating current pain levels to nursing - including previous evening - despite being in pain and requiring medication. Education provided to pt that she needs true rest overnight to heal and needs to advocate for self, especially if nursing in coming in check on her and asking directly. Rest is also necessary in order to better participate in therapy and other events throughout each day. Once home, will need to continue to advocate for self in order to better handle family matters that will come up there that pt has been shielded from while in hospital. Relates understanding but with some hesitancy.   Pt is fatigued from long day and sore from dressing change. Requests no standing during therapy.   Therapeutic Activity: Bed Mobility: Pt performed supine <> sit with Mod I. No cueing required.  Transfers: Pt performed sit<>stand and stand pivot transfers throughout session with supervision. No cueing provided for technique.   Wheelchair  Mobility:  Pt guided in w/c education re: pivot point of chair and focus needed to all aspects of chair in environment including position of LE on leg rests in order to propel throughout tighter spaces within home. Is able to propel w/c from room into day room and back with supervision/ ModI. Guided in maneuvering throughout slalom course with 6 cones placed 5 ft apart, then 4 ft apart. Pt improves with understanding of pivot point of w/c but struggles with maintaining close proximity to cones without running over them. Requires vc throughout for awareness of chair position to cone as well as position of legrests/ feet. Guided in backward negotiation of slalom course with max cues initally and improving quickly to require minimal cues to complete.  Is able to maneuver w/c by backing into 5'x5' space as well as 4'x4' space with minimal cueing required. Pt will need add'l training in order to negotiate tighter spaces within home.   Patient seated upright in w/c at end of session with brakes locked, belt alarm set, and all needs within reach.   Therapy Documentation Precautions:  Precautions Precautions: Fall Recall of Precautions/Restrictions: Intact Precaution/Restrictions Comments: pt aware of precautions however poor implementation and poor decision making. Restrictions Weight Bearing Restrictions Per Provider Order: Yes RLE Weight Bearing Per Provider Order: Non weight bearing Other Position/Activity Restrictions: NWB RLE  Pain: No pain related throughout session.   Therapy/Group: Individual Therapy  Mliss DELENA Milliner PT, DPT, CSRS 06/24/2024, 2:15 PM

## 2024-06-25 ENCOUNTER — Other Ambulatory Visit (HOSPITAL_COMMUNITY): Payer: Self-pay

## 2024-06-25 ENCOUNTER — Telehealth (HOSPITAL_COMMUNITY): Payer: Self-pay | Admitting: Pharmacy Technician

## 2024-06-25 DIAGNOSIS — S82101D Unspecified fracture of upper end of right tibia, subsequent encounter for closed fracture with routine healing: Secondary | ICD-10-CM | POA: Diagnosis not present

## 2024-06-25 DIAGNOSIS — R600 Localized edema: Secondary | ICD-10-CM | POA: Diagnosis not present

## 2024-06-25 DIAGNOSIS — I10 Essential (primary) hypertension: Secondary | ICD-10-CM | POA: Diagnosis not present

## 2024-06-25 DIAGNOSIS — E1142 Type 2 diabetes mellitus with diabetic polyneuropathy: Secondary | ICD-10-CM | POA: Diagnosis not present

## 2024-06-25 LAB — CBC
HCT: 31.2 % — ABNORMAL LOW (ref 36.0–46.0)
Hemoglobin: 10.2 g/dL — ABNORMAL LOW (ref 12.0–15.0)
MCH: 28.9 pg (ref 26.0–34.0)
MCHC: 32.7 g/dL (ref 30.0–36.0)
MCV: 88.4 fL (ref 80.0–100.0)
Platelets: 234 K/uL (ref 150–400)
RBC: 3.53 MIL/uL — ABNORMAL LOW (ref 3.87–5.11)
RDW: 14.6 % (ref 11.5–15.5)
WBC: 3.6 K/uL — ABNORMAL LOW (ref 4.0–10.5)
nRBC: 0 % (ref 0.0–0.2)

## 2024-06-25 LAB — BASIC METABOLIC PANEL WITH GFR
Anion gap: 8 (ref 5–15)
BUN: 15 mg/dL (ref 8–23)
CO2: 27 mmol/L (ref 22–32)
Calcium: 9.1 mg/dL (ref 8.9–10.3)
Chloride: 103 mmol/L (ref 98–111)
Creatinine, Ser: 0.61 mg/dL (ref 0.44–1.00)
GFR, Estimated: >60 mL/min (ref >60)
Glucose, Bld: 122 mg/dL — ABNORMAL HIGH (ref 70–99)
Potassium: 3.6 mmol/L (ref 3.5–5.1)
Sodium: 138 mmol/L (ref 135–145)

## 2024-06-25 LAB — GLUCOSE, CAPILLARY
Glucose-Capillary: 117 mg/dL — ABNORMAL HIGH (ref 70–99)
Glucose-Capillary: 128 mg/dL — ABNORMAL HIGH (ref 70–99)
Glucose-Capillary: 106 mg/dL — ABNORMAL HIGH (ref 70–99)
Glucose-Capillary: 152 mg/dL — ABNORMAL HIGH (ref 70–99)

## 2024-06-25 LAB — OCCULT BLOOD X 1 CARD TO LAB, STOOL: Fecal Occult Bld: NEGATIVE

## 2024-06-25 MED ORDER — SERTRALINE HCL 100 MG PO TABS
100.0000 mg | ORAL_TABLET | Freq: Every day | ORAL | 0 refills | Status: AC
  Filled 2024-06-25: qty 30, 30d supply, fill #0

## 2024-06-25 MED ORDER — ALPRAZOLAM 0.5 MG PO TABS
0.5000 mg | ORAL_TABLET | Freq: Two times a day (BID) | ORAL | 0 refills | Status: AC | PRN
  Filled 2024-06-25: qty 20, 10d supply, fill #0

## 2024-06-25 MED ORDER — LOSARTAN POTASSIUM 25 MG PO TABS
12.5000 mg | ORAL_TABLET | Freq: Every day | ORAL | 0 refills | Status: AC
  Filled 2024-06-25: qty 30, 60d supply, fill #0

## 2024-06-25 MED ORDER — OXYCODONE HCL 5 MG PO TABS
5.0000 mg | ORAL_TABLET | ORAL | 0 refills | Status: AC | PRN
  Filled 2024-06-25: qty 30, 5d supply, fill #0

## 2024-06-25 MED ORDER — METHOCARBAMOL 500 MG PO TABS
500.0000 mg | ORAL_TABLET | Freq: Four times a day (QID) | ORAL | 0 refills | Status: AC | PRN
  Filled 2024-06-25: qty 60, 15d supply, fill #0

## 2024-06-25 MED ORDER — ONDANSETRON 4 MG PO TBDP
4.0000 mg | ORAL_TABLET | Freq: Three times a day (TID) | ORAL | 0 refills | Status: AC | PRN
  Filled 2024-06-25: qty 20, 7d supply, fill #0

## 2024-06-25 MED ORDER — CEPHALEXIN 500 MG PO CAPS
500.0000 mg | ORAL_CAPSULE | Freq: Three times a day (TID) | ORAL | 0 refills | Status: AC
  Filled 2024-06-25: qty 12, 4d supply, fill #0

## 2024-06-25 MED ORDER — VITAMIN D (ERGOCALCIFEROL) 1.25 MG (50000 UNIT) PO CAPS
50000.0000 [IU] | ORAL_CAPSULE | ORAL | 0 refills | Status: AC
  Filled 2024-06-25: qty 5, 35d supply, fill #0

## 2024-06-25 MED ORDER — CETIRIZINE HCL 10 MG PO TABS
10.0000 mg | ORAL_TABLET | Freq: Every day | ORAL | 0 refills | Status: AC
  Filled 2024-06-25: qty 30, 30d supply, fill #0

## 2024-06-25 MED ORDER — GABAPENTIN 400 MG PO CAPS
ORAL_CAPSULE | ORAL | 0 refills | Status: AC
  Filled 2024-06-25: qty 90, 23d supply, fill #0

## 2024-06-25 MED ORDER — LIDOCAINE 5 % EX PTCH
1.0000 | MEDICATED_PATCH | CUTANEOUS | 0 refills | Status: AC
  Filled 2024-06-25: qty 30, 30d supply, fill #0

## 2024-06-25 NOTE — Telephone Encounter (Signed)
 Pharmacy Patient Advocate Encounter  Received notification from [employer] that Prior Authorization for Lidocaine  5% patches has been APPROVED from 06/25/2024 to 06/25/2025   PA #/Case ID/Reference #: [id number] Key: [id number]

## 2024-06-25 NOTE — Progress Notes (Signed)
 Patient ID: Shirley Sullivan, female   DOB: March 05, 1961, 63 y.o.   MRN: [id number] Follow up with the patient regarding team conference updates; plan for discharge home Friday 06/26/24. Recent addition of abx. Per MD for edema of right leg and adjustment of pain medication.  Patinet is mod I for sit - stand and able to ambulate up to 10' using a RW with step -2 gait.    Needs supervision - mod I for ADLs and toileting.Family education completed with spouse; confirmed ramp installment completed, just need temporary ramp added to get over threshold. [facility] and Wheelchair and [facility]- [facility] ordered with [employer].  Non emergent transportation to home arranged with [facility] for Friday @ 0930. No other concerns or issues noted, ready for discharge. Fredericka Barnie NOVAK

## 2024-06-25 NOTE — Plan of Care (Signed)
  Problem: RH Balance Goal: LTG Patient will maintain dynamic sitting balance (PT) Description: LTG:  Patient will maintain dynamic sitting balance with assistance during mobility activities (PT) Outcome: Completed/Met   Problem: Sit to Stand Goal: LTG:  Patient will perform sit to stand with assistance level (PT) Description: LTG:  Patient will perform sit to stand with assistance level (PT) Outcome: Completed/Met   Problem: RH Bed Mobility Goal: LTG Patient will perform bed mobility with assist (PT) Description: LTG: Patient will perform bed mobility with assistance, with/without cues (PT). Outcome: Completed/Met   Problem: RH Bed to Chair Transfers Goal: LTG Patient will perform bed/chair transfers w/assist (PT) Description: LTG: Patient will perform bed to chair transfers with assistance (PT). Outcome: Completed/Met   Problem: RH Ambulation Goal: LTG Patient will ambulate in controlled environment (PT) Description: LTG: Patient will ambulate in a controlled environment, # of feet with assistance (PT). Outcome: Completed/Met   Problem: RH Car Transfers Goal: LTG Patient will perform car transfers with assist (PT) Description: LTG: Patient will perform car transfers with assistance (PT). Outcome: Completed/Met

## 2024-06-25 NOTE — Progress Notes (Signed)
 Physical Therapy Discharge Summary  Patient Details  Name: Shirley Sullivan MRN: [id number] Date of Birth: 1961/06/21  Date of Discharge from PT service:June 25, 2024  Today's Date: 06/25/2024 PT Individual Time: [phone], 8584-8556 PT Individual Time Calculation (min): 72 min, 28 min PT missed time: 47 min (pt fatigue)  Patient has met 6 of 6 long term goals due to improved activity tolerance, improved balance, increased strength, increased range of motion, decreased pain, ability to compensate for deficits, improved attention, improved awareness, and improved coordination.  Patient to discharge at a wheelchair level with short distance ambulation with RW and CGA.   Patient's care partner is independent to provide the necessary physical assistance at discharge.  Recommendation:  Patient will benefit from ongoing skilled PT services in home health setting to continue to advance safe functional mobility, address ongoing impairments in strength/ROM/balance/endurance/gait, and minimize fall risk.  Equipment: WC, BSC pt has RW at home. Pt installed ramp for home entry, non emergency medical transport scheduled to take pt home at 1000 7/25.   Reasons for discharge: treatment goals met and discharge from hospital  Patient/family agrees with progress made and goals achieved: Yes  PT Discharge Precautions/Restrictions Precautions Precautions: Fall Recall of Precautions/Restrictions: Intact Precaution/Restrictions Comments: pt able to provide teach back of precautions Restrictions Weight Bearing Restrictions Per Provider Order: Yes RLE Weight Bearing Per Provider Order: Non weight bearing Other Position/Activity Restrictions: NWB RLE Pain Interference Pain Interference Pain Effect on Sleep: 2. Occasionally Pain Interference with Therapy Activities: 1. Rarely or not at all Pain Interference with Day-to-Day Activities: 1. Rarely or not at all Vision/Perception  Vision - History Ability to  See in Adequate Light: 0 Adequate Perception Perception: Within Functional Limits Praxis Praxis: WFL  Cognition Overall Cognitive Status: Within Functional Limits for tasks assessed Arousal/Alertness: Awake/alert Orientation Level: Oriented X4 Memory: Appears intact Awareness: Appears intact Problem Solving: Appears intact Safety/Judgment: Appears intact Sensation Sensation Light Touch: Impaired by gross assessment Additional Comments: B LE neuropathy at baseline, otherwise light touch in tact Coordination Gross Motor Movements are Fluid and Coordinated: No Fine Motor Movements are Fluid and Coordinated: Yes Coordination and Movement Description: R LE NWBing Motor  Motor Motor: Other (comment) Motor - Skilled Clinical Observations: generalized debility  Mobility Bed Mobility Bed Mobility: Rolling Left;Sit to Supine;Supine to Sit;Scooting to [facility] Rolling Left: Independent with assistive device Supine to Sit: Independent with assistive device Sit to Supine: Independent with assistive device Transfers Transfers: Sit to Stand;Stand to Sit;Stand Pivot Transfers Sit to Stand: Independent with assistive device Stand to Sit: Independent with assistive device Stand Pivot Transfers: Supervision/Verbal cueing Stand Pivot Transfer Details: Verbal cues for safe use of DME/AE Transfer (Assistive device): Rolling walker (or use of bed rail/arm rest of WC) Locomotion  Gait Ambulation: Yes Gait Assistance: Contact Guard/Touching assist Gait Distance (Feet): 10 Feet Assistive device: Rolling walker Gait Gait: Yes Gait Pattern: Impaired (hop to gait) Gait velocity: decreased Stairs / Additional Locomotion Stairs: No Wheelchair Mobility Wheelchair Mobility: Yes Wheelchair Assistance: Independent with assistive device Wheelchair Propulsion: Both upper extremities;Left lower extremity Wheelchair Parts Management: Supervision/cueing Distance: 300+  Trunk/Postural Assessment  Cervical  Assessment Cervical Assessment: Exceptions to [facility] (forward head) Thoracic Assessment Thoracic Assessment: Exceptions to WFL (slight kyphosis) Lumbar Assessment Lumbar Assessment: Within Functional Limits Postural Control Postural Control: Within Functional Limits  Balance Balance Balance Assessed: Yes Static Sitting Balance Static Sitting - Balance Support: Feet supported Static Sitting - Level of Assistance: 6: Modified independent (Device/Increase time) Dynamic Sitting Balance Dynamic Sitting -  Balance Support: Feet supported;During functional activity Dynamic Sitting - Level of Assistance: 6: Modified independent (Device/Increase time) Static Standing Balance Static Standing - Balance Support: Right upper extremity supported;Left upper extremity supported Static Standing - Level of Assistance: 6: Modified independent (Device/Increase time) Dynamic Standing Balance Dynamic Standing - Balance Support: During functional activity;Right upper extremity supported;Left upper extremity supported Dynamic Standing - Level of Assistance: 5: Stand by assistance (supervision/CGA) Extremity Assessment  RLE Assessment RLE Assessment: Exceptions to [facility] Passive Range of Motion (PROM) Comments: empty end feel with passive R LE knee extension seated EOB Active Range of Motion (AROM) Comments: active knee extension ROM limited sitting EOB 2/2 pain/weakness/edema; seated EOB knee extension ~50 deg supine knee extension lacking ~5 deg; knee flexion seated EOB ~95 deg, knee flexion while supine ~45 deg. General Strength Comments: grossly 2/5 LLE Assessment LLE Assessment: Exceptions to [facility] General Strength Comments: grossly 4-/5   Treatment Session 1   Pt supine in bed upon arrival. Pt agreeable to therapy. Pt reports 7/10, requesting pain medicine, notified nurse.   Discharge assessment performed, see above. Pt denies any questions/concerns with discharge home.   Pt provided teach back of  methods to reduce fall risk. Education provided regarding what to do in the instance of a fall.   Pt reports she lost her HEP, requesting additional copy. Reviewed and performed HEP, handout provided.   Ankle pumps  Quad sets with towel roll under ankle   Glute sets  SLR  Supine hip abduction  Heel slides  Pt overall demonstrates significantly improved ROM, pt currently able to tolerate 5-7 reps at a time actively, and then uses gait belt to perform remaining reps active assisted.   Pt navigated throughout room in [facility] level with mod I to pack up personal items for discharge home. Pt required intermittent supervision for safety with reaching to grab items with emphasis on locking brakes prior to reaching. Pt demonstrates improved carry over throughout session.   Pt performed stand pivot transfer WC to bed with supervision, verbal cues provided for sequencing as pt attempting to perform 180 deg turn versus 90 deg.  Pt supine in bed with all needs within reach and bed alarm on.   Treatment Session 2   Pt supine in bed upon arrival. Pt agreeable to therapy. Pt denies any pain.   DME not yet delivered to room. Followed up with Child psychotherapist. PT provided pt with information to pay co-pay. Pt contacted [employer] during session to pay co-pay.   Discussed safety with mobility upon discharge. Pt highly emphasized importance of pt taking her time, locking her brakes and think through the steps for pt overall safety. Pt verbalized understanding and agreeable and endorses I do not have any apprehension about going home, I feel very prepared.   Pt missed 47 minutes 2/2 fatigue.      [facility] [place], [place], DPT  06/25/2024, 8:09 AM

## 2024-06-25 NOTE — Progress Notes (Signed)
 PROGRESS NOTE   Subjective/Complaints:  Leg swelling improving. She is looking forward to going home. No new complaints this AM.  Pain controlled with current regimen.    ROS: Patient denies new vision changes,  vomiting, diarrhea,  shortness of breath or chest pain, headache, or mood change. + nausea-improved + R leg swelling- improved  + rash- pruritic around her back- improved    Objective:   DG Tibia/Fibula Right Result Date: 06/23/2024 CLINICAL DATA:  Lower extremity pain EXAM: RIGHT TIBIA AND FIBULA - 2 VIEW COMPARISON:  06/12/2024 FINDINGS: Plate fixation tibial plateau fracture. Fracture of proximal fibula again noted. No distal fracture evident. IMPRESSION: 1. No acute fracture identified. 2. Proximal fibular fracture noted. 3. Stable internal fixation of the tibial plateau fracture. Electronically Signed   By: Jackquline Boxer M.D.   On: 06/23/2024 16:00     Recent Labs    06/25/24 0523  WBC 3.6*  HGB 10.2*  HCT 31.2*  PLT 234    Recent Labs    06/25/24 0523  NA 138  K 3.6  CL 103  CO2 27  GLUCOSE 122*  BUN 15  CREATININE 0.61  CALCIUM 9.1     Intake/Output Summary (Last 24 hours) at 06/25/2024 0948 Last data filed at 06/25/2024 9147 Gross per 24 hour  Intake 360 ml  Output --  Net 360 ml        Physical Exam: Vital Signs Blood pressure 129/65, pulse 81, temperature 98.2 F (36.8 C), resp. rate 18, height 5' 4 (1.626 m), weight 84.1 kg, SpO2 99%.   General: No acute distress Mood and affect are appropriate Heart: Regular rate and rhythm no rubs murmurs or extra sounds Lungs: Clear to auscultation, breathing unlabored, no rales or wheezes Abdomen: Positive bowel sounds, soft nontender to palpation, nondistended Extremities: No clubbing, cyanosis Right lower extremity edema 1+ Ace wrap on RLE Skin: No evidence of breakdown, rash with small erythematous papules on lower back- improved    Neuro: Alert and oriented x 4, follows commands, no memory deficits noted, no speech or language deficits noted, cranial nerves II through XII grossly intact,   MMT: BUE 5/5. RLE 2/5 HF, KE d/t pain. 5/5 RADF/PF.  LLE 4 to 5/5 prox to distal. Sensory exam normal for light touch and pain in all 4 limbs.   No limb ataxia or cerebellar signs. No abnormal tone appreciated.    Prior neuro assessment is c/w today's exam 06/25/2024.   MSK:  Right knee swollen, appropriately tender     Assessment/Plan: 1. Functional deficits which require 3+ hours per day of interdisciplinary therapy in a comprehensive inpatient rehab setting. Physiatrist is providing close team supervision and 24 hour management of active medical problems listed below. Physiatrist and rehab team continue to assess barriers to discharge/monitor patient progress toward functional and medical goals  Care Tool:  Bathing    Body parts bathed by patient: Right arm, Right lower leg, Left arm, Left upper leg, Chest, Left lower leg, Abdomen, Front perineal area, Buttocks, Right upper leg, Face     Body parts n/a: Buttocks, Right lower leg, Left lower leg   Bathing assist Assist Level: Moderate Assistance - Patient  50 - 74%     Upper Body Dressing/Undressing Upper body dressing   What is the patient wearing?: Pull over shirt    Upper body assist Assist Level: Set up assist    Lower Body Dressing/Undressing Lower body dressing      What is the patient wearing?: Pants, Underwear/pull up     Lower body assist Assist for lower body dressing: Moderate Assistance - Patient 50 - 74%     Toileting Toileting    Toileting assist Assist for toileting: Moderate Assistance - Patient 50 - 74%     Transfers Chair/bed transfer  Transfers assist     Chair/bed transfer assist level: Supervision/Verbal cueing     Locomotion Ambulation   Ambulation assist   Ambulation activity did not occur: Safety/medical concerns  (dizziness/lightheadedness)  Assist level: Contact Guard/Touching assist Assistive device: Walker-rolling Max distance: 10 feet   Walk 10 feet activity   Assist  Walk 10 feet activity did not occur: Safety/medical concerns (fatigue)  Assist level: Contact Guard/Touching assist Assistive device: Walker-rolling   Walk 50 feet activity   Assist Walk 50 feet with 2 turns activity did not occur: Safety/medical concerns (fatigue/pain)         Walk 150 feet activity   Assist Walk 150 feet activity did not occur: Safety/medical concerns (pain/fatigue)         Walk 10 feet on uneven surface  activity   Assist Walk 10 feet on uneven surfaces activity did not occur: Safety/medical concerns (pain/fatigue)         Wheelchair     Assist Is the patient using a wheelchair?: Yes Type of Wheelchair: Manual    Wheelchair assist level: Independent Max wheelchair distance: 300+    Wheelchair 50 feet with 2 turns activity    Assist        Assist Level: Independent   Wheelchair 150 feet activity     Assist      Assist Level: Independent   Blood pressure 129/65, pulse 81, temperature 98.2 F (36.8 C), resp. rate 18, height 5' 4 (1.626 m), weight 84.1 kg, SpO2 99%.  Medical Problem List and Plan: 1. Functional deficits secondary to right proximal tibial fracture.  Status post ORIF 06/12/2024 per Dr. Kendal.               -TDWB RLE per Dr. Kendal op-note, NWB in later note.  Continue nonweightbearing for now and will check with Ortho - no ROM restriction or brace, will keep NWB for now             -ACE wrap for support of Right knee             -patient may  shower             -ELOS/Goals: 7-10 days, mod I goals  - Expected discharge 06/27/2019 plan  - Continue CIR  - R tib fib xray stable- reviewed with PT  --Pt with some blanchable erythema on RLE, Nausea with doxy in the past, will start kelfex for possible infection  -DC home tomorrow    2.   Antithrombotics: -DVT/anticoagulation:  Pharmaceutical: Lovenox  40mg  Cocke every day , Normal vascular study RLE.  Transition to aspirin  325 mg daily x 4 weeks outpatient on discharge             -antiplatelet therapy: N/A 3. Pain Management: Neurontin  400 mg 3 times daily, Robaxin  and oxycodone  as needed             -  pain fairly well controlled, taking 60mg  oxy IR per day, may benefit from taper PTA    - 7/22 continue current regimen of pain medications for now 4. Mood/Behavior/Sleep: Zoloft  100 mg daily             -antipsychotic agents:   -Pt uses PRN zanax at home, will start 0.5mg  BID PRN 5. Neuropsych/cognition: This patient is capable of making decisions on her own behalf. 6. Skin/Wound Care: Routine skin checks 7. Fluids/Electrolytes/Nutrition: Routine in and outs with follow-up chemistries 8.  Acute blood loss anemia.  Follow-up CBC  -7/15 Hemoglobin a little lower at 10.0, continue to monitor trend  -7/21 hemoglobin down to 9.8, will check occult stool-pending  -Will discuss with nursing stool test  -7/24 HGB better today 10.2, will check with nursing again today 9.  Hypertension.  Cozaar  25 mg daily.  Monitor with increased mobility  - BP controlled continue to monitor  -Decreased cozaar  to 12.5 mg due to [facility], see #12  - 7/17 BP a little bit higher today, continue current regimen for now and monitor  -7/18 BP intermittently a little bit elevated, could consider going back up on Cozaar  if this continues.  Or could  consider mild diuretic see #13. will recheck orthostatic vital signs.  -7/21-24 BP stable continue current regimen     06/25/2024    5:29 AM 06/24/2024    7:47 PM 06/24/2024    2:48 PM  Vitals with BMI  Systolic 129 130 871  Diastolic 65 64 61  Pulse 81 95 86    10.  Diabetes mellitus with peripheral neuropathy.  Currently on SSI.  Patient on Glucophage 1000 mg twice daily prior to admission.  Resume as needed  - 7/16 patient requests regular diet, will change per  her request  -7/24 remains well controlled   CBG (last 3)  Recent Labs    06/24/24 1656 06/24/24 2046 06/25/24 0535  GLUCAP 104* 141* 117*     11. Mildly elevated AST  -7/21 improved   12.  Orthostatic hypotension  - Patient has compression stocking to left leg, add abdominal binder.  Will decrease Cozaar  to 12.5 mg  -04/18/24 improved today, continue oral fluid intake and abdominal binder/compression.  Continue current regimen for now and monitor  -5/18 will recheck orthostatic VS, overall symptoms have improved.  She reports occasional brief dizziness but this potentially may not be [place] related  Improved  13. RLE edema  vascular ultrasound negative for DVT , discussed leg elevation when in bed, could consider mild diuretic  -Will start ace wrap, repeat xray- Improved    14. Rash to back.  Possibly contact dermatitis.  Continue to monitor  - Will start hydrocortisone  cream twice a day  -Improving continue to monitor    LOS: 10 days A FACE TO FACE EVALUATION WAS PERFORMED  Murray Collier 06/25/2024, 9:48 AM

## 2024-06-25 NOTE — Progress Notes (Addendum)
 Inpatient Rehabilitation Discharge Medication Review by a Pharmacist  A complete drug regimen review was completed for this patient to identify any potential clinically significant medication issues.   High Risk Drug Classes Is patient taking? Indication by Medication  Antipsychotic No    Anticoagulant No   Antibiotic Yes  Cephalexin  x 4 days-possible RLE infection  Opioid Yes Oxycodone  - pain  Antiplatelet Yes  Aspirin  - DVT prophylaxis x 30 days thru 07/27/24.  Hypoglycemics/insulin  No   Vasoactive Medication Yes Losartan  - HTN  Chemotherapy No    Other Yes Acetaminophen , lidoderm  patch - pain Alprazolam  - anxiety Gabapentin  - peripheral neuropathy Cetirizine - allergies Methocarbamol - muscle spasms Miralax , docusate - constipation Ondansetron - N/V Sertraline  - depression Vitamin D - supplement        Type of Medication Issue Identified Description of Issue Recommendation(s)  Drug Interaction(s) (clinically significant)        Duplicate Therapy        Allergy        No Medication Administration End Date        Incorrect Dose        Additional Drug Therapy Needed        Significant med changes from prior encounter (inform family/care partners about these prior to discharge). PTA medications: Alendronate (Fosamax), metformin, bentyl, and bactroban ointment were discontinued    Losartan  dose decreased from  25mg  daily PTA to 12.5 mg daily due to orthostatic hypotension. Alprazolam  dose changed to 0.5mg  BID PRN anxiety. Communicate any medication changes to patient /family/ caregiver prior to discharge and per instructions on the discharge AVS.    CIR MD noted to hold Fosamax until follow-up with orthopedic services.    Other            Clinically significant medication issues were identified that warrant physician communication and completion of prescribed/recommended actions by midnight of the next day:  No   Name of provider notified for urgent issues identified:     Provider Method of Notification:      Pharmacist comments:    Time spent performing this drug regimen review (minutes): 25   Levorn Gaskins, RPh Clinical Pharmacist 06/25/2024 12:05 PM

## 2024-06-25 NOTE — Progress Notes (Signed)
 Inpatient Rehabilitation Care Coordinator Discharge Note   Patient Details  Name: Shirley Sullivan MRN: [id number] Date of Birth: [birth year]   Discharge location: Home  Length of Stay: 9 days  Discharge activity level: Patient to discharge at overall Supervision (for standing and hopping with RW) and MOD I from wheelchair level.  Home/community participation: Limited  Patient response un:Yzjouy Literacy - How often do you need to have someone help you when you read instructions, pamphlets, or other written material from your doctor or pharmacy?: Never  Patient response un:Dnrpjo Isolation - How often do you feel lonely or isolated from those around you?: Never  Services provided included: MD, PT, OT, RN, CM, Pharmacy, SW  Financial Services:  Financial Services Utilized: Medicare    Choices offered to/list presented to: Yes  Follow-up services arranged:  Home Health Home Health Agency: [facility]         Patient response to transportation need: Is the patient able to respond to transportation needs?: Yes In the past 12 months, has lack of transportation kept you from medical appointments or from getting medications?: No In the past 12 months, has lack of transportation kept you from meetings, work, or from getting things needed for daily living?: No   Patient/Family verbalized understanding of follow-up arrangements:  Yes  Individual responsible for coordination of the follow-up plan: Patient and spouse Charlie :[phone]  Confirmed correct DME delivered: Wheelchair and DA-BSC; delivery held for copay.  Patient aware of co-pay.  Comments (or additional information):Ramp installation to porch completed; awaiting temporary ramp for threshold.  Summary of Stay    Date/Time Discharge Planning CSW  06/23/24 [phone] Home with husband (retired) . Ramp for entry to home (2 ste) installed. [facility] follow up order for PT/OT and DME ordered via [employer]. DBS  06/17/24 0954 Home  with husband who is retired and able to assist her if needed. Pt has had two falls in last 6 weeks. Installing ramp on stairs into home. Will await team's recommendations RGD       Fredericka Barnie NOVAK

## 2024-06-25 NOTE — Progress Notes (Signed)
 Occupational Therapy Discharge Summary  Patient Details  Name: Shirley Sullivan MRN: [id number] Date of Birth: 11/20/1961  Date of Discharge from OT service:June 25, 2024  Today's Date: 06/25/2024 OT Individual Time: 8952-8856 OT Individual Time Calculation (min): 56 min    Patient has met 11 of 11 long term goals due to improved activity tolerance, improved balance, postural control, ability to compensate for deficits, improved awareness, and improved coordination.  Patient to discharge at overall Supervision (for standing and hopping with RW) and MOD I from wheelchair level.  Patient's care partner is independent to provide the necessary physical assistance at discharge.  Pt to DC home with spouse and son who can provide some Supervision.  Reasons goals not met: NA  Recommendation:  Patient will benefit from ongoing skilled OT services in home health setting to continue to advance functional skills in the area of BADL, iADL, and Reduce care partner burden.  Equipment: Wheelchair, pt already has shower chair and TTB  Reasons for discharge: treatment goals met and discharge from hospital   Patient/family agrees with progress made and goals achieved: Yes   Skilled Interventions: Pt seen this date bed level at time of session resting, 6/10 pain at rest but states this is typical and pt had pain meds this AM. Pt seen for grad day OT session, declined bathing/dressing but able to verbally sequence and problem solve for home as well as simulated putting on pants, washing LB, etc. Recommended TTB for tub at home given the pt wants to use shower seat. BSC transfer bed <> BSC with MOD I, 3/3 toileting same manner. Simulated for home hopping to bathroom too as pt states she may have to do this with Supervision for toilet transfer. Extensive answering pt questions regarding DC planning, OT follow up, etc. Back in bed all needs met call bell in reach.    OT Discharge Precautions/Restrictions   Precautions Precautions: Fall Recall of Precautions/Restrictions: Intact Precaution/Restrictions Comments: pt able to provide teach back of precautions Restrictions Weight Bearing Restrictions Per Provider Order: Yes RLE Weight Bearing Per Provider Order: Non weight bearing Other Position/Activity Restrictions: NWB RLE Pain Pain Assessment Pain Scale: 0-10 Pain Score: 6  Pain Location: Leg Pain Intervention(s): Medication (See eMAR) ADL ADL Eating: Independent Where Assessed-Eating: Wheelchair Grooming: Modified independent Where Assessed-Grooming: Sitting at sink Upper Body Bathing: Setup Where Assessed-Upper Body Bathing: Sitting at sink Lower Body Bathing: Supervision/safety Where Assessed-Lower Body Bathing: Wheelchair Upper Body Dressing: Setup Where Assessed-Upper Body Dressing: Wheelchair Lower Body Dressing: Supervision/safety Where Assessed-Lower Body Dressing: Edge of bed Toileting: Supervision/safety Where Assessed-Toileting: Teacher, adult education: Distant supervision Statistician Method: Surveyor, minerals: Raised Copy: Moderate assistance, Minimal cueing Film/video editor Method: Information systems manager with back Vision Baseline Vision/History: 1 Wears glasses;4 Cataracts Patient Visual Report: No change from baseline Vision Assessment?: No apparent visual deficits Perception  Perception: Within Functional Limits Praxis Praxis: WFL Cognition Cognition Overall Cognitive Status: Within Functional Limits for tasks assessed Arousal/Alertness: Awake/alert Memory: Appears intact Awareness: Appears intact Problem Solving: Appears intact Behaviors: Impulsive Safety/Judgment: Appears intact Brief Interview for Mental Status (BIMS) Repetition of Three Words (First Attempt): 3 Temporal Orientation: Year: Correct Temporal Orientation: Month: Accurate within 5 days Temporal  Orientation: Day: Correct Recall: Sock: Yes, no cue required Recall: Blue: Yes, no cue required Recall: Bed: Yes, no cue required BIMS Summary Score: 15 Sensation Sensation Light Touch: Impaired by gross assessment Additional Comments: B LE neuropathy at  baseline, otherwise light touch in tact Coordination Gross Motor Movements are Fluid and Coordinated: No Fine Motor Movements are Fluid and Coordinated: Yes Coordination and Movement Description: R LE NWBing Motor  Motor Motor: Other (comment) Motor - Skilled Clinical Observations: generalized debility Mobility  Bed Mobility Bed Mobility: Rolling Left;Sit to Supine;Supine to Sit;Scooting to [facility] Rolling Left: Independent with assistive device Supine to Sit: Independent with assistive device Sit to Supine: Independent with assistive device Transfers Sit to Stand: Independent with assistive device Stand to Sit: Independent with assistive device  Trunk/Postural Assessment  Cervical Assessment Cervical Assessment: Exceptions to [facility] Thoracic Assessment Thoracic Assessment: Exceptions to [facility] Lumbar Assessment Lumbar Assessment: Within Functional Limits Postural Control Postural Control: Within Functional Limits  Balance Balance Balance Assessed: Yes Static Sitting Balance Static Sitting - Balance Support: Feet supported Static Sitting - Level of Assistance: 6: Modified independent (Device/Increase time) Dynamic Sitting Balance Dynamic Sitting - Balance Support: Feet supported;During functional activity Dynamic Sitting - Level of Assistance: 6: Modified independent (Device/Increase time) Dynamic Sitting - Balance Activities: Lateral lean/weight shifting;Forward lean/weight shifting;Reaching for objects;Reaching across midline Static Standing Balance Static Standing - Balance Support: Right upper extremity supported;Left upper extremity supported Static Standing - Level of Assistance: 6: Modified independent  (Device/Increase time) Dynamic Standing Balance Dynamic Standing - Balance Support: During functional activity;Right upper extremity supported;Left upper extremity supported Dynamic Standing - Level of Assistance: 5: Stand by assistance Extremity/Trunk Assessment RUE Assessment RUE Assessment: Exceptions to [facility] Active Range of Motion (AROM) Comments: WFL General Strength Comments: weakness, pt states old RCT and repair, thinks she may have hurt it in fall - unsure LUE Assessment LUE Assessment: Within Functional Limits   Chiquita JAYSON Hopping 06/25/2024, 11:55 AM

## 2024-06-26 ENCOUNTER — Other Ambulatory Visit (HOSPITAL_COMMUNITY): Payer: Self-pay

## 2024-06-26 DIAGNOSIS — R600 Localized edema: Secondary | ICD-10-CM | POA: Diagnosis not present

## 2024-06-26 DIAGNOSIS — I10 Essential (primary) hypertension: Secondary | ICD-10-CM | POA: Diagnosis not present

## 2024-06-26 DIAGNOSIS — S82101D Unspecified fracture of upper end of right tibia, subsequent encounter for closed fracture with routine healing: Secondary | ICD-10-CM | POA: Diagnosis not present

## 2024-06-26 DIAGNOSIS — E1142 Type 2 diabetes mellitus with diabetic polyneuropathy: Secondary | ICD-10-CM | POA: Diagnosis not present

## 2024-06-26 DIAGNOSIS — M79604 Pain in right leg: Secondary | ICD-10-CM

## 2024-06-26 LAB — GLUCOSE, CAPILLARY: Glucose-Capillary: 115 mg/dL — ABNORMAL HIGH (ref 70–99)

## 2024-06-26 NOTE — Progress Notes (Signed)
 PROGRESS NOTE   Subjective/Complaints:  Leg swelling has improved.  She is excited to go home.  No new complaint or concern.  She is happy with her progress with therapy.   ROS: Patient denies new vision changes,  vomiting, diarrhea,  shortness of breath or chest pain, headache, or mood change. + nausea-improved + R leg swelling- improved  + rash- pruritic around her back- improved    Objective:   No results found.    Recent Labs    06/25/24 0523  WBC 3.6*  HGB 10.2*  HCT 31.2*  PLT 234    Recent Labs    06/25/24 0523  NA 138  K 3.6  CL 103  CO2 27  GLUCOSE 122*  BUN 15  CREATININE 0.61  CALCIUM 9.1     Intake/Output Summary (Last 24 hours) at 06/26/2024 1712 Last data filed at 06/26/2024 0700 Gross per 24 hour  Intake 598 ml  Output --  Net 598 ml        Physical Exam: Vital Signs Blood pressure 131/71, pulse 82, temperature 97.9 F (36.6 C), temperature source Oral, resp. rate 18, height 5' 4 (1.626 m), weight 84.1 kg, SpO2 98%.   General: No acute distress Mood and affect are appropriate Heart: Regular rate and rhythm no rubs murmurs or extra sounds Lungs: Clear to auscultation, breathing unlabored, no rales or wheezes Abdomen: Positive bowel sounds, soft nontender to palpation, nondistended Extremities: No clubbing, cyanosis Right lower extremity edema trace to 1+  Skin: No evidence of breakdown Incision CDI  Neuro: Alert and oriented x 4, follows commands, no memory deficits noted, no speech or language deficits noted, cranial nerves II through XII grossly intact,   MMT: BUE 5/5. RLE 2/5 HF, KE d/t pain. 5/5 RADF/PF.  LLE 4 to 5/5 prox to distal. Sensory exam normal for light touch and pain in all 4 limbs.   No limb ataxia or cerebellar signs. No abnormal tone appreciated.    Prior neuro assessment is c/w today's exam 06/26/2024.   MSK:  Right knee swollen, appropriately  tender     Assessment/Plan: 1. Functional deficits which require 3+ hours per day of interdisciplinary therapy in a comprehensive inpatient rehab setting. Physiatrist is providing close team supervision and 24 hour management of active medical problems listed below. Physiatrist and rehab team continue to assess barriers to discharge/monitor patient progress toward functional and medical goals  Care Tool:  Bathing    Body parts bathed by patient: Right arm, Right lower leg, Left arm, Left upper leg, Chest, Left lower leg, Abdomen, Front perineal area, Buttocks, Right upper leg, Face     Body parts n/a: Buttocks, Right lower leg, Left lower leg   Bathing assist Assist Level: Supervision/Verbal cueing     Upper Body Dressing/Undressing Upper body dressing   What is the patient wearing?: Pull over shirt    Upper body assist Assist Level: Independent with assistive device    Lower Body Dressing/Undressing Lower body dressing      What is the patient wearing?: Pants, Underwear/pull up     Lower body assist Assist for lower body dressing: Supervision/Verbal cueing     Toileting Toileting  Toileting assist Assist for toileting: Supervision/Verbal cueing (MOD I with bed <> BSC but supervision if hopping or using wheelchair to toilet)     Transfers Chair/bed transfer  Transfers assist     Chair/bed transfer assist level: Supervision/Verbal cueing     Locomotion Ambulation   Ambulation assist   Ambulation activity did not occur: Safety/medical concerns (dizziness/lightheadedness)  Assist level: Contact Guard/Touching assist Assistive device: Walker-rolling Max distance: 10 feet   Walk 10 feet activity   Assist  Walk 10 feet activity did not occur: Safety/medical concerns (fatigue)  Assist level: Contact Guard/Touching assist Assistive device: Walker-rolling   Walk 50 feet activity   Assist Walk 50 feet with 2 turns activity did not occur:  Safety/medical concerns (fatigue/pain)         Walk 150 feet activity   Assist Walk 150 feet activity did not occur: Safety/medical concerns (pain/fatigue)         Walk 10 feet on uneven surface  activity   Assist Walk 10 feet on uneven surfaces activity did not occur: Safety/medical concerns (pain/fatigue)         Wheelchair     Assist Is the patient using a wheelchair?: Yes Type of Wheelchair: Manual    Wheelchair assist level: Independent Max wheelchair distance: 300+    Wheelchair 50 feet with 2 turns activity    Assist        Assist Level: Independent   Wheelchair 150 feet activity     Assist      Assist Level: Independent   Blood pressure 131/71, pulse 82, temperature 97.9 F (36.6 C), temperature source Oral, resp. rate 18, height 5' 4 (1.626 m), weight 84.1 kg, SpO2 98%.  Medical Problem List and Plan: 1. Functional deficits secondary to right proximal tibial fracture.  Status post ORIF 06/12/2024 per Dr. Kendal.               -TDWB RLE per Dr. Kendal op-note, NWB in later note.  Continue nonweightbearing for now and will check with Ortho - no ROM restriction or brace, will keep NWB for now             -ACE wrap for support of Right knee             -patient may  shower             -ELOS/Goals: 7-10 days, mod I goals  - Expected discharge 06/27/2019 plan  - Continue CIR  - R tib fib xray stable- reviewed with PT  --Pt with some blanchable erythema on RLE, Nausea with doxy in the past, will start kelfex for possible infection  -DC home today  2.  Antithrombotics: -DVT/anticoagulation:  Pharmaceutical: Lovenox  40mg  [place] every day , Normal vascular study RLE.  Transition to aspirin  325 mg daily x 4 weeks outpatient on discharge             -antiplatelet therapy: N/A 3. Pain Management: Neurontin  400 mg 3 times daily, Robaxin  and oxycodone  as needed             -pain fairly well controlled, taking 60mg  oxy IR per day, may benefit from  taper PTA    - 7/25 pain gradually improving, oxycodone  dose was decreased to 5 to 10 mg and she has been tolerating this well, not needing as frequently 4. Mood/Behavior/Sleep: Zoloft  100 mg daily             -antipsychotic agents:   -Pt uses PRN zanax at home, will  start 0.5mg  BID PRN 5. Neuropsych/cognition: This patient is capable of making decisions on her own behalf. 6. Skin/Wound Care: Routine skin checks 7. Fluids/Electrolytes/Nutrition: Routine in and outs with follow-up chemistries 8.  Acute blood loss anemia.  Follow-up CBC  -7/15 Hemoglobin a little lower at 10.0, continue to monitor trend  -7/21 hemoglobin down to 9.8, will check occult stool-pending  -Will discuss with nursing stool test  -7/24 HGB better today 10.2 fecal occult test negative, overall stable continue follow-up with PCP for further monitoring 9.  Hypertension.  Cozaar  25 mg daily.  Monitor with increased mobility  - BP controlled continue to monitor  -Decreased cozaar  to 12.5 mg due to [facility], see #12  - 7/17 BP a little bit higher today, continue current regimen for now and monitor  -7/18 BP intermittently a little bit elevated, could consider going back up on Cozaar  if this continues.  Or could  consider mild diuretic see #13. will recheck orthostatic vital signs.  -7/21-25 BP stable continue current regimen     06/26/2024    5:26 AM 06/25/2024    7:24 PM 06/25/2024    1:21 PM  Vitals with BMI  Systolic 131 148 867  Diastolic 71 77 64  Pulse 82 87 86    10.  Diabetes mellitus with peripheral neuropathy.  Currently on SSI.  Patient on Glucophage 1000 mg twice daily prior to admission.  Resume as needed  - 7/16 patient requests regular diet, will change per her request  -7/25 controlled continue follow-up with PCP  CBG (last 3)  Recent Labs    06/25/24 1641 06/25/24 2043 06/26/24 0559  GLUCAP 106* 152* 115*     11. Mildly elevated AST  -7/21 improved   12.  Orthostatic hypotension  - Patient has  compression stocking to left leg, add abdominal binder.  Will decrease Cozaar  to 12.5 mg  -04/18/24 improved today, continue oral fluid intake and abdominal binder/compression.  Continue current regimen for now and monitor  -5/18 will recheck orthostatic VS, overall symptoms have improved.  She reports occasional brief dizziness but this potentially may not be [place] related  Improved  13. RLE edema  vascular ultrasound negative for DVT , discussed leg elevation when in bed, could consider mild diuretic  -Will start ace wrap, repeat xray- Improved   -Looks improved today, follow-up with orthopedics  14. Rash to back.  Possibly contact dermatitis.  Continue to monitor  - Will start hydrocortisone  cream twice a day  -Improving continue to monitor    LOS: 11 days A FACE TO FACE EVALUATION WAS PERFORMED  Murray Collier 06/26/2024, 5:12 PM

## 2024-08-07 NOTE — Progress Notes (Deleted)
 I saw Shirley Sullivan in neurology clinic on 08/21/24 in follow up for pain in both feet and imbalance when walking.  HPI: Shirley Sullivan is a 63 y.o. year old female with a history of HTN, DM, OA, migraine, depression, SLE, asthma, nephrolithiasis, CAD c/b MI, cirrhosis of liver c/b portal HTN, vit D deficiency who we last saw on 02/07/24.  To briefly review: 02/07/24: Patient was diagnosed with diabetes with 5 years ago. About 4 years ago, she started having pain in pinkie toe of right foot. She was started on gabapentin  by PCP. Now both feet hurt, but right more than left. She thinks the right foot looks more swollen than the left. For the last couple of years she has had difficulty walking. She went to PT (last 11/2023) and was told her left leg was shorter than her right and her hips were misaligned. She was much better in terms of walking after this, but her feet still hurt. She can still be off balance at times.    Her last fall was in 09/2023 when she fractured her tailbone. She has healed well. Previous fall was a couple years before that.   She continues to take gabapentin  400 mg TID. She is generally happy with this, but occasionally has severe pain at night.   She is on xanax  and sertraline  for anxiety and depression.   Patient has lupus, so she does not like heat. She does not sweat well, so she has difficulty with this. She denies excessive mucosal dryness, gastroparetic early satiety, postprandial abdominal bloating, constipation, bowel or bladder dyscontrol, or syncope/presyncope/orthostatic intolerance   She does not report any constitutional symptoms like fever, night sweats, anorexia or unintentional weight loss.   EtOH use: None  Restrictive diet? No Family history of neuropathy/myopathy/neurologic disease? No  Most recent Assessment and Plan (02/07/24): Shirley Sullivan is a 63 y.o. female who presents for evaluation of pain in both feet and imbalance when walking. She has a  relevant medical history of HTN, DM, OA, migraine, depression, SLE, asthma, nephrolithiasis, CAD c/b MI, cirrhosis of liver c/b portal HTN, vit D deficiency. Her neurological examination is pertinent for mild gait imbalance. Available diagnostic data is significant for HbA1c 6.0 in 01/2024 per patient. The etiology of symptoms is currently unclear. 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 I will work up as below.   PLAN: -Blood work: B1, B12, IFE -EMG: RLE and maybe LLE vs RUE -Alpha lipoic acid 600 mg once or twice daily -Lidocaine  cream PRN -Gabapentin  400 mg TID, can take an extra 400 mg at bedtime if needed  Since their last visit: Labs were significant for low B1 and borderline low B12. I recommended she supplement both. 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 There was no evidence of radiculopathy.  Patient had a fall with right tibia and fibula fracture and was admitted from 06/11/24 to 06/16/24 for surgery by ortho.***  Symptoms? Medications?  ROS: Pertinent positive and negative systems reviewed in HPI. ***   MEDICATIONS:  Outpatient Encounter Medications as of 08/21/2024  Medication Sig   acetaminophen  (TYLENOL ) 325 MG tablet Take 1-2 tablets (325-650 mg total) by mouth every 4 (four) hours as needed for mild pain (pain score 1-3).   ALPRAZolam  (XANAX ) 0.5 MG  tablet Take 1 tablet (0.5 mg total) by mouth 2 (two) times daily as needed for anxiety.   cephALEXin  (KEFLEX ) 500 MG capsule Take 1 capsule (500 mg total) by mouth every 8 (eight) hours.   cetirizine  (ZYRTEC ) 10 MG tablet Take 1 tablet (10 mg total) by mouth at bedtime.   docusate sodium  (COLACE) 100 MG capsule Take 1 capsule (100 mg total) by mouth 2 (two) times daily.    gabapentin  (NEURONTIN ) 400 MG capsule Take 400 mg by mouth three times per day, can take an extra 400 mg at bedtime if needed.   lidocaine  (LIDODERM ) 5 % Place 1 patch onto the skin daily. Remove & Discard patch within 12 hours or as directed by MD   losartan  (COZAAR ) 25 MG tablet Take 0.5 tablets (12.5 mg total) by mouth daily.   methocarbamol  (ROBAXIN ) 500 MG tablet Take 1 tablet (500 mg total) by mouth every 6 (six) hours as needed.   ondansetron  (ZOFRAN -ODT) 4 MG disintegrating tablet Take 1 tablet (4 mg total) by mouth every 8 (eight) hours as needed for nausea or vomiting.   oxyCODONE  (OXY IR/ROXICODONE ) 5 MG immediate release tablet Take 1 tablet (5 mg total) by mouth every 4 (four) hours as needed for moderate pain (pain score 4-6).   polyethylene glycol (MIRALAX  / GLYCOLAX ) 17 g packet Take 17 g by mouth daily as needed.   sertraline  (ZOLOFT ) 100 MG tablet Take 1 tablet (100 mg total) by mouth at bedtime.   Vitamin D , Ergocalciferol , (DRISDOL ) 1.25 MG (50000 UNIT) CAPS capsule Take 1 capsule (50,000 Units total) by mouth every 7 (seven) days.   No facility-administered encounter medications on file as of 08/21/2024.    PAST MEDICAL HISTORY: Past Medical History:  Diagnosis Date   Allergy    Anxiety    Arthritis    Asthma    perfumes triggers attacks. Has inhalers for rescue   COVID-19 10/09/2020   and 08/15/20   Depression    Diabetes mellitus without complication (HCC)    History of kidney stones 1998, 2015   Lupus    Septic shock (HCC)    Klebsiella oxytocin and Enterobacter cloacae bacteremia/UTI    PAST SURGICAL HISTORY: Past Surgical History:  Procedure Laterality Date   ABDOMINAL HYSTERECTOMY  2004   APPENDECTOMY  1992   CYSTOSCOPY WITH RETROGRADE PYELOGRAM, URETEROSCOPY AND STENT PLACEMENT Left 08/11/2014   Procedure: CYSTOSCOPY WITH RETROGRADE PYELOGRAM, URETEROSCOPY AND STENT PLACEMENT,  DIGITAL FLEXIBLE URETEROSCOPE;  Surgeon: Gretel Ferrara, MD;  Location: [facility];  Service: Urology;  Laterality: Left;  request digital flexible ureteroscope   CYSTOSCOPY WITH RETROGRADE PYELOGRAM, URETEROSCOPY AND STENT PLACEMENT Left 08/23/2014   Procedure: CYSTOSCOPY WITH RETROGRADE PYELOGRAM, URETEROSCOPY AND STENT EXCHANGE;  Surgeon: Gretel Ferrara, MD;  Location: [facility];  Service: Urology;  Laterality: Left;   CYSTOSCOPY/URETEROSCOPY/HOLMIUM LASER/STENT PLACEMENT Right 11/02/2020   Procedure: CYSTOSCOPY/URETEROSCOPY/HOLMIUM LASER/STENT PLACEMENT/ REMOVAL OF RIGHT NEPHROSTOMY TUBE;  Surgeon: Ferrara Gretel, MD;  Location: [facility];  Service: Urology;  Laterality: Right;  patient will not need to be covid tested, tested positive on 11/14/20 has proof   HOLMIUM LASER APPLICATION Left 08/23/2014   Procedure: HOLMIUM LASER APPLICATION;  Surgeon: Gretel Ferrara, MD;  Location: [facility];  Service: Urology;  Laterality: Left;   IR NEPHROSTOMY EXCHANGE RIGHT  10/17/2020   IR NEPHROSTOMY PLACEMENT RIGHT  10/10/2020   ORIF TIBIA FRACTURE Right 06/12/2024   Procedure: OPEN REDUCTION INTERNAL FIXATION (ORIF) TIBIA FRACTURE;  Surgeon: Kendal Franky SQUIBB, MD;  Location: [facility];  Service: Orthopedics;  Laterality: Right;   ROTATOR CUFF REPAIR Right 2007    ALLERGIES: Allergies  Allergen Reactions   Codeine Itching   Strawberry Extract Hives, Itching and Other (See Comments)    Can't breathe   Vibramycin  [Doxycycline  Calcium] Nausea And Vomiting    FAMILY HISTORY: Family History  Problem Relation Age of Onset   Hyperlipidemia Mother     SOCIAL HISTORY: Social History   Tobacco Use   Smoking status: Never   Smokeless tobacco: Never  Vaping Use   Vaping status: Never Used  Substance Use Topics   Alcohol use: No    Alcohol/week: 0.0 standard drinks of alcohol   Drug use: No   Social History   Social History Narrative   Are you right handed or left handed? Right   Are you currently employed ? no   What is your current occupation? no   Do you live at home alone?   Who  lives with you? family   What type of home do you live in: 1 story or 2 story? one   Caffiene 16 oz a daily     Objective:  Vital Signs:  There were no vitals taken for this visit.  General:*** General appearance: Awake and alert. No distress. Cooperative with exam.  Skin: No obvious rash or jaundice. HEENT: Atraumatic. Anicteric. Lungs: Non-labored breathing on room air  Heart: Regular Abdomen: Soft, non tender. Extremities: No edema. No obvious deformity.  Musculoskeletal: No obvious joint swelling.  Neurological: Mental Status: Alert. Speech fluent. No pseudobulbar affect Cranial Nerves: CNII: No RAPD. Visual fields intact. CNIII, IV, VI: PERRL. No nystagmus. EOMI. CN V: Facial sensation intact bilaterally to fine touch. [place] clench strong. Jaw jerk***. CN VII: Facial muscles symmetric and strong. No ptosis at rest or after sustained upgaze***. CN VIII: Hears finger rub well bilaterally. CN IX: No hypophonia. CN X: Palate elevates symmetrically. CN XI: Full strength shoulder shrug bilaterally. CN XII: Tongue protrusion full and midline. No atrophy or fasciculations. No significant dysarthria*** Motor: Tone is ***. *** fasciculations in *** extremities. *** atrophy. No grip or percussive myotonia.  Individual muscle group testing (MRC grade out of 5):  Movement     Neck flexion ***    Neck extension ***     Right Left   Shoulder abduction *** ***   Shoulder adduction *** ***   Shoulder ext rotation *** ***   Shoulder int rotation *** ***   Elbow flexion *** ***   Elbow extension *** ***   Wrist extension *** ***   Wrist flexion *** ***   Finger abduction - FDI *** ***   Finger abduction - ADM *** ***   Finger extension *** ***   Finger distal flexion - 2/3 *** ***   Finger distal flexion - 4/5 *** ***   Thumb flexion - FPL *** ***   Thumb abduction - APB *** ***    Hip flexion *** ***   Hip extension *** ***   Hip adduction *** ***   Hip abduction  *** ***   Knee extension *** ***   Knee flexion *** ***   Dorsiflexion *** ***   Plantarflexion *** ***   Inversion *** ***   Eversion *** ***   Great toe extension *** ***   Great toe flexion *** ***     Reflexes:  Right Left  Bicep *** ***  Tricep *** ***  BrRad *** ***  Knee *** ***  Ankle *** ***  Pathological Reflexes: Babinski: *** response bilaterally*** Hoffman: *** Troemner: *** Pectoral: *** Palmomental: *** Facial: *** Midline tap: *** Sensation: Pinprick: *** Vibration: *** Temperature: *** Proprioception: *** Coordination: Intact finger-to- nose-finger and heel-to-shin bilaterally. Romberg negative.*** Gait: Able to rise from chair with arms crossed unassisted. Normal, narrow-based gait. Able to tandem walk. Able to walk on toes and heels.***   Lab and Test Review: New results: 06/12/24: Vit D low at 8.28  02/07/24: IFE: no M protein B12: 220 B1: low at 6  EMG (03/09/24): NCV & EMG Findings: Extensive electrodiagnostic evaluation of the right lower limb with additional nerve conduction studies of the right upper limb and left lower limb shows: Bilateral sural and superficial peroneal/fibular sensory responses are absent. Right median and ulnar sensory responses are within normal limits. Bilateral tibial (AH) motor responses show reduced amplitude (L0.81, R1.21 mV). Right peroneal/fibular (EDB and TA) motor responses are within normal limits. Right H reflex is absent. Chronic motor axon loss changes without accompanying active denervation changes are seen in the right tibialis anterior and medial head of gastrocnemius muscles.   Impression: This is an abnormal study. The findings are most consistent with the following: Evidence of a length dependent, large fiber sensorimotor neuropathy, axon loss in type, mild to moderate in degree electrically. No definitive electrodiagnostic evidence of a right lumbosacral (L3-S1) motor radiculopathy.  Previously  reviewed results: HbA1c (10/10/20): 5.8 (most recent is 6 per patient about 01/2024)   Imaging/Procedures: MRI lumbar spine wo contrast (09/10/23 - external): FINDINGS:    Alignment: Mild anterolisthesis of L4 on L5.   Vertebrae: Vertebral body heights are maintained. No marrow signal abnormalities to suggest neoplasm.   Conus medullaris: In normal position. Normal signal and contour.   Degenerative changes:   T12-L1: Facet arthropathy without substantial canal or foraminal stenosis.  L1-L2: Facet arthropathy without substantial canal or foraminal stenosis.  L2-L3: Facet arthropathy and ligamentum flavum hypertrophy without substantial canal stenosis. Mild bilateral foraminal stenosis.  L3-L4: Broad-based disc bulge, facet arthropathy, and ligamentum flavum hypertrophy contributes to mild narrowing of the subarticular recesses. No substantial central zone stenosis. Mild left greater than right foraminal stenosis.  L4-L5: Broad-based disc bulge, facet arthropathy, and ligamentum flavum hypertrophy contributes to mild narrowing of the subarticular recesses. No substantial central zone stenosis. Mild left greater than right foraminal stenosis.  L5-S1: Broad-based disc bulge, facet arthropathy, and ligamentum flavum hypertrophy without substantial canal stenosis. Mild left greater than right foraminal stenosis.   Upper sacrum: No focal lesion identified.   Additional findings: Fatty atrophy of the visualized paraspinal and gluteal musculature. Small T2 hyperintense renal cyst.   IMPRESSION:  Mild multilevel lumbar spondylosis without high grade canal or foraminal stenosis within the lumbar spine. Level by level details as above.   ASSESSMENT: This is Hendricks JONETTA Custard, a 63 y.o. female with:  ***  Plan: ***  Return to clinic in ***  Total time spent reviewing records, interview, history/exam, documentation, and coordination of care on day of encounter:  *** min  Venetia Potters, MD

## 2024-08-21 ENCOUNTER — Ambulatory Visit: Admitting: Neurology

## 2025-01-14 ENCOUNTER — Ambulatory Visit: Admitting: Neurology

## 2025-04-30 ENCOUNTER — Ambulatory Visit: Admitting: Neurology
